# Patient Record
Sex: Female | Born: 1935
Health system: Southern US, Community
[De-identification: ages and names within clinical notes are randomized; demographics above are authoritative.]

## PROBLEM LIST (undated history)

## (undated) ENCOUNTER — Ambulatory Visit (HOSPITAL_COMMUNITY): Payer: Medicare Other | Source: Home / Self Care

## (undated) DIAGNOSIS — R011 Cardiac murmur, unspecified: Secondary | ICD-10-CM

## (undated) DIAGNOSIS — T7840XA Allergy, unspecified, initial encounter: Secondary | ICD-10-CM

## (undated) DIAGNOSIS — E559 Vitamin D deficiency, unspecified: Secondary | ICD-10-CM

## (undated) DIAGNOSIS — N189 Chronic kidney disease, unspecified: Secondary | ICD-10-CM

## (undated) DIAGNOSIS — G709 Myoneural disorder, unspecified: Secondary | ICD-10-CM

## (undated) DIAGNOSIS — M1990 Unspecified osteoarthritis, unspecified site: Secondary | ICD-10-CM

## (undated) DIAGNOSIS — I739 Peripheral vascular disease, unspecified: Secondary | ICD-10-CM

## (undated) DIAGNOSIS — K219 Gastro-esophageal reflux disease without esophagitis: Secondary | ICD-10-CM

## (undated) DIAGNOSIS — G4730 Sleep apnea, unspecified: Secondary | ICD-10-CM

## (undated) DIAGNOSIS — I10 Essential (primary) hypertension: Secondary | ICD-10-CM

## (undated) HISTORY — DX: Myoneural disorder, unspecified: G70.9

## (undated) HISTORY — PX: HEMORRHOID SURGERY: SHX153

## (undated) HISTORY — DX: Unspecified osteoarthritis, unspecified site: M19.90

## (undated) HISTORY — DX: Gastro-esophageal reflux disease without esophagitis: K21.9

## (undated) HISTORY — DX: Chronic kidney disease, unspecified: N18.9

## (undated) HISTORY — DX: Peripheral vascular disease, unspecified: I73.9

## (undated) HISTORY — PX: EYE SURGERY: SHX253

## (undated) HISTORY — DX: Vitamin D deficiency, unspecified: E55.9

## (undated) HISTORY — DX: Cardiac murmur, unspecified: R01.1

## (undated) HISTORY — DX: Sleep apnea, unspecified: G47.30

## (undated) HISTORY — DX: Allergy, unspecified, initial encounter: T78.40XA

## (undated) HISTORY — PX: ABDOMINAL HYSTERECTOMY: SHX81

---

## 1998-04-22 ENCOUNTER — Other Ambulatory Visit: Admission: RE | Admit: 1998-04-22 | Discharge: 1998-04-22 | Payer: Self-pay | Admitting: Gastroenterology

## 1998-07-09 ENCOUNTER — Ambulatory Visit (HOSPITAL_COMMUNITY): Admission: RE | Admit: 1998-07-09 | Discharge: 1998-07-09 | Payer: Self-pay | Admitting: Gastroenterology

## 2000-04-28 ENCOUNTER — Encounter: Payer: Self-pay | Admitting: *Deleted

## 2000-04-28 ENCOUNTER — Encounter: Admission: RE | Admit: 2000-04-28 | Discharge: 2000-04-28 | Payer: Self-pay | Admitting: *Deleted

## 2000-05-25 ENCOUNTER — Emergency Department (HOSPITAL_COMMUNITY): Admission: EM | Admit: 2000-05-25 | Discharge: 2000-05-25 | Payer: Self-pay | Admitting: Emergency Medicine

## 2000-10-27 ENCOUNTER — Encounter: Admission: RE | Admit: 2000-10-27 | Discharge: 2000-10-27 | Payer: Self-pay | Admitting: *Deleted

## 2000-10-27 ENCOUNTER — Encounter: Payer: Self-pay | Admitting: *Deleted

## 2001-05-30 ENCOUNTER — Encounter: Admission: RE | Admit: 2001-05-30 | Discharge: 2001-05-30 | Payer: Self-pay | Admitting: Internal Medicine

## 2001-05-30 ENCOUNTER — Encounter: Payer: Self-pay | Admitting: Internal Medicine

## 2001-11-27 ENCOUNTER — Ambulatory Visit (HOSPITAL_COMMUNITY): Admission: RE | Admit: 2001-11-27 | Discharge: 2001-11-27 | Payer: Self-pay | Admitting: Gastroenterology

## 2002-06-28 ENCOUNTER — Encounter: Payer: Self-pay | Admitting: Internal Medicine

## 2002-06-28 ENCOUNTER — Encounter: Admission: RE | Admit: 2002-06-28 | Discharge: 2002-06-28 | Payer: Self-pay | Admitting: Internal Medicine

## 2003-06-10 ENCOUNTER — Encounter: Payer: Self-pay | Admitting: Internal Medicine

## 2003-06-10 ENCOUNTER — Encounter: Admission: RE | Admit: 2003-06-10 | Discharge: 2003-06-10 | Payer: Self-pay | Admitting: Internal Medicine

## 2003-07-02 ENCOUNTER — Encounter: Admission: RE | Admit: 2003-07-02 | Discharge: 2003-07-02 | Payer: Self-pay | Admitting: Internal Medicine

## 2003-07-02 ENCOUNTER — Encounter: Payer: Self-pay | Admitting: Internal Medicine

## 2004-08-28 ENCOUNTER — Encounter: Admission: RE | Admit: 2004-08-28 | Discharge: 2004-08-28 | Payer: Self-pay | Admitting: Internal Medicine

## 2005-10-06 ENCOUNTER — Encounter: Admission: RE | Admit: 2005-10-06 | Discharge: 2005-10-06 | Payer: Self-pay | Admitting: Internal Medicine

## 2005-11-22 ENCOUNTER — Encounter: Admission: RE | Admit: 2005-11-22 | Discharge: 2005-11-22 | Payer: Self-pay | Admitting: Internal Medicine

## 2005-12-09 ENCOUNTER — Encounter: Admission: RE | Admit: 2005-12-09 | Discharge: 2005-12-09 | Payer: Self-pay | Admitting: Internal Medicine

## 2006-10-12 ENCOUNTER — Encounter: Admission: RE | Admit: 2006-10-12 | Discharge: 2006-10-12 | Payer: Self-pay | Admitting: Internal Medicine

## 2007-10-16 ENCOUNTER — Encounter: Admission: RE | Admit: 2007-10-16 | Discharge: 2007-10-16 | Payer: Self-pay | Admitting: Internal Medicine

## 2008-11-12 ENCOUNTER — Encounter: Admission: RE | Admit: 2008-11-12 | Discharge: 2008-11-12 | Payer: Self-pay | Admitting: Internal Medicine

## 2009-11-14 ENCOUNTER — Encounter: Admission: RE | Admit: 2009-11-14 | Discharge: 2009-11-14 | Payer: Self-pay | Admitting: Internal Medicine

## 2010-11-16 ENCOUNTER — Encounter: Admission: RE | Admit: 2010-11-16 | Discharge: 2010-11-16 | Payer: Self-pay | Admitting: Internal Medicine

## 2011-01-02 ENCOUNTER — Encounter: Payer: Self-pay | Admitting: Internal Medicine

## 2011-04-30 NOTE — Op Note (Signed)
[facility]  Patient:    Tricia Ferguson, Tricia Ferguson Visit Number: [id number] MRN: [id number]          Service Type: END Location: ENDO Attending Physician:  Louie Bun Dictated by:   Everardo All Madilyn Fireman, M.D. Proc. Date: 11/27/01 Admit Date:  11/27/2001   CC:         Lilly Cove, M.D.   Operative Report  PROCEDURE:  Colonoscopy.  INDICATIONS:  History of adenomatous colon polyps on initial colonoscopy three years ago.  DESCRIPTION OF PROCEDURE:  The patient was placed in the left lateral decubitus position and placed on the pulse monitor with continuous low flow oxygen delivered by nasal cannula. She was sedated with 60 mg IV Demerol and 10 mg IV Versed. Olympus videocolonoscope was inserted into the rectum and advanced to the cecum, confirmed by a transillumination at McBurneys point and visualization of the ileocecal valve and appendiceal orifice. The prep was good. The cecum, ascending, transverse, descending, and sigmoid colon all appeared normal with no masses, polyps, diverticula, or other mucosal abnormalities. The rectum likewise appeared normal. In retroflex view the anus did reveals some small internal hemorrhoids with no stigma of hemorrhage. The colonoscope was then withdrawn and the patient returned to the recovery room in stable condition. She tolerated the procedure well and there were no immediate complications.  IMPRESSION: 1. Internal hemorrhoids. 2. Otherwise normal colonoscopy.  PLAN:  Repeat colonoscopy in five years. Dictated by:   Everardo All Madilyn Fireman, M.D.  Attending Physician:  Louie Bun DD:  11/27/01 TD:  11/27/01 Job: [id number]

## 2011-10-25 ENCOUNTER — Other Ambulatory Visit: Payer: Self-pay | Admitting: Internal Medicine

## 2011-10-25 DIAGNOSIS — Z1231 Encounter for screening mammogram for malignant neoplasm of breast: Secondary | ICD-10-CM

## 2011-11-18 ENCOUNTER — Ambulatory Visit
Admission: RE | Admit: 2011-11-18 | Discharge: 2011-11-18 | Disposition: A | Discharge status: Home / Self Care | Payer: Medicare Other | Source: Ambulatory Visit | Attending: Internal Medicine | Admitting: Internal Medicine

## 2011-11-18 DIAGNOSIS — Z1231 Encounter for screening mammogram for malignant neoplasm of breast: Secondary | ICD-10-CM

## 2011-12-08 ENCOUNTER — Ambulatory Visit: Payer: Self-pay

## 2011-12-10 ENCOUNTER — Ambulatory Visit: Payer: Self-pay

## 2012-10-20 ENCOUNTER — Other Ambulatory Visit: Payer: Self-pay | Admitting: Internal Medicine

## 2012-10-20 DIAGNOSIS — Z1231 Encounter for screening mammogram for malignant neoplasm of breast: Secondary | ICD-10-CM

## 2012-11-29 ENCOUNTER — Ambulatory Visit
Admission: RE | Admit: 2012-11-29 | Discharge: 2012-11-29 | Disposition: A | Discharge status: Home / Self Care | Payer: Medicare Other | Source: Ambulatory Visit | Attending: Internal Medicine | Admitting: Internal Medicine

## 2012-11-29 DIAGNOSIS — Z1231 Encounter for screening mammogram for malignant neoplasm of breast: Secondary | ICD-10-CM

## 2013-04-06 ENCOUNTER — Ambulatory Visit
Admission: RE | Admit: 2013-04-06 | Discharge: 2013-04-06 | Disposition: A | Discharge status: Home / Self Care | Payer: Medicare Other | Source: Ambulatory Visit | Attending: Internal Medicine | Admitting: Internal Medicine

## 2013-04-06 ENCOUNTER — Other Ambulatory Visit: Payer: Self-pay | Admitting: Internal Medicine

## 2013-04-06 DIAGNOSIS — M549 Dorsalgia, unspecified: Secondary | ICD-10-CM

## 2013-04-06 DIAGNOSIS — M542 Cervicalgia: Secondary | ICD-10-CM

## 2013-04-17 ENCOUNTER — Other Ambulatory Visit: Payer: Self-pay | Admitting: Internal Medicine

## 2013-04-17 DIAGNOSIS — R3129 Other microscopic hematuria: Secondary | ICD-10-CM

## 2013-04-25 ENCOUNTER — Ambulatory Visit
Admission: RE | Admit: 2013-04-25 | Discharge: 2013-04-25 | Disposition: A | Discharge status: Home / Self Care | Payer: Medicare Other | Source: Ambulatory Visit | Attending: Internal Medicine | Admitting: Internal Medicine

## 2013-04-25 DIAGNOSIS — R3129 Other microscopic hematuria: Secondary | ICD-10-CM

## 2013-06-13 ENCOUNTER — Emergency Department (INDEPENDENT_AMBULATORY_CARE_PROVIDER_SITE_OTHER)
Admission: EM | Admit: 2013-06-13 | Discharge: 2013-06-13 | Disposition: A | Discharge status: Home / Self Care | Payer: Medicare Other | Source: Home / Self Care | Attending: Emergency Medicine | Admitting: Emergency Medicine

## 2013-06-13 ENCOUNTER — Encounter (HOSPITAL_COMMUNITY): Payer: Self-pay | Admitting: Emergency Medicine

## 2013-06-13 DIAGNOSIS — J029 Acute pharyngitis, unspecified: Secondary | ICD-10-CM

## 2013-06-13 HISTORY — DX: Essential (primary) hypertension: I10

## 2013-06-13 MED ORDER — AMOXICILLIN 500 MG PO CAPS: 500.0000 mg | ORAL_CAPSULE | Freq: Three times a day (TID) | ORAL | Status: DC

## 2013-06-13 MED ORDER — AMOXICILLIN 250 MG/5ML PO SUSR: ORAL | Status: DC

## 2013-06-13 NOTE — ED Notes (Signed)
Reports sore throat that started Sunday.  Reports minimal cough, no ear pain, feels funny below ears.  Reports a lot of phlegm in throat.

## 2013-06-13 NOTE — ED Provider Notes (Signed)
   History    CSN: [id number] Arrival date & time 06/13/13  1623  First MD Initiated Contact with Patient 06/13/13 1737     Chief Complaint  Patient presents with  . Sore Throat   (Consider location/radiation/quality/duration/timing/severity/associated sxs/prior Treatment) Patient is a 79 y.o. female presenting with pharyngitis. The history is provided by the patient. No language interpreter was used.  Sore Throat This is a new problem. The current episode started 2 days ago. The problem occurs constantly. The problem has been gradually worsening. Associated symptoms include abdominal pain. Nothing aggravates the symptoms. Nothing relieves the symptoms. She has tried nothing for the symptoms. The treatment provided no relief.   Past Medical History  Diagnosis Date  . Hypertension    Past Surgical History  Procedure Laterality Date  . Abdominal hysterectomy    . Hemorrhoid surgery     No family history on file. History  Substance Use Topics  . Smoking status: Never Smoker   . Smokeless tobacco: Not on file  . Alcohol Use: No   OB History   Grav Para Term Preterm Abortions TAB SAB Ect Mult Living                 Review of Systems  Gastrointestinal: Positive for abdominal pain.  All other systems reviewed and are negative.    Allergies  Review of patient's allergies indicates no known allergies.  Home Medications   Current Outpatient Rx  Name  Route  Sig  Dispense  Refill  . Ascorbic Acid (VITAMIN C PO)   Oral   Take by mouth.         . Cholecalciferol (VITAMIN D PO)   Oral   Take by mouth.         . fish oil-omega-3 fatty acids 1000 MG capsule   Oral   Take 2 g by mouth daily.         Marland Kitchen lisinopril (PRINIVIL,ZESTRIL) 5 MG tablet   Oral   Take 5 mg by mouth daily.         . Multiple Vitamin (MULTIVITAMIN) tablet   Oral   Take 1 tablet by mouth daily.          BP 153/86  Pulse 73  Temp(Src) 98.2 F (36.8 C) (Oral)  Resp 18  SpO2  100% Physical Exam  Nursing note and vitals reviewed. Constitutional: She appears well-developed.  HENT:  Head: Normocephalic and atraumatic.  Eyes: Pupils are equal, round, and reactive to light.  Erythema throat,  No exudate  Neck: Normal range of motion.  Cardiovascular: Normal rate and regular rhythm.   Pulmonary/Chest: Effort normal.  Abdominal: Soft.  Musculoskeletal: Normal range of motion.  Neurological: She is alert.  Skin: Skin is warm.  Psychiatric: She has a normal mood and affect.    ED Course  Procedures (including critical care time) Labs Reviewed  CULTURE, GROUP A STREP   No results found. 1. Pharyngitis     MDM  amoxicillian   Elson Areas, PA-C 06/13/13 1927

## 2013-06-13 NOTE — Discharge Instructions (Signed)
Sore Throat A sore throat is pain, burning, irritation, or scratchiness of the throat. There is often pain or tenderness when swallowing or talking. A sore throat may be accompanied by other symptoms, such as coughing, sneezing, fever, and swollen neck glands. A sore throat is often the first sign of another sickness, such as a cold, flu, strep throat, or mononucleosis (commonly known as mono). Most sore throats go away without medical treatment. CAUSES  The most common causes of a sore throat include:  A viral infection, such as a cold, flu, or mono.  A bacterial infection, such as strep throat, tonsillitis, or whooping cough.  Seasonal allergies.  Dryness in the air.  Irritants, such as smoke or pollution.  Gastroesophageal reflux disease (GERD). HOME CARE INSTRUCTIONS   Only take over-the-counter medicines as directed by your caregiver.  Drink enough fluids to keep your urine clear or pale yellow.  Rest as needed.  Try using throat sprays, lozenges, or sucking on hard candy to ease any pain (if older than 4 years or as directed).  Sip warm liquids, such as broth, herbal tea, or warm water with honey to relieve pain temporarily. You may also eat or drink cold or frozen liquids such as frozen ice pops.  Gargle with salt water (mix 1 tsp salt with 8 oz of water).  Do not smoke and avoid secondhand smoke.  Put a cool-mist humidifier in your bedroom at night to moisten the air. You can also turn on a hot shower and sit in the bathroom with the door closed for 5 10 minutes. SEEK IMMEDIATE MEDICAL CARE IF:  You have difficulty breathing.  You are unable to swallow fluids, soft foods, or your saliva.  You have increased swelling in the throat.  Your sore throat does not get better in 7 days.  You have nausea and vomiting.  You have a fever or persistent symptoms for more than 2 3 days.  You have a fever and your symptoms suddenly get worse. MAKE SURE YOU:   Understand  these instructions.  Will watch your condition.  Will get help right away if you are not doing well or get worse. Document Released: 01/06/2005 Document Revised: 11/15/2012 Document Reviewed: 08/06/2012 Exit[employer] Patient Information 2014 [employer].  

## 2013-06-13 NOTE — ED Provider Notes (Signed)
Medical screening examination/treatment/procedure(s) were performed by non-physician practitioner and as supervising physician I was immediately available for consultation/collaboration.  Leslee Home, M.D.  Reuben Likes, MD 06/13/13 [phone]

## 2013-06-14 LAB — POCT RAPID STREP A: Streptococcus, Group A Screen (Direct): NEGATIVE

## 2013-06-15 LAB — CULTURE, GROUP A STREP

## 2013-11-19 ENCOUNTER — Other Ambulatory Visit: Payer: Self-pay

## 2013-11-19 DIAGNOSIS — Z1231 Encounter for screening mammogram for malignant neoplasm of breast: Secondary | ICD-10-CM

## 2013-12-18 ENCOUNTER — Ambulatory Visit
Admission: RE | Admit: 2013-12-18 | Discharge: 2013-12-18 | Disposition: A | Discharge status: Home / Self Care | Payer: Medicare Other | Source: Ambulatory Visit

## 2013-12-18 DIAGNOSIS — Z1231 Encounter for screening mammogram for malignant neoplasm of breast: Secondary | ICD-10-CM

## 2014-06-10 ENCOUNTER — Other Ambulatory Visit: Payer: Self-pay | Admitting: Internal Medicine

## 2014-06-10 DIAGNOSIS — I739 Peripheral vascular disease, unspecified: Secondary | ICD-10-CM

## 2014-06-12 ENCOUNTER — Ambulatory Visit
Admission: RE | Admit: 2014-06-12 | Discharge: 2014-06-12 | Disposition: A | Discharge status: Home / Self Care | Payer: Medicare Other | Source: Ambulatory Visit | Attending: Internal Medicine | Admitting: Internal Medicine

## 2014-06-12 DIAGNOSIS — I739 Peripheral vascular disease, unspecified: Secondary | ICD-10-CM

## 2014-12-10 ENCOUNTER — Other Ambulatory Visit: Payer: Self-pay

## 2014-12-10 DIAGNOSIS — Z1231 Encounter for screening mammogram for malignant neoplasm of breast: Secondary | ICD-10-CM

## 2014-12-19 ENCOUNTER — Ambulatory Visit
Admission: RE | Admit: 2014-12-19 | Discharge: 2014-12-19 | Disposition: A | Discharge status: Home / Self Care | Payer: Medicare Other | Source: Ambulatory Visit

## 2014-12-19 DIAGNOSIS — Z1231 Encounter for screening mammogram for malignant neoplasm of breast: Secondary | ICD-10-CM

## 2015-11-24 ENCOUNTER — Other Ambulatory Visit: Payer: Self-pay

## 2015-11-24 DIAGNOSIS — Z1231 Encounter for screening mammogram for malignant neoplasm of breast: Secondary | ICD-10-CM

## 2015-12-23 ENCOUNTER — Ambulatory Visit: Payer: Medicare Other

## 2015-12-31 ENCOUNTER — Ambulatory Visit
Admission: RE | Admit: 2015-12-31 | Discharge: 2015-12-31 | Disposition: A | Discharge status: Home / Self Care | Payer: Medicare Other | Source: Ambulatory Visit

## 2015-12-31 DIAGNOSIS — Z1231 Encounter for screening mammogram for malignant neoplasm of breast: Secondary | ICD-10-CM

## 2016-02-04 DIAGNOSIS — M799 Soft tissue disorder, unspecified: Secondary | ICD-10-CM | POA: Diagnosis not present

## 2016-02-04 DIAGNOSIS — I8312 Varicose veins of left lower extremity with inflammation: Secondary | ICD-10-CM | POA: Diagnosis not present

## 2016-02-04 DIAGNOSIS — N182 Chronic kidney disease, stage 2 (mild): Secondary | ICD-10-CM | POA: Diagnosis not present

## 2016-02-04 DIAGNOSIS — I129 Hypertensive chronic kidney disease with stage 1 through stage 4 chronic kidney disease, or unspecified chronic kidney disease: Secondary | ICD-10-CM | POA: Diagnosis not present

## 2016-02-06 DIAGNOSIS — J018 Other acute sinusitis: Secondary | ICD-10-CM | POA: Diagnosis not present

## 2016-02-06 DIAGNOSIS — I10 Essential (primary) hypertension: Secondary | ICD-10-CM | POA: Diagnosis not present

## 2016-02-10 DIAGNOSIS — M7989 Other specified soft tissue disorders: Secondary | ICD-10-CM | POA: Diagnosis not present

## 2016-02-10 DIAGNOSIS — L03116 Cellulitis of left lower limb: Secondary | ICD-10-CM | POA: Diagnosis not present

## 2016-02-10 DIAGNOSIS — N182 Chronic kidney disease, stage 2 (mild): Secondary | ICD-10-CM | POA: Diagnosis not present

## 2016-02-10 DIAGNOSIS — I129 Hypertensive chronic kidney disease with stage 1 through stage 4 chronic kidney disease, or unspecified chronic kidney disease: Secondary | ICD-10-CM | POA: Diagnosis not present

## 2016-02-18 DIAGNOSIS — L299 Pruritus, unspecified: Secondary | ICD-10-CM | POA: Diagnosis not present

## 2016-02-18 DIAGNOSIS — L03116 Cellulitis of left lower limb: Secondary | ICD-10-CM | POA: Diagnosis not present

## 2016-02-18 DIAGNOSIS — R945 Abnormal results of liver function studies: Secondary | ICD-10-CM | POA: Diagnosis not present

## 2016-02-18 DIAGNOSIS — Z880 Allergy status to penicillin: Secondary | ICD-10-CM | POA: Diagnosis not present

## 2016-02-18 DIAGNOSIS — K5190 Ulcerative colitis, unspecified, without complications: Secondary | ICD-10-CM | POA: Diagnosis not present

## 2016-02-19 ENCOUNTER — Other Ambulatory Visit: Payer: Self-pay | Admitting: Internal Medicine

## 2016-02-19 DIAGNOSIS — R7989 Other specified abnormal findings of blood chemistry: Secondary | ICD-10-CM

## 2016-02-19 DIAGNOSIS — R945 Abnormal results of liver function studies: Secondary | ICD-10-CM | POA: Diagnosis not present

## 2016-02-20 ENCOUNTER — Encounter: Payer: Self-pay | Admitting: Vascular Surgery

## 2016-02-20 ENCOUNTER — Other Ambulatory Visit: Payer: Self-pay

## 2016-02-20 DIAGNOSIS — I83892 Varicose veins of left lower extremities with other complications: Secondary | ICD-10-CM

## 2016-02-24 ENCOUNTER — Other Ambulatory Visit: Payer: Medicare Other

## 2016-03-03 DIAGNOSIS — Z0100 Encounter for examination of eyes and vision without abnormal findings: Secondary | ICD-10-CM | POA: Diagnosis not present

## 2016-03-03 DIAGNOSIS — R7309 Other abnormal glucose: Secondary | ICD-10-CM | POA: Diagnosis not present

## 2016-03-03 DIAGNOSIS — I129 Hypertensive chronic kidney disease with stage 1 through stage 4 chronic kidney disease, or unspecified chronic kidney disease: Secondary | ICD-10-CM | POA: Diagnosis not present

## 2016-03-03 DIAGNOSIS — Z0000 Encounter for general adult medical examination without abnormal findings: Secondary | ICD-10-CM | POA: Diagnosis not present

## 2016-03-03 DIAGNOSIS — E559 Vitamin D deficiency, unspecified: Secondary | ICD-10-CM | POA: Diagnosis not present

## 2016-03-03 DIAGNOSIS — N182 Chronic kidney disease, stage 2 (mild): Secondary | ICD-10-CM | POA: Diagnosis not present

## 2016-03-03 DIAGNOSIS — Z01118 Encounter for examination of ears and hearing with other abnormal findings: Secondary | ICD-10-CM | POA: Diagnosis not present

## 2016-03-03 DIAGNOSIS — R945 Abnormal results of liver function studies: Secondary | ICD-10-CM | POA: Diagnosis not present

## 2016-03-04 ENCOUNTER — Ambulatory Visit
Admission: RE | Admit: 2016-03-04 | Discharge: 2016-03-04 | Disposition: A | Discharge status: Home / Self Care | Payer: Medicare Other | Source: Ambulatory Visit | Attending: Internal Medicine | Admitting: Internal Medicine

## 2016-03-04 DIAGNOSIS — R945 Abnormal results of liver function studies: Principal | ICD-10-CM

## 2016-03-04 DIAGNOSIS — R7989 Other specified abnormal findings of blood chemistry: Secondary | ICD-10-CM

## 2016-03-04 DIAGNOSIS — K7689 Other specified diseases of liver: Secondary | ICD-10-CM | POA: Diagnosis not present

## 2016-03-10 ENCOUNTER — Other Ambulatory Visit: Payer: Self-pay | Admitting: Internal Medicine

## 2016-03-10 DIAGNOSIS — R945 Abnormal results of liver function studies: Secondary | ICD-10-CM

## 2016-03-18 ENCOUNTER — Inpatient Hospital Stay: Admission: RE | Admit: 2016-03-18 | Payer: Medicare Other | Source: Ambulatory Visit

## 2016-03-31 ENCOUNTER — Encounter: Payer: Self-pay | Admitting: Vascular Surgery

## 2016-04-05 ENCOUNTER — Ambulatory Visit
Admission: RE | Admit: 2016-04-05 | Discharge: 2016-04-05 | Disposition: A | Discharge status: Home / Self Care | Payer: Medicare Other | Source: Ambulatory Visit | Attending: Internal Medicine | Admitting: Internal Medicine

## 2016-04-05 DIAGNOSIS — R945 Abnormal results of liver function studies: Secondary | ICD-10-CM

## 2016-04-05 DIAGNOSIS — K7689 Other specified diseases of liver: Secondary | ICD-10-CM | POA: Diagnosis not present

## 2016-04-05 MED ORDER — GADOBENATE DIMEGLUMINE 529 MG/ML IV SOLN
19.0000 mL | Freq: Once | INTRAVENOUS | Status: AC | PRN
  Administered 2016-04-05: 19 mL via INTRAVENOUS

## 2016-04-06 ENCOUNTER — Encounter: Payer: Medicare Other | Admitting: Vascular Surgery

## 2016-04-06 ENCOUNTER — Encounter (HOSPITAL_COMMUNITY): Payer: Medicare Other

## 2016-04-06 ENCOUNTER — Ambulatory Visit (HOSPITAL_COMMUNITY): Admission: RE | Admit: 2016-04-06 | Payer: Medicare Other | Source: Ambulatory Visit

## 2016-04-06 DIAGNOSIS — H353221 Exudative age-related macular degeneration, left eye, with active choroidal neovascularization: Secondary | ICD-10-CM | POA: Diagnosis not present

## 2016-04-28 DIAGNOSIS — R748 Abnormal levels of other serum enzymes: Secondary | ICD-10-CM | POA: Diagnosis not present

## 2016-08-10 DIAGNOSIS — H353222 Exudative age-related macular degeneration, left eye, with inactive choroidal neovascularization: Secondary | ICD-10-CM | POA: Diagnosis not present

## 2016-08-18 DIAGNOSIS — N182 Chronic kidney disease, stage 2 (mild): Secondary | ICD-10-CM | POA: Diagnosis not present

## 2016-08-18 DIAGNOSIS — R7309 Other abnormal glucose: Secondary | ICD-10-CM | POA: Diagnosis not present

## 2016-08-18 DIAGNOSIS — M2550 Pain in unspecified joint: Secondary | ICD-10-CM | POA: Diagnosis not present

## 2016-08-18 DIAGNOSIS — R945 Abnormal results of liver function studies: Secondary | ICD-10-CM | POA: Diagnosis not present

## 2016-08-18 DIAGNOSIS — I129 Hypertensive chronic kidney disease with stage 1 through stage 4 chronic kidney disease, or unspecified chronic kidney disease: Secondary | ICD-10-CM | POA: Diagnosis not present

## 2016-08-18 DIAGNOSIS — R413 Other amnesia: Secondary | ICD-10-CM | POA: Diagnosis not present

## 2016-09-13 DIAGNOSIS — Z23 Encounter for immunization: Secondary | ICD-10-CM | POA: Diagnosis not present

## 2016-09-27 DIAGNOSIS — H524 Presbyopia: Secondary | ICD-10-CM | POA: Diagnosis not present

## 2016-12-03 ENCOUNTER — Other Ambulatory Visit: Payer: Self-pay | Admitting: Internal Medicine

## 2016-12-03 DIAGNOSIS — Z1231 Encounter for screening mammogram for malignant neoplasm of breast: Secondary | ICD-10-CM

## 2016-12-08 DIAGNOSIS — H353222 Exudative age-related macular degeneration, left eye, with inactive choroidal neovascularization: Secondary | ICD-10-CM | POA: Diagnosis not present

## 2016-12-08 DIAGNOSIS — H353111 Nonexudative age-related macular degeneration, right eye, early dry stage: Secondary | ICD-10-CM | POA: Diagnosis not present

## 2017-01-03 ENCOUNTER — Ambulatory Visit
Admission: RE | Admit: 2017-01-03 | Discharge: 2017-01-03 | Disposition: A | Discharge status: Home / Self Care | Payer: Medicare Other | Source: Ambulatory Visit | Attending: Internal Medicine | Admitting: Internal Medicine

## 2017-01-03 DIAGNOSIS — Z1231 Encounter for screening mammogram for malignant neoplasm of breast: Secondary | ICD-10-CM | POA: Diagnosis not present

## 2017-01-05 DIAGNOSIS — N182 Chronic kidney disease, stage 2 (mild): Secondary | ICD-10-CM | POA: Diagnosis not present

## 2017-01-05 DIAGNOSIS — J309 Allergic rhinitis, unspecified: Secondary | ICD-10-CM | POA: Diagnosis not present

## 2017-01-05 DIAGNOSIS — R42 Dizziness and giddiness: Secondary | ICD-10-CM | POA: Diagnosis not present

## 2017-01-05 DIAGNOSIS — I129 Hypertensive chronic kidney disease with stage 1 through stage 4 chronic kidney disease, or unspecified chronic kidney disease: Secondary | ICD-10-CM | POA: Diagnosis not present

## 2017-01-12 ENCOUNTER — Ambulatory Visit
Admission: RE | Admit: 2017-01-12 | Discharge: 2017-01-12 | Disposition: A | Discharge status: Home / Self Care | Payer: Medicare Other | Source: Ambulatory Visit | Attending: Nurse Practitioner | Admitting: Nurse Practitioner

## 2017-01-12 ENCOUNTER — Other Ambulatory Visit: Payer: Self-pay | Admitting: Nurse Practitioner

## 2017-01-12 DIAGNOSIS — R42 Dizziness and giddiness: Secondary | ICD-10-CM

## 2017-01-12 DIAGNOSIS — R51 Headache: Secondary | ICD-10-CM | POA: Diagnosis not present

## 2017-02-14 DIAGNOSIS — I129 Hypertensive chronic kidney disease with stage 1 through stage 4 chronic kidney disease, or unspecified chronic kidney disease: Secondary | ICD-10-CM | POA: Diagnosis not present

## 2017-02-14 DIAGNOSIS — R002 Palpitations: Secondary | ICD-10-CM | POA: Diagnosis not present

## 2017-02-14 DIAGNOSIS — R42 Dizziness and giddiness: Secondary | ICD-10-CM | POA: Diagnosis not present

## 2017-02-14 DIAGNOSIS — N182 Chronic kidney disease, stage 2 (mild): Secondary | ICD-10-CM | POA: Diagnosis not present

## 2017-04-12 DIAGNOSIS — H43813 Vitreous degeneration, bilateral: Secondary | ICD-10-CM | POA: Diagnosis not present

## 2017-04-12 DIAGNOSIS — H353111 Nonexudative age-related macular degeneration, right eye, early dry stage: Secondary | ICD-10-CM | POA: Diagnosis not present

## 2017-04-12 DIAGNOSIS — H353221 Exudative age-related macular degeneration, left eye, with active choroidal neovascularization: Secondary | ICD-10-CM | POA: Diagnosis not present

## 2017-04-27 DIAGNOSIS — R7309 Other abnormal glucose: Secondary | ICD-10-CM | POA: Diagnosis not present

## 2017-04-27 DIAGNOSIS — Z0000 Encounter for general adult medical examination without abnormal findings: Secondary | ICD-10-CM | POA: Diagnosis not present

## 2017-04-27 DIAGNOSIS — I129 Hypertensive chronic kidney disease with stage 1 through stage 4 chronic kidney disease, or unspecified chronic kidney disease: Secondary | ICD-10-CM | POA: Diagnosis not present

## 2017-04-27 DIAGNOSIS — N182 Chronic kidney disease, stage 2 (mild): Secondary | ICD-10-CM | POA: Diagnosis not present

## 2017-04-27 DIAGNOSIS — Z23 Encounter for immunization: Secondary | ICD-10-CM | POA: Diagnosis not present

## 2017-04-27 DIAGNOSIS — E559 Vitamin D deficiency, unspecified: Secondary | ICD-10-CM | POA: Diagnosis not present

## 2017-05-20 DIAGNOSIS — R262 Difficulty in walking, not elsewhere classified: Secondary | ICD-10-CM | POA: Diagnosis not present

## 2017-05-20 DIAGNOSIS — M5441 Lumbago with sciatica, right side: Secondary | ICD-10-CM | POA: Diagnosis not present

## 2017-05-20 DIAGNOSIS — M6281 Muscle weakness (generalized): Secondary | ICD-10-CM | POA: Diagnosis not present

## 2017-05-27 DIAGNOSIS — M6281 Muscle weakness (generalized): Secondary | ICD-10-CM | POA: Diagnosis not present

## 2017-05-27 DIAGNOSIS — R262 Difficulty in walking, not elsewhere classified: Secondary | ICD-10-CM | POA: Diagnosis not present

## 2017-05-27 DIAGNOSIS — M5441 Lumbago with sciatica, right side: Secondary | ICD-10-CM | POA: Diagnosis not present

## 2017-05-30 DIAGNOSIS — R262 Difficulty in walking, not elsewhere classified: Secondary | ICD-10-CM | POA: Diagnosis not present

## 2017-05-30 DIAGNOSIS — M5441 Lumbago with sciatica, right side: Secondary | ICD-10-CM | POA: Diagnosis not present

## 2017-05-30 DIAGNOSIS — M6281 Muscle weakness (generalized): Secondary | ICD-10-CM | POA: Diagnosis not present

## 2017-06-01 DIAGNOSIS — M5441 Lumbago with sciatica, right side: Secondary | ICD-10-CM | POA: Diagnosis not present

## 2017-06-01 DIAGNOSIS — M6281 Muscle weakness (generalized): Secondary | ICD-10-CM | POA: Diagnosis not present

## 2017-06-01 DIAGNOSIS — R262 Difficulty in walking, not elsewhere classified: Secondary | ICD-10-CM | POA: Diagnosis not present

## 2017-06-13 DIAGNOSIS — R262 Difficulty in walking, not elsewhere classified: Secondary | ICD-10-CM | POA: Diagnosis not present

## 2017-06-13 DIAGNOSIS — M6281 Muscle weakness (generalized): Secondary | ICD-10-CM | POA: Diagnosis not present

## 2017-06-13 DIAGNOSIS — M5441 Lumbago with sciatica, right side: Secondary | ICD-10-CM | POA: Diagnosis not present

## 2017-06-20 DIAGNOSIS — M5441 Lumbago with sciatica, right side: Secondary | ICD-10-CM | POA: Diagnosis not present

## 2017-06-20 DIAGNOSIS — R262 Difficulty in walking, not elsewhere classified: Secondary | ICD-10-CM | POA: Diagnosis not present

## 2017-06-20 DIAGNOSIS — M6281 Muscle weakness (generalized): Secondary | ICD-10-CM | POA: Diagnosis not present

## 2017-06-22 DIAGNOSIS — M5441 Lumbago with sciatica, right side: Secondary | ICD-10-CM | POA: Diagnosis not present

## 2017-06-22 DIAGNOSIS — L03116 Cellulitis of left lower limb: Secondary | ICD-10-CM | POA: Diagnosis not present

## 2017-06-22 DIAGNOSIS — I872 Venous insufficiency (chronic) (peripheral): Secondary | ICD-10-CM | POA: Diagnosis not present

## 2017-06-22 DIAGNOSIS — M25572 Pain in left ankle and joints of left foot: Secondary | ICD-10-CM | POA: Diagnosis not present

## 2017-06-22 DIAGNOSIS — M6281 Muscle weakness (generalized): Secondary | ICD-10-CM | POA: Diagnosis not present

## 2017-06-22 DIAGNOSIS — M25552 Pain in left hip: Secondary | ICD-10-CM | POA: Diagnosis not present

## 2017-06-22 DIAGNOSIS — R262 Difficulty in walking, not elsewhere classified: Secondary | ICD-10-CM | POA: Diagnosis not present

## 2017-06-29 ENCOUNTER — Other Ambulatory Visit: Payer: Self-pay

## 2017-06-29 DIAGNOSIS — R262 Difficulty in walking, not elsewhere classified: Secondary | ICD-10-CM | POA: Diagnosis not present

## 2017-06-29 DIAGNOSIS — M6281 Muscle weakness (generalized): Secondary | ICD-10-CM | POA: Diagnosis not present

## 2017-06-29 DIAGNOSIS — M5441 Lumbago with sciatica, right side: Secondary | ICD-10-CM | POA: Diagnosis not present

## 2017-06-29 DIAGNOSIS — I872 Venous insufficiency (chronic) (peripheral): Secondary | ICD-10-CM

## 2017-07-06 DIAGNOSIS — R262 Difficulty in walking, not elsewhere classified: Secondary | ICD-10-CM | POA: Diagnosis not present

## 2017-07-06 DIAGNOSIS — M6281 Muscle weakness (generalized): Secondary | ICD-10-CM | POA: Diagnosis not present

## 2017-07-06 DIAGNOSIS — M5441 Lumbago with sciatica, right side: Secondary | ICD-10-CM | POA: Diagnosis not present

## 2017-07-11 DIAGNOSIS — Z6841 Body Mass Index (BMI) 40.0 and over, adult: Secondary | ICD-10-CM | POA: Diagnosis not present

## 2017-07-11 DIAGNOSIS — M25552 Pain in left hip: Secondary | ICD-10-CM | POA: Diagnosis not present

## 2017-07-12 ENCOUNTER — Other Ambulatory Visit (HOSPITAL_COMMUNITY): Payer: Self-pay | Admitting: Internal Medicine

## 2017-07-12 DIAGNOSIS — R609 Edema, unspecified: Secondary | ICD-10-CM

## 2017-07-13 ENCOUNTER — Encounter (HOSPITAL_COMMUNITY): Payer: Medicare Other

## 2017-07-13 DIAGNOSIS — M6281 Muscle weakness (generalized): Secondary | ICD-10-CM | POA: Diagnosis not present

## 2017-07-13 DIAGNOSIS — M5441 Lumbago with sciatica, right side: Secondary | ICD-10-CM | POA: Diagnosis not present

## 2017-07-13 DIAGNOSIS — R262 Difficulty in walking, not elsewhere classified: Secondary | ICD-10-CM | POA: Diagnosis not present

## 2017-07-18 ENCOUNTER — Ambulatory Visit (HOSPITAL_COMMUNITY)
Admission: RE | Admit: 2017-07-18 | Discharge: 2017-07-18 | Disposition: A | Discharge status: Home / Self Care | Payer: Medicare Other | Source: Ambulatory Visit | Attending: Cardiology | Admitting: Cardiology

## 2017-07-18 DIAGNOSIS — I872 Venous insufficiency (chronic) (peripheral): Secondary | ICD-10-CM | POA: Diagnosis present

## 2017-07-18 DIAGNOSIS — R609 Edema, unspecified: Secondary | ICD-10-CM

## 2017-07-27 DIAGNOSIS — M5441 Lumbago with sciatica, right side: Secondary | ICD-10-CM | POA: Diagnosis not present

## 2017-07-27 DIAGNOSIS — R262 Difficulty in walking, not elsewhere classified: Secondary | ICD-10-CM | POA: Diagnosis not present

## 2017-07-27 DIAGNOSIS — M6281 Muscle weakness (generalized): Secondary | ICD-10-CM | POA: Diagnosis not present

## 2017-08-01 DIAGNOSIS — M5416 Radiculopathy, lumbar region: Secondary | ICD-10-CM | POA: Diagnosis not present

## 2017-08-01 DIAGNOSIS — M48061 Spinal stenosis, lumbar region without neurogenic claudication: Secondary | ICD-10-CM | POA: Diagnosis not present

## 2017-08-03 DIAGNOSIS — R262 Difficulty in walking, not elsewhere classified: Secondary | ICD-10-CM | POA: Diagnosis not present

## 2017-08-03 DIAGNOSIS — M5441 Lumbago with sciatica, right side: Secondary | ICD-10-CM | POA: Diagnosis not present

## 2017-08-03 DIAGNOSIS — M6281 Muscle weakness (generalized): Secondary | ICD-10-CM | POA: Diagnosis not present

## 2017-08-10 ENCOUNTER — Encounter: Payer: Self-pay | Admitting: Surgery

## 2017-08-17 ENCOUNTER — Encounter: Payer: Medicare Other | Admitting: Surgery

## 2017-08-17 ENCOUNTER — Encounter (HOSPITAL_COMMUNITY): Payer: Medicare Other

## 2017-08-23 DIAGNOSIS — H43813 Vitreous degeneration, bilateral: Secondary | ICD-10-CM | POA: Diagnosis not present

## 2017-08-23 DIAGNOSIS — H353222 Exudative age-related macular degeneration, left eye, with inactive choroidal neovascularization: Secondary | ICD-10-CM | POA: Diagnosis not present

## 2017-08-23 DIAGNOSIS — H353111 Nonexudative age-related macular degeneration, right eye, early dry stage: Secondary | ICD-10-CM | POA: Diagnosis not present

## 2017-09-05 DIAGNOSIS — M48061 Spinal stenosis, lumbar region without neurogenic claudication: Secondary | ICD-10-CM | POA: Diagnosis not present

## 2017-09-05 DIAGNOSIS — M5416 Radiculopathy, lumbar region: Secondary | ICD-10-CM | POA: Diagnosis not present

## 2017-09-14 DIAGNOSIS — Z23 Encounter for immunization: Secondary | ICD-10-CM | POA: Diagnosis not present

## 2017-09-20 ENCOUNTER — Encounter (HOSPITAL_COMMUNITY): Payer: Medicare Other

## 2017-09-20 ENCOUNTER — Encounter: Payer: Medicare Other | Admitting: Vascular Surgery

## 2017-09-28 DIAGNOSIS — H52223 Regular astigmatism, bilateral: Secondary | ICD-10-CM | POA: Diagnosis not present

## 2017-09-29 ENCOUNTER — Ambulatory Visit (INDEPENDENT_AMBULATORY_CARE_PROVIDER_SITE_OTHER): Payer: Medicare Other | Admitting: Vascular Surgery

## 2017-09-29 ENCOUNTER — Ambulatory Visit (HOSPITAL_COMMUNITY)
Admission: RE | Admit: 2017-09-29 | Discharge: 2017-09-29 | Disposition: A | Discharge status: Home / Self Care | Payer: Medicare Other | Source: Ambulatory Visit | Attending: Vascular Surgery | Admitting: Vascular Surgery

## 2017-09-29 ENCOUNTER — Encounter: Payer: Self-pay | Admitting: Vascular Surgery

## 2017-09-29 VITALS — BP 144/75 | HR 75 | Temp 97.8°F | Resp 16 | Ht 61.0 in | Wt 207.0 lb

## 2017-09-29 DIAGNOSIS — I739 Peripheral vascular disease, unspecified: Secondary | ICD-10-CM | POA: Diagnosis not present

## 2017-09-29 DIAGNOSIS — M79604 Pain in right leg: Secondary | ICD-10-CM | POA: Diagnosis not present

## 2017-09-29 DIAGNOSIS — M79605 Pain in left leg: Secondary | ICD-10-CM

## 2017-09-29 DIAGNOSIS — M7989 Other specified soft tissue disorders: Secondary | ICD-10-CM | POA: Insufficient documentation

## 2017-09-29 DIAGNOSIS — I872 Venous insufficiency (chronic) (peripheral): Secondary | ICD-10-CM

## 2017-09-29 NOTE — Progress Notes (Signed)
HISTORY AND PHYSICAL     CC:  Leg pain Requesting Provider:  Dorothyann Peng, MD  HPI: This is a 83 y.o. female who is referred for leg pain.  She states that she has had pain in both hips-first the left hip and that improved and then the right hip.  She says she has pain down her right leg.  She states that she just doesn't get around like she feels she should.  She states that she has some pain and discoloration in her left ankle that she feels was from smoking in the past.  She states she does have some ankle swelling bilaterally.  She states that she can walk, but can walk about 25 ft before she has to sit down.  She states she has seen Dr. Netta Corrigan in the past and felt that her hip and leg problems were related to her back and felt that she would benefit from an injection in her back.  She was not willing to proceed.    She is on an ACEI for blood pressure management.  She has a remote tobacco hx as she quit in 2004.    Past Medical History:  Diagnosis Date  . Chronic kidney disease   . Hypertension   . Peripheral vascular disease (HCC)   . Vitamin D deficiency disease     Past Surgical History:  Procedure Laterality Date  . ABDOMINAL HYSTERECTOMY    . HEMORRHOID SURGERY      Allergies  Allergen Reactions  . Amoxicillin Itching  . Penicillins Hives  . Potassium-Containing Compounds Itching    Current Outpatient Prescriptions  Medication Sig Dispense Refill  . Ascorbic Acid (VITAMIN C PO) Take by mouth.    . calcium carbonate (TUMS - DOSED IN MG ELEMENTAL CALCIUM) 500 MG chewable tablet Chew 1 tablet by mouth daily.    . Cholecalciferol (VITAMIN D PO) Take by mouth.    . fish oil-omega-3 fatty acids 1000 MG capsule Take 2 g by mouth daily.    . Garlic 1000 MG CAPS Take by mouth.    Marland Kitchen glucosamine-chondroitin 500-400 MG tablet Take 1 tablet by mouth 3 (three) times daily.    Marland Kitchen lisinopril (PRINIVIL,ZESTRIL) 5 MG tablet Take 5 mg by mouth daily.    Marland Kitchen loratadine (CLARITIN)  10 MG tablet Take 10 mg by mouth daily.    . Multiple Vitamin (MULTIVITAMIN) tablet Take 1 tablet by mouth daily.    . Multiple Vitamins-Minerals (CENTRUM SILVER 50+WOMEN PO) Take by mouth.    . ranitidine (ZANTAC) 150 MG tablet Take 150 mg by mouth 2 (two) times daily.     No current facility-administered medications for this visit.     Family History: There is no family hx of heart disease or AAA   Social History   Social History  . Marital status: Widowed    Spouse name: N/A  . Number of children: N/A  . Years of education: N/A   Occupational History  . Not on file.   Social History Main Topics  . Smoking status: Former Smoker    Types: Cigarettes    Quit date: 2004  . Smokeless tobacco: Never Used  . Alcohol use No  . Drug use: No  . Sexual activity: Not on file   Other Topics Concern  . Not on file   Social History Narrative  . No narrative on file     REVIEW OF SYSTEMS:   [X]  denotes positive finding, [ ]  denotes negative finding Cardiac  Comments:  Chest pain or chest pressure:    Shortness of breath upon exertion:    Short of breath when lying flat:    Irregular heart rhythm:        Vascular    Pain in calf, thigh, or hip brought on by ambulation:    Pain in feet at night that wakes you up from your sleep:     Blood clot in your veins:    Leg swelling:         Pulmonary    Oxygen at home:    Productive cough:     Wheezing:         Neurologic    Sudden weakness in arms or legs:     Sudden numbness in arms or legs:     Sudden onset of difficulty speaking or slurred speech:    Temporary loss of vision in one eye:     Problems with dizziness:         Gastrointestinal    Blood in stool:     Vomited blood:         Genitourinary    Burning when urinating:     Blood in urine:        Psychiatric    Major depression:         Hematologic    Bleeding problems:    Problems with blood clotting too easily:        Skin    Rashes or ulcers:          Constitutional    Fever or chills:      PHYSICAL EXAMINATION:  Vitals:   09/29/17 1459  BP: (!) 144/75  Pulse: 75  Resp: 16  Temp: 97.8 F (36.6 C)  SpO2: 97%   Vitals:   09/29/17 1459  Weight: 207 lb (93.9 kg)  Height: 5\' 1"  (1.549 m)   Body mass index is 39.11 kg/m.  General:  WDWN in NAD; vital signs documented above Gait: Not observed HENT: WNL, normocephalic Pulmonary: normal non-labored breathing , without Rales, rhonchi,  wheezing Cardiac: regular HR, without  Murmurs without carotid bruits Abdomen: obese Skin: without rashes Vascular Exam/Pulses:  Right Left  Popliteal Unable to palpate  Unable to palpate   DP Unable to palpate  Unable to palpate   PT Unable to palpate  Unable to palpate    Extremities: hemosiderin changes bilateral ankles with the left>right; mild ankle edema left>right Musculoskeletal: no muscle wasting or atrophy  Neurologic: A&O X 3;  No focal weakness or paresthesias are detected Psychiatric:  The pt has Normal affect.   Non-Invasive Vascular Imaging:   Lower Extremity Venous Duplex 09/29/17: -there is no evidence of DVT or superficial vein thrombophlebitis   -there is evidence of deep vein reflux >541ms in the LLE -there is no evidence of GSV reflux -the SSV is competent  Pt meds includes: Statin:  No. Beta Blocker:  No. Aspirin:  No. ACEI:  Yes.   ARB:  No. CCB use:  No Other Antiplatelet/Anticoagulant:  No   ASSESSMENT/PLAN:: 83 y.o. female with leg pain bilaterally    -pt's pain in her lower legs may be multifactorial aspt's left lower extremity venous reflux study reveals venous reflux in the deep system and no reflux in the superficial system.  Pt may benefit from some compression.    -Her pedal pulses are not palpable and she may have a component of PAD-we will have her f/u in 6 months with ABI's and see NP.  She does not have any non healing wounds.  -leg pain also from back and may need injection per  pt according to Dr. Netta CorriganGioffree. -she is encouraged to walk 30 minutes daily even if she needs to sit down and rest between walks.  -she may benefit from some physical therapy as well.     Doreatha MassedSamantha Rhyne, PA-C [facility] [phone]  Clinic MD:  Pt seen and examined with Dr. Darrick PennaFields  History and exam findings as above.patient has bilateral leg weakness. This probably multifactorial related to her back some element of chronic venous insufficiency and an element of peripheral arterial disease as well. She does have mild venous reflux but not significant enough to consider laser ablation. She probably has an element of peripheral arterial disease but again in light of her age and the fact that she is not at risk of limb loss currently I would not consider any intervention for this. She also has probably some component of arthritis and chronic back issues that are contributing to her leg issues.  Plan: As above. The patient was offered a referral for physical therapy and she did not want to proceed with this.  She will have follow-up ABIs in a visit with our nurse practitioner in 6 months.  Fabienne Brunsharles Elisheva Fallas, MD [facility] [place] Office: [phone] Pager: [phone]

## 2017-10-17 NOTE — Addendum Note (Signed)
Addended by: Burton ApleyPETTY, Anetria Harwick A on: 10/17/2017 04:49 PM   Modules accepted: Orders

## 2017-12-01 ENCOUNTER — Ambulatory Visit: Payer: Medicare Other | Admitting: Podiatry

## 2017-12-01 DIAGNOSIS — M79609 Pain in unspecified limb: Secondary | ICD-10-CM

## 2017-12-01 DIAGNOSIS — B353 Tinea pedis: Secondary | ICD-10-CM | POA: Diagnosis not present

## 2017-12-01 DIAGNOSIS — B351 Tinea unguium: Secondary | ICD-10-CM

## 2017-12-01 MED ORDER — ERYTHROMYCIN 2 % EX GEL: Freq: Every day | CUTANEOUS | 0 refills | Status: DC

## 2017-12-01 NOTE — Progress Notes (Signed)
   Subjective:    Patient ID: Tricia Ferguson, female    DOB: 06/05/1934, 83 y.o.   MRN: [id number]  HPI  Chief Complaint  Patient presents with  . Tinea Pedis    bilateral  . Callouses    non diabetic/ several corns bilaterally  . Nail Problem    bilateral elongated thickened toenails   83 y.o. female presents with the above complaint.  Reports bilateral thickened toenails that she cannot care for herself.  Reports that they hurt her when she wears shoes.  States that last time she had a pedicure they cut her.  Complains of skin being raw and peeling in between her toes. Past Medical History:  Diagnosis Date  . Chronic kidney disease   . Hypertension   . Peripheral vascular disease (HCC)   . Vitamin D deficiency disease    Past Surgical History:  Procedure Laterality Date  . ABDOMINAL HYSTERECTOMY    . HEMORRHOID SURGERY      Current Outpatient Medications:  .  Ascorbic Acid (VITAMIN C PO), Take by mouth., Disp: , Rfl:  .  calcium carbonate (TUMS - DOSED IN MG ELEMENTAL CALCIUM) 500 MG chewable tablet, Chew 1 tablet by mouth daily., Disp: , Rfl:  .  Cholecalciferol (VITAMIN D PO), Take by mouth., Disp: , Rfl:  .  erythromycin with ethanol (ERYGEL) 2 % gel, Apply topically daily. In between affected toes., Disp: 30 g, Rfl: 0 .  fish oil-omega-3 fatty acids 1000 MG capsule, Take 2 g by mouth daily., Disp: , Rfl:  .  Garlic 1000 MG CAPS, Take by mouth., Disp: , Rfl:  .  glucosamine-chondroitin 500-400 MG tablet, Take 1 tablet by mouth 3 (three) times daily., Disp: , Rfl:  .  lisinopril (PRINIVIL,ZESTRIL) 5 MG tablet, Take 5 mg by mouth daily., Disp: , Rfl:  .  loratadine (CLARITIN) 10 MG tablet, Take 10 mg by mouth daily., Disp: , Rfl:  .  Multiple Vitamin (MULTIVITAMIN) tablet, Take 1 tablet by mouth daily., Disp: , Rfl:  .  Multiple Vitamins-Minerals (CENTRUM SILVER 50+WOMEN PO), Take by mouth., Disp: , Rfl:  .  ranitidine (ZANTAC) 150 MG tablet, Take 150 mg by mouth 2  (two) times daily., Disp: , Rfl:   Allergies  Allergen Reactions  . Amoxicillin Itching  . Penicillins Hives  . Potassium-Containing Compounds Itching    Review of Systems all systems reviewed and negative except as noted in HPI     Objective:   Physical Exam There were no vitals filed for this visit. General AA&O x3. Normal mood and affect.  Vascular Dorsalis pedis and posterior tibial pulses  present 2+ bilaterally  Capillary refill normal to all digits. Pedal hair growth normal.  Neurologic Epicritic sensation grossly present.  Dermatologic No open lesions. Interspaces macerated fourth fifth toes bilateral Nails x10 elongated thickened with yellow discoloration crumbly texture brittle texture.  Orthopedic: MMT 5/5 in dorsiflexion, plantarflexion, inversion, and eversion. Normal joint ROM without pain or crepitus.      Assessment & Plan:  Patient was evaluated and treated all questions answered  Onychomycosis with pain -Nails palliatively debrided  Procedure: Nail Debridement Rationale: Pain Type of Debridement: manual, sharp debridement. Instrumentation: Nail nipper, rotary burr. Number of Nails: 10  Interdigital maceration -Rx Erygel Return if symptoms worsen or fail to improve.

## 2017-12-12 ENCOUNTER — Other Ambulatory Visit: Payer: Self-pay | Admitting: Internal Medicine

## 2017-12-12 DIAGNOSIS — Z1231 Encounter for screening mammogram for malignant neoplasm of breast: Secondary | ICD-10-CM

## 2017-12-14 DIAGNOSIS — I129 Hypertensive chronic kidney disease with stage 1 through stage 4 chronic kidney disease, or unspecified chronic kidney disease: Secondary | ICD-10-CM | POA: Diagnosis not present

## 2017-12-14 DIAGNOSIS — N182 Chronic kidney disease, stage 2 (mild): Secondary | ICD-10-CM | POA: Diagnosis not present

## 2017-12-14 DIAGNOSIS — M5431 Sciatica, right side: Secondary | ICD-10-CM | POA: Diagnosis not present

## 2017-12-14 DIAGNOSIS — M1711 Unilateral primary osteoarthritis, right knee: Secondary | ICD-10-CM | POA: Diagnosis not present

## 2018-01-04 ENCOUNTER — Ambulatory Visit
Admission: RE | Admit: 2018-01-04 | Discharge: 2018-01-04 | Disposition: A | Discharge status: Home / Self Care | Payer: Medicare Other | Source: Ambulatory Visit | Attending: Internal Medicine | Admitting: Internal Medicine

## 2018-01-04 DIAGNOSIS — Z1231 Encounter for screening mammogram for malignant neoplasm of breast: Secondary | ICD-10-CM | POA: Diagnosis not present

## 2018-01-13 DIAGNOSIS — R42 Dizziness and giddiness: Secondary | ICD-10-CM | POA: Diagnosis not present

## 2018-01-13 DIAGNOSIS — M545 Low back pain: Secondary | ICD-10-CM | POA: Diagnosis not present

## 2018-01-13 DIAGNOSIS — M542 Cervicalgia: Secondary | ICD-10-CM | POA: Diagnosis not present

## 2018-01-24 DIAGNOSIS — H353222 Exudative age-related macular degeneration, left eye, with inactive choroidal neovascularization: Secondary | ICD-10-CM | POA: Diagnosis not present

## 2018-01-24 DIAGNOSIS — H353111 Nonexudative age-related macular degeneration, right eye, early dry stage: Secondary | ICD-10-CM | POA: Diagnosis not present

## 2018-01-24 DIAGNOSIS — H43813 Vitreous degeneration, bilateral: Secondary | ICD-10-CM | POA: Diagnosis not present

## 2018-04-04 ENCOUNTER — Encounter (HOSPITAL_COMMUNITY): Payer: Medicare Other

## 2018-04-04 ENCOUNTER — Ambulatory Visit: Payer: Medicare Other | Admitting: Family

## 2018-05-22 ENCOUNTER — Ambulatory Visit (INDEPENDENT_AMBULATORY_CARE_PROVIDER_SITE_OTHER)
Admission: RE | Admit: 2018-05-22 | Discharge: 2018-05-22 | Disposition: A | Discharge status: Home / Self Care | Payer: Medicare Other | Source: Ambulatory Visit | Attending: Family | Admitting: Family

## 2018-05-22 ENCOUNTER — Ambulatory Visit (INDEPENDENT_AMBULATORY_CARE_PROVIDER_SITE_OTHER): Payer: Medicare Other | Admitting: Family

## 2018-05-22 ENCOUNTER — Ambulatory Visit (HOSPITAL_COMMUNITY)
Admission: RE | Admit: 2018-05-22 | Discharge: 2018-05-22 | Disposition: A | Discharge status: Home / Self Care | Payer: Medicare Other | Source: Ambulatory Visit | Attending: Vascular Surgery | Admitting: Vascular Surgery

## 2018-05-22 ENCOUNTER — Encounter: Payer: Self-pay | Admitting: Family

## 2018-05-22 VITALS — BP 134/74 | HR 71 | Temp 96.8°F | Resp 16 | Ht 61.0 in | Wt 202.6 lb

## 2018-05-22 DIAGNOSIS — I872 Venous insufficiency (chronic) (peripheral): Secondary | ICD-10-CM

## 2018-05-22 DIAGNOSIS — M5431 Sciatica, right side: Secondary | ICD-10-CM | POA: Diagnosis not present

## 2018-05-22 DIAGNOSIS — M79661 Pain in right lower leg: Secondary | ICD-10-CM

## 2018-05-22 DIAGNOSIS — M79605 Pain in left leg: Secondary | ICD-10-CM | POA: Insufficient documentation

## 2018-05-22 DIAGNOSIS — M79604 Pain in right leg: Secondary | ICD-10-CM | POA: Diagnosis not present

## 2018-05-22 DIAGNOSIS — I739 Peripheral vascular disease, unspecified: Secondary | ICD-10-CM | POA: Diagnosis not present

## 2018-05-22 DIAGNOSIS — M7989 Other specified soft tissue disorders: Secondary | ICD-10-CM

## 2018-05-22 NOTE — Patient Instructions (Addendum)
To measure for knee high compression hose: Measure the length of calf (from the crease of the knee to the bottom of the heel), largest circumference of calf, and ankle circumference first thing in the morning before your legs have a chance to swell.  Take these 3 measurements with you to obtain 20-30 mm mercury graduated knee high compression hose.  Put the stockings on in the morning, remove at bedtime.    To decrease swelling in your feet and legs: Elevate feet above slightly bent knees, feet above heart, overnight and 3-4 times per day for 20 minutes.      Chronic Venous Insufficiency Chronic venous insufficiency, also called venous stasis, is a condition that prevents blood from being pumped effectively through the veins in your legs. Blood may no longer be pumped effectively from the legs back to the heart. This condition can range from mild to severe. With proper treatment, you should be able to continue with an active life. What are the causes? Chronic venous insufficiency occurs when the vein walls become stretched, weakened, or damaged, or when valves within the vein are damaged. Some common causes of this include:  High blood pressure inside the veins (venous hypertension).  Increased blood pressure in the leg veins from long periods of sitting or standing.  A blood clot that blocks blood flow in a vein (deep vein thrombosis, DVT).  Inflammation of a vein (phlebitis) that causes a blood clot to form.  Tumors in the pelvis that cause blood to back up.  What increases the risk? The following factors may make you more likely to develop this condition:  Having a family history of this condition.  Obesity.  Pregnancy.  Living without enough physical activity or exercise (sedentary lifestyle).  Smoking.  Having a job that requires long periods of standing or sitting in one place.  Being a certain age. Women in their 61s and 37s and men in their 1s are more likely to  develop this condition.  What are the signs or symptoms? Symptoms of this condition include:  Veins that are enlarged, bulging, or twisted (varicose veins).  Skin breakdown or ulcers.  Reddened or discolored skin on the front of the leg.  Brown, smooth, tight, and painful skin just above the ankle, usually on the inside of the leg (lipodermatosclerosis).  Swelling.  How is this diagnosed? This condition may be diagnosed based on:  Your medical history.  A physical exam.  Tests, such as: ? A procedure that creates an image of a blood vessel and nearby organs and provides information about blood flow through the blood vessel (duplex ultrasound). ? A procedure that tests blood flow (plethysmography). ? A procedure to look at the veins using X-ray and dye (venogram).  How is this treated? The goals of treatment are to help you return to an active life and to minimize pain or disability. Treatment depends on the severity of your condition, and it may include:  Wearing compression stockings. These can help relieve symptoms and help prevent your condition from getting worse. However, they do not cure the condition.  Sclerotherapy. This is a procedure involving an injection of a material that "dissolves" damaged veins.  Surgery. This may involve: ? Removing a diseased vein (vein stripping). ? Cutting off blood flow through the vein (laser ablation surgery). ? Repairing a valve.  Follow these instructions at home:  Wear compression stockings as told by your health care provider. These stockings help to prevent blood clots and reduce  swelling in your legs.  Take over-the-counter and prescription medicines only as told by your health care provider.  Stay active by exercising, walking, or doing different activities. Ask your health care provider what activities are safe for you and how much exercise you need.  Drink enough fluid to keep your urine clear or pale yellow.  Do not  use any products that contain nicotine or tobacco, such as cigarettes and e-cigarettes. If you need help quitting, ask your health care provider.  Keep all follow-up visits as told by your health care provider. This is important. Contact a health care provider if:  You have redness, swelling, or more pain in the affected area.  You see a red streak or line that extends up or down from the affected area.  You have skin breakdown or a loss of skin in the affected area, even if the breakdown is small.  You get an injury in the affected area. Get help right away if:  You get an injury and an open wound in the affected area.  You have severe pain that does not get better with medicine.  You have sudden numbness or weakness in the foot or ankle below the affected area, or you have trouble moving your foot or ankle.  You have a fever and you have worse or persistent symptoms.  You have chest pain.  You have shortness of breath. Summary  Chronic venous insufficiency, also called venous stasis, is a condition that prevents blood from being pumped effectively through the veins in your legs.  Chronic venous insufficiency occurs when the vein walls become stretched, weakened, or damaged, or when valves within the vein are damaged.  Treatment for this condition depends on how severe your condition is, and it may involve wearing compression stockings or having a procedure.  Make sure you stay active by exercising, walking, or doing different activities. Ask your health care provider what activities are safe for you and how much exercise you need. This information is not intended to replace advice given to you by your health care provider. Make sure you discuss any questions you have with your health care provider. Document Released: 04/04/2007 Document Revised: 10/18/2016 Document Reviewed: 10/18/2016 [employer]vier Interactive Patient Education  2017 [employer]

## 2018-05-22 NOTE — Progress Notes (Signed)
[facility] [place]   CC: Follow up right leg pain, known venous reflux   History of Present Illness Tricia Ferguson is a 84 y.o. female who was referred to Dr. Darrick Penna for leg pain. He saw pt at initial evaluation on 09-29-17.   She states that she has had pain in both hips-first the left hip and that improved and then the right hip.  She says she has pain down her right leg.  She states that she just doesn't get around like she feels she should.  She states that she has some pain and discoloration in her left ankle that she feels was from smoking in the past.  She states she does have some ankle swelling bilaterally.  She states that she can walk, but can walk about 25 ft before she has to sit down.  At her 09-29-17 visit with Dr. Darrick Penna patient had bilateral leg weakness. This is probably multifactorial related to her back, some element of chronic venous insufficiency, and an element of peripheral arterial disease as well. She does have mild venous reflux but not significant enough to consider laser ablation. She probably has an element of peripheral arterial disease but again in light of her age and the fact that she is not at risk of limb loss at that time, Dr. Darrick Penna would not consider any intervention for this. She also has probably some component of arthritis and chronic back issues that are contributing to her leg issues. The patient was offered a referral for physical therapy and she did not want to proceed with this. She was to have follow-up ABIs in a visit with our nurse practitioner in 6 months.  Pt states she was offered injections in her back to help alleviate her right leg sciatic type pain, but states she is hesitant to have this done since she read an article in which a man became paralyzed after this was done.  I advised pt to speak with the medical provider who offered this, and ask the percentage of people that become paralyzed after this, the percentage  that gain some pain relief, and the percentage that gain no relief.    She states she has seen Dr. Netta Corrigan in the past and felt that her hip and leg problems were related to her back and felt that she would benefit from an injection in her back.  She was not willing to proceed.      Diabetic: No Tobacco use: former smoker, quit about 2008  Pt meds include: Statin :No Betablocker: No ASA: No Other anticoagulants/antiplatelets: no  Past Medical History:  Diagnosis Date  . Chronic kidney disease   . Hypertension   . Peripheral vascular disease (HCC)   . Vitamin D deficiency disease     Social History Social History   Tobacco Use  . Smoking status: Former Smoker    Types: Cigarettes    Last attempt to quit: 2004    Years since quitting: 15.4  . Smokeless tobacco: Never Used  Substance Use Topics  . Alcohol use: No  . Drug use: No    Family History History reviewed. No pertinent family history.  Past Surgical History:  Procedure Laterality Date  . ABDOMINAL HYSTERECTOMY    . HEMORRHOID SURGERY      Allergies  Allergen Reactions  . Amoxicillin Itching  . Penicillins Hives  . Potassium-Containing Compounds Itching    Current Outpatient Medications  Medication Sig Dispense Refill  . Ascorbic Acid (VITAMIN C  PO) Take by mouth.    . calcium carbonate (TUMS - DOSED IN MG ELEMENTAL CALCIUM) 500 MG chewable tablet Chew 2 tablets by mouth daily.     . Cholecalciferol (VITAMIN D PO) Take by mouth.    . erythromycin with ethanol (ERYGEL) 2 % gel Apply topically daily. In between affected toes. 30 g 0  . fish oil-omega-3 fatty acids 1000 MG capsule Take 2 g by mouth daily.    . Garlic 1000 MG CAPS Take by mouth.    Marland Kitchen glucosamine-chondroitin 500-400 MG tablet Take 1 tablet by mouth 3 (three) times daily.    Marland Kitchen lisinopril (PRINIVIL,ZESTRIL) 5 MG tablet Take 5 mg by mouth daily.    Marland Kitchen loratadine (CLARITIN) 10 MG tablet Take 10 mg by mouth daily.    . Multiple Vitamin  (MULTIVITAMIN) tablet Take 1 tablet by mouth daily.    . Multiple Vitamins-Minerals (CENTRUM SILVER 50+WOMEN PO) Take by mouth.    . ranitidine (ZANTAC) 150 MG tablet Take 150 mg by mouth daily.      No current facility-administered medications for this visit.     ROS: See HPI for pertinent positives and negatives.   Physical Examination  Vitals:   05/22/18 1502  BP: 134/74  Pulse: 71  Resp: 16  Temp: (!) 96.8 F (36 C)  TempSrc: Oral  SpO2: 97%  Weight: 202 lb 9.6 oz (91.9 kg)  Height: 5\' 1"  (1.549 m)   Body mass index is 38.28 kg/m.  General: A&O x 3, WDWN, obese female. Gait: slow, slight limp HENT: No gross abnormalities.  Eyes: PERRLA. Pulmonary: Respirations are non labored, CTAB, good air movement Cardiac: regular rhythm, no detected murmur.         Carotid Bruits Right Left   Negative Negative   Radial pulses are 2+ palpable bilaterally   Adominal aortic pulse is not palpable                         VASCULAR EXAM: Extremities without ischemic changes, without Gangrene; without open wounds. Thickened and dark skin at left medial malleolus.  Trace bilateral ankle edema.                                                                                                           LE Pulses Right Left       POPLITEAL  not palpable   not palpable       POSTERIOR TIBIAL  not palpable   not palpable        DORSALIS PEDIS      ANTERIOR TIBIAL 2+ palpable  1+ palpable    Abdomen: soft, NT, no palpable masses. Skin: no rashes, no cellulitis, no ulcers noted.  Musculoskeletal: no muscle wasting or atrophy.  Neurologic: A&O X 3; appropriate affect, Sensation is normal; MOTOR FUNCTION:  moving all extremities equally, motor strength 5/5 throughout. Speech is fluent/normal. CN 2-12 intact. Psychiatric: Thought content is normal, mood appropriate for clinical situation.     ASSESSMENT: Tricia Ferguson is a 84  y.o. female who presents with pain and swelling in  right lower leg; swelling of both lower legs that mostly resolves by morning with elevation of her feet.   Bilateral ABI's today are normal with all triphasic waveforms.  Right leg venous duplex today demonstrates no DVT and no SVT. Left leg venous duplex on 09-29-17 demonstrated no DVT and no SVT.    Her right leg pain is likely due to sciatica, known arthritis, exacerbated by obesity; she was offered what sounds like ESI's, but has declined so far.    Bilateral chronic venous insufficiency: 1) elevation of feet above heart overnight and for 20 minutes, 3-4x/day. 2) Knee high compression hose, see Patient Instructions.   DATA  Venous Duplex of Right Leg (05-22-18): No DVT, no SVT of the right lower extremity.   Venous Duplex of the Left LE (09-29-17): No DVT, no SVT of the left lower extremity.   ABI (Date: 05/22/2018):  R:   ABI: 1.07 (no previous),   PT: tri  DP: tri  TBI:  0.67  L:   ABI: 1.17,   PT: tri  DP: tri  TBI: 0.66  Normal bilateral ABI with all triphasic waveforms.     PLAN:  Based on the patient's vascular studies and examination, pt will return to clinic in as needed.   I discussed in depth with the patient the nature of atherosclerosis, and emphasized the importance of maximal medical management including strict control of blood pressure, blood glucose, and lipid levels, obtaining regular exercise, and continued cessation of smoking.  The patient is aware that without maximal medical management the underlying atherosclerotic disease process will progress, limiting the benefit of any interventions.  The patient was given information about PAD including signs, symptoms, treatment, what symptoms should prompt the patient to seek immediate medical care, and risk reduction measures to take.  Charisse MarchSuzanne Jaiden Wahab, RN, MSN, FNP-C [facility] [place] Office Phone: [phone]  Clinic MD: Imogene BurnChen  05/22/18 3:22 PM

## 2018-06-20 DIAGNOSIS — H2513 Age-related nuclear cataract, bilateral: Secondary | ICD-10-CM | POA: Diagnosis not present

## 2018-06-20 DIAGNOSIS — H25013 Cortical age-related cataract, bilateral: Secondary | ICD-10-CM | POA: Diagnosis not present

## 2018-06-21 DIAGNOSIS — I10 Essential (primary) hypertension: Secondary | ICD-10-CM | POA: Diagnosis not present

## 2018-06-21 DIAGNOSIS — Z79899 Other long term (current) drug therapy: Secondary | ICD-10-CM | POA: Diagnosis not present

## 2018-06-21 DIAGNOSIS — E668 Other obesity: Secondary | ICD-10-CM | POA: Diagnosis not present

## 2018-07-20 DIAGNOSIS — H2511 Age-related nuclear cataract, right eye: Secondary | ICD-10-CM | POA: Diagnosis not present

## 2018-07-20 DIAGNOSIS — H25811 Combined forms of age-related cataract, right eye: Secondary | ICD-10-CM | POA: Diagnosis not present

## 2018-07-20 DIAGNOSIS — H25011 Cortical age-related cataract, right eye: Secondary | ICD-10-CM | POA: Diagnosis not present

## 2018-07-31 DIAGNOSIS — M51379 Other intervertebral disc degeneration, lumbosacral region without mention of lumbar back pain or lower extremity pain: Secondary | ICD-10-CM | POA: Insufficient documentation

## 2018-07-31 DIAGNOSIS — M5137 Other intervertebral disc degeneration, lumbosacral region: Secondary | ICD-10-CM | POA: Diagnosis not present

## 2018-08-10 DIAGNOSIS — H43813 Vitreous degeneration, bilateral: Secondary | ICD-10-CM | POA: Diagnosis not present

## 2018-08-10 DIAGNOSIS — H353221 Exudative age-related macular degeneration, left eye, with active choroidal neovascularization: Secondary | ICD-10-CM | POA: Diagnosis not present

## 2018-08-10 DIAGNOSIS — H353111 Nonexudative age-related macular degeneration, right eye, early dry stage: Secondary | ICD-10-CM | POA: Diagnosis not present

## 2018-08-24 DIAGNOSIS — H25012 Cortical age-related cataract, left eye: Secondary | ICD-10-CM | POA: Diagnosis not present

## 2018-08-24 DIAGNOSIS — H25812 Combined forms of age-related cataract, left eye: Secondary | ICD-10-CM | POA: Diagnosis not present

## 2018-08-24 DIAGNOSIS — H2512 Age-related nuclear cataract, left eye: Secondary | ICD-10-CM | POA: Diagnosis not present

## 2018-09-01 DIAGNOSIS — Z23 Encounter for immunization: Secondary | ICD-10-CM | POA: Diagnosis not present

## 2018-09-20 DIAGNOSIS — H353221 Exudative age-related macular degeneration, left eye, with active choroidal neovascularization: Secondary | ICD-10-CM | POA: Diagnosis not present

## 2018-10-06 ENCOUNTER — Encounter: Payer: Self-pay | Admitting: Nurse Practitioner

## 2018-10-06 ENCOUNTER — Ambulatory Visit: Payer: Medicare Other | Admitting: Nurse Practitioner

## 2018-10-06 VITALS — BP 120/70 | HR 75 | Temp 98.2°F | Ht 59.0 in | Wt 207.0 lb

## 2018-10-06 DIAGNOSIS — J0100 Acute maxillary sinusitis, unspecified: Secondary | ICD-10-CM | POA: Diagnosis not present

## 2018-10-06 MED ORDER — DOXYCYCLINE HYCLATE 100 MG PO TABS: 100.0000 mg | ORAL_TABLET | Freq: Two times a day (BID) | ORAL | 0 refills | Status: DC

## 2018-10-06 NOTE — Progress Notes (Signed)
Subjective:     Patient ID: Tricia Ferguson , female    DOB: [birth year] , 84 y.o.   MRN: [id number]   Chief Complaint  Patient presents with  . Eye Pain    right side of her face is bothering her.     HPI  Eye Pain   The right eye is affected. This is a new (Began 2 weeks ago) problem. The current episode started 1 to 4 weeks ago. The problem has been gradually worsening. There was no injury mechanism. There is no known exposure to pink eye. She does not wear contacts. Pertinent negatives include no eye redness or photophobia. Associated symptoms comments: Rhinorhea, painful to look out of right eye.  . Treatments tried: She was taking Tylenol and had improved.   The treatment provided mild relief.     Past Medical History:  Diagnosis Date  . Chronic kidney disease   . Hypertension   . Peripheral vascular disease (HCC)   . Vitamin D deficiency disease       Current Outpatient Medications:  .  Ascorbic Acid (VITAMIN C PO), Take by mouth., Disp: , Rfl:  .  calcium carbonate (TUMS - DOSED IN MG ELEMENTAL CALCIUM) 500 MG chewable tablet, Chew 2 tablets by mouth daily. , Disp: , Rfl:  .  Cholecalciferol (VITAMIN D PO), Take by mouth., Disp: , Rfl:  .  fish oil-omega-3 fatty acids 1000 MG capsule, Take 2 g by mouth daily., Disp: , Rfl:  .  Garlic 1000 MG CAPS, Take by mouth., Disp: , Rfl:  .  glucosamine-chondroitin 500-400 MG tablet, Take 1 tablet by mouth 3 (three) times daily., Disp: , Rfl:  .  lisinopril (PRINIVIL,ZESTRIL) 5 MG tablet, Take 5 mg by mouth daily., Disp: , Rfl:  .  loratadine (CLARITIN) 10 MG tablet, Take 10 mg by mouth daily., Disp: , Rfl:  .  Multiple Vitamin (MULTIVITAMIN) tablet, Take 1 tablet by mouth daily., Disp: , Rfl:  .  ranitidine (ZANTAC) 150 MG tablet, Take 150 mg by mouth daily. , Disp: , Rfl:  .  erythromycin with ethanol (ERYGEL) 2 % gel, Apply topically daily. In between affected toes. (Patient not taking: Reported on 10/06/2018), Disp: 30 g, Rfl: 0    Allergies  Allergen Reactions  . Amoxicillin Itching  . Penicillins Hives  . Potassium-Containing Compounds Itching     Review of Systems  HENT: Positive for rhinorrhea, sinus pressure and sinus pain. Negative for congestion and sore throat.   Eyes: Positive for pain. Negative for photophobia, redness and visual disturbance.  Respiratory: Negative.   Cardiovascular: Negative.   Neurological: Negative.      Today's Vitals   10/06/18 1416  BP: 120/70  Pulse: 75  Temp: 98.2 F (36.8 C)  TempSrc: Oral  SpO2: 95%  Weight: 207 lb (93.9 kg)  Height: 4\' 11"  (1.499 m)  PainSc: 10-Worst pain ever  PainLoc: Eye   Body mass index is 41.81 kg/m.   Objective:  Physical Exam  Constitutional: She appears well-developed and well-nourished.  Eyes: Pupils are equal, round, and reactive to light. EOM and lids are normal.    Cardiovascular: Normal rate, regular rhythm and normal heart sounds.  Pulmonary/Chest: Effort normal and breath sounds normal.        Assessment And Plan:     1. Acute non-recurrent maxillary sinusitis  Eye pain related to increased fluid/pressure behind frontal sinuses - doxycycline (VIBRA-TABS) 100 MG tablet; Take 1 tablet (100 mg total) by mouth 2 (two)  times daily.  Dispense: 20 tablet; Refill: 0    Arnette Felts, FNP

## 2018-10-26 DIAGNOSIS — H353221 Exudative age-related macular degeneration, left eye, with active choroidal neovascularization: Secondary | ICD-10-CM | POA: Diagnosis not present

## 2018-11-04 ENCOUNTER — Other Ambulatory Visit: Payer: Self-pay | Admitting: Internal Medicine

## 2018-11-23 DIAGNOSIS — H353221 Exudative age-related macular degeneration, left eye, with active choroidal neovascularization: Secondary | ICD-10-CM | POA: Diagnosis not present

## 2018-12-12 ENCOUNTER — Other Ambulatory Visit: Payer: Self-pay | Admitting: Internal Medicine

## 2018-12-12 DIAGNOSIS — Z1231 Encounter for screening mammogram for malignant neoplasm of breast: Secondary | ICD-10-CM

## 2019-01-04 ENCOUNTER — Ambulatory Visit: Payer: Medicare Other | Admitting: Internal Medicine

## 2019-01-04 ENCOUNTER — Encounter: Payer: Self-pay | Admitting: Internal Medicine

## 2019-01-04 ENCOUNTER — Encounter: Payer: Medicare Other | Admitting: Internal Medicine

## 2019-01-04 VITALS — BP 118/66 | HR 67 | Temp 97.2°F | Ht 63.5 in | Wt 202.4 lb

## 2019-01-04 DIAGNOSIS — M25561 Pain in right knee: Secondary | ICD-10-CM

## 2019-01-04 DIAGNOSIS — E66812 Obesity, class 2: Secondary | ICD-10-CM | POA: Insufficient documentation

## 2019-01-04 DIAGNOSIS — R2689 Other abnormalities of gait and mobility: Secondary | ICD-10-CM | POA: Diagnosis not present

## 2019-01-04 DIAGNOSIS — Z6835 Body mass index (BMI) 35.0-35.9, adult: Secondary | ICD-10-CM

## 2019-01-04 DIAGNOSIS — G250 Essential tremor: Secondary | ICD-10-CM

## 2019-01-04 DIAGNOSIS — E2839 Other primary ovarian failure: Secondary | ICD-10-CM

## 2019-01-04 DIAGNOSIS — W108XXA Fall (on) (from) other stairs and steps, initial encounter: Secondary | ICD-10-CM

## 2019-01-04 DIAGNOSIS — I10 Essential (primary) hypertension: Secondary | ICD-10-CM

## 2019-01-04 DIAGNOSIS — Z1211 Encounter for screening for malignant neoplasm of colon: Secondary | ICD-10-CM

## 2019-01-04 DIAGNOSIS — Z86010 Personal history of colonic polyps: Secondary | ICD-10-CM

## 2019-01-04 NOTE — Patient Instructions (Signed)
Fall Prevention in the Home, Adult  Falls can cause injuries. They can happen to people of all ages. There are many things you can do to make your home safe and to help prevent falls. Ask for help when making these changes, if needed.  What actions can I take to prevent falls?  General Instructions  · Use good lighting in all rooms. Replace any light bulbs that burn out.  · Turn on the lights when you go into a dark area. Use night-lights.  · Keep items that you use often in easy-to-reach places. Lower the shelves around your home if necessary.  · Set up your furniture so you have a clear path. Avoid moving your furniture around.  · Do not have throw rugs and other things on the floor that can make you trip.  · Avoid walking on wet floors.  · If any of your floors are uneven, fix them.  · Add color or contrast paint or tape to clearly mark and help you see:  ? Any grab bars or handrails.  ? First and last steps of stairways.  ? Where the edge of each step is.  · If you use a stepladder:  ? Make sure that it is fully opened. Do not climb a closed stepladder.  ? Make sure that both sides of the stepladder are locked into place.  ? Ask someone to hold the stepladder for you while you use it.  · If there are any pets around you, be aware of where they are.  What can I do in the bathroom?         · Keep the floor dry. Clean up any water that spills onto the floor as soon as it happens.  · Remove soap buildup in the tub or shower regularly.  · Use non-skid mats or decals on the floor of the tub or shower.  · Attach bath mats securely with double-sided, non-slip rug tape.  · If you need to sit down in the shower, use a plastic, non-slip stool.  · Install grab bars by the toilet and in the tub and shower. Do not use towel bars as grab bars.  What can I do in the bedroom?  · Make sure that you have a light by your bed that is easy to reach.  · Do not use any sheets or blankets that are too big for your bed. They should  not hang down onto the floor.  · Have a firm chair that has side arms. You can use this for support while you get dressed.  What can I do in the kitchen?  · Clean up any spills right away.  · If you need to reach something above you, use a strong step stool that has a grab bar.  · Keep electrical cords out of the way.  · Do not use floor polish or wax that makes floors slippery. If you must use wax, use non-skid floor wax.  What can I do with my stairs?  · Do not leave any items on the stairs.  · Make sure that you have a light switch at the top of the stairs and the bottom of the stairs. If you do not have them, ask someone to add them for you.  · Make sure that there are handrails on both sides of the stairs, and use them. Fix handrails that are broken or loose. Make sure that handrails are as long as the stairways.  ·   Install non-slip stair treads on all stairs in your home.  · Avoid having throw rugs at the top or bottom of the stairs. If you do have throw rugs, attach them to the floor with carpet tape.  · Choose a carpet that does not hide the edge of the steps on the stairway.  · Check any carpeting to make sure that it is firmly attached to the stairs. Fix any carpet that is loose or worn.  What can I do on the outside of my home?  · Use bright outdoor lighting.  · Regularly fix the edges of walkways and driveways and fix any cracks.  · Remove anything that might make you trip as you walk through a door, such as a raised step or threshold.  · Trim any bushes or trees on the path to your home.  · Regularly check to see if handrails are loose or broken. Make sure that both sides of any steps have handrails.  · Install guardrails along the edges of any raised decks and porches.  · Clear walking paths of anything that might make someone trip, such as tools or rocks.  · Have any leaves, snow, or ice cleared regularly.  · Use sand or salt on walking paths during winter.  · Clean up any spills in your garage right  away. This includes grease or oil spills.  What other actions can I take?  · Wear shoes that:  ? Have a low heel. Do not wear high heels.  ? Have rubber bottoms.  ? Are comfortable and fit you well.  ? Are closed at the toe. Do not wear open-toe sandals.  · Use tools that help you move around (mobility aids) if they are needed. These include:  ? Canes.  ? Walkers.  ? Scooters.  ? Crutches.  · Review your medicines with your doctor. Some medicines can make you feel dizzy. This can increase your chance of falling.  Ask your doctor what other things you can do to help prevent falls.  Where to find more information  · Centers for Disease Control and Prevention, STEADI: https://cdc.gov  · National Institute on Aging: https://go4life.nia.nih.gov  Contact a doctor if:  · You are afraid of falling at home.  · You feel weak, drowsy, or dizzy at home.  · You fall at home.  Summary  · There are many simple things that you can do to make your home safe and to help prevent falls.  · Ways to make your home safe include removing tripping hazards and installing grab bars in the bathroom.  · Ask for help when making these changes in your home.  This information is not intended to replace advice given to you by your health care provider. Make sure you discuss any questions you have with your health care provider.  Document Released: 09/25/2009 Document Revised: 07/14/2017 Document Reviewed: 07/14/2017  Elsevier Interactive Patient Education © 2019 Elsevier Inc.

## 2019-01-05 LAB — CMP14+EGFR
ALT: 19 IU/L (ref 0–32)
AST: 24 IU/L (ref 0–40)
Albumin/Globulin Ratio: 1.3 (ref 1.2–2.2)
Albumin: 3.8 g/dL (ref 3.6–4.6)
Alkaline Phosphatase: 78 IU/L (ref 39–117)
BUN/Creatinine Ratio: 29 — ABNORMAL HIGH (ref 12–28)
BUN: 24 mg/dL (ref 8–27)
Bilirubin Total: 0.4 mg/dL (ref 0.0–1.2)
CO2: 22 mmol/L (ref 20–29)
Calcium: 9.3 mg/dL (ref 8.7–10.3)
Chloride: 104 mmol/L (ref 96–106)
Creatinine, Ser: 0.84 mg/dL (ref 0.57–1.00)
GFR calc Af Amer: 75 mL/min/{1.73_m2} (ref >59)
GFR calc non Af Amer: 65 mL/min/{1.73_m2} (ref >59)
Globulin, Total: 3 g/dL (ref 1.5–4.5)
Glucose: 80 mg/dL (ref 65–99)
Potassium: 4.2 mmol/L (ref 3.5–5.2)
Sodium: 141 mmol/L (ref 134–144)
Total Protein: 6.8 g/dL (ref 6.0–8.5)

## 2019-01-05 LAB — TSH: TSH: 1.43 u[IU]/mL (ref 0.450–4.500)

## 2019-01-06 ENCOUNTER — Encounter: Payer: Self-pay | Admitting: Internal Medicine

## 2019-01-06 NOTE — Progress Notes (Signed)
Subjective:     Patient ID: Tricia Ferguson , female    DOB: 05-08-1934 , 85 y.o.   MRN: [id number]   Chief Complaint  Patient presents with  . Hypertension  . Shaking    HPI  Hypertension  This is a chronic problem. The current episode started more than 1 year ago. The problem has been gradually improving since onset. The problem is controlled. Pertinent negatives include no blurred vision, chest pain, palpitations or shortness of breath. Risk factors for coronary artery disease include obesity, post-menopausal state and sedentary lifestyle.    Shaking  She c/o her head shaking uncontrollably. She first noticed this several months ago. She is not sure what may have triggered her symptoms. She denies having tremors in her hands. She does not have difficulty writing or holding on to objects. She denies visual changes. She has not noticed a change in her gait. However, she does admit to having a fall not too long ago.   Past Medical History:  Diagnosis Date  . Chronic kidney disease   . Hypertension   . Peripheral vascular disease ([place])   . Vitamin D deficiency disease      Family History  Problem Relation Age of Onset  . Hypertension Mother   . Throat cancer Mother   . Prostate cancer Father   . Tremor Father      Current Outpatient Medications:  .  Ascorbic Acid (VITAMIN C PO), Take by mouth., Disp: , Rfl:  .  calcium carbonate (TUMS - DOSED IN MG ELEMENTAL CALCIUM) 500 MG chewable tablet, Chew 2 tablets by mouth daily. , Disp: , Rfl:  .  Cholecalciferol (VITAMIN D3 PO), Take by mouth., Disp: , Rfl:  .  fish oil-omega-3 fatty acids 1000 MG capsule, Take 2 g by mouth daily., Disp: , Rfl:  .  Garlic 7616 MG CAPS, Take by mouth., Disp: , Rfl:  .  glucosamine-chondroitin 500-400 MG tablet, Take 1 tablet by mouth 3 (three) times daily., Disp: , Rfl:  .  lisinopril (PRINIVIL,ZESTRIL) 5 MG tablet, TAKE 1 TABLET BY MOUTH DAILY, Disp: 90 tablet, Rfl: 0 .  loratadine (CLARITIN)  10 MG tablet, Take 10 mg by mouth daily., Disp: , Rfl:  .  Multiple Vitamin (MULTIVITAMIN) tablet, Take 1 tablet by mouth daily., Disp: , Rfl:  .  ranitidine (ZANTAC) 150 MG tablet, Take 150 mg by mouth daily. , Disp: , Rfl:    Allergies  Allergen Reactions  . Amoxicillin Itching  . Penicillins Hives  . Potassium-Containing Compounds Itching     Review of Systems  Constitutional: Negative.   Eyes: Negative for blurred vision.  Respiratory: Negative.  Negative for shortness of breath.   Cardiovascular: Negative.  Negative for chest pain and palpitations.  Gastrointestinal: Negative.   Musculoskeletal: Positive for arthralgias (she c/o r knee pain. there is some pain w/ ambulation. has been seen by Dr. Gladstone Lighter in the past. She would like to see him again. She wants to have injection in her knee. ).  Neurological: Positive for tremors.       She reports having a fall. She states she fell on a Sat 2 months ago, she was coming up the steps and walking into side Ferguson of den while carrying a basket of clothes.  She reports she tripped on the step and fell forward. States she had to call her Granddaughter to help her up. She was happy to have her cell phone on her. She states her knee hurt afterwards.  She did not seek medical attention.   Psychiatric/Behavioral: Negative.      Today's Vitals   01/04/19 1007  BP: 118/66  Pulse: 67  Temp: (!) 97.2 F (36.2 C)  TempSrc: Oral  Weight: 202 lb 6.4 oz (91.8 kg)  Height: 5' 3.5" (1.613 m)  PainSc: 0-No pain   Body mass index is 35.29 kg/m.   Objective:  Physical Exam Vitals signs and nursing note reviewed.  Constitutional:      Appearance: Normal appearance. She is obese.  HENT:     Head: Normocephalic and atraumatic.  Cardiovascular:     Rate and Rhythm: Normal rate and regular rhythm.     Heart sounds: Normal heart sounds.  Pulmonary:     Effort: Pulmonary effort is normal.     Breath sounds: Normal breath sounds.   Musculoskeletal:        General: Swelling present.     Comments: r knee swelling  Neurological:     Mental Status: She is alert.     Comments: Pos head tremor  Psychiatric:        Mood and Affect: Mood normal.         Assessment And Plan:     1. Essential hypertension, benign  Well controlled. She will continue with current meds. She is encouraged to avoid adding salt to her foods. She will RTO in six months for Medicare visit.   - TSH - CMP14+EGFR  2. Benign head tremor  I will refer her to Neuro for further evaluation. Pt advised there are many causes for this like Parkinson's, but it could also be a benign condition. She agrees to Neuro referral.   - Ambulatory referral to Neurology  3. Imbalance  I will refer her to PT for evaluation of gait.   - Ambulatory referral to Physical Therapy  4. Recurrent pain of right knee  Her sx are likely due to osteoarthritis. She does not wish to pursue surgery if needed.  - Ambulatory referral to Orthopedic Surgery - Ambulatory referral to Physical Therapy  5. Estrogen deficiency  I will refer her for bone density. She is encouraged to comply with calcium and vitamin D supplementation.   - DG Bone Density; Future  6. Class 2 severe obesity due to excess calories with serious comorbidity and body mass index (BMI) of 35.0 to 35.9 in adult [facility])  She is encouraged to strive for BMI less than 30 to decrease cardiac risk. She is advised to avoid sugary beverages, procesesd foods and to increase her intake of fresh vegetables.   7. Screen for colon cancer  Her last colonoscopy was in 2014, she has h/o polyps. She was advised to have a repeat study in 2019.   - Ambulatory referral to Gastroenterology  8. Personal history of colonic polyps  - Ambulatory referral to Gastroenterology        [name], MD

## 2019-01-11 ENCOUNTER — Telehealth: Payer: Self-pay

## 2019-01-11 NOTE — Telephone Encounter (Signed)
Left the pt a message to call the office back.  I was calling the pt to see if she was notified that her  Ranitidine was recalled by the manufacture.

## 2019-01-12 ENCOUNTER — Ambulatory Visit: Payer: Medicare Other

## 2019-01-12 ENCOUNTER — Ambulatory Visit
Admission: RE | Admit: 2019-01-12 | Discharge: 2019-01-12 | Disposition: A | Discharge status: Home / Self Care | Payer: Medicare Other | Source: Ambulatory Visit | Attending: Internal Medicine | Admitting: Internal Medicine

## 2019-01-12 ENCOUNTER — Telehealth: Payer: Self-pay

## 2019-01-12 DIAGNOSIS — Z1231 Encounter for screening mammogram for malignant neoplasm of breast: Secondary | ICD-10-CM | POA: Diagnosis not present

## 2019-01-12 NOTE — Telephone Encounter (Signed)
The pt said that she the Ranitidine that she is currently using that was filled at [facility] isn't on the recall list because [facility] changed manufactures.

## 2019-01-15 ENCOUNTER — Other Ambulatory Visit: Payer: Self-pay | Admitting: Internal Medicine

## 2019-01-15 DIAGNOSIS — R928 Other abnormal and inconclusive findings on diagnostic imaging of breast: Secondary | ICD-10-CM

## 2019-01-18 ENCOUNTER — Other Ambulatory Visit: Payer: Medicare Other

## 2019-01-19 ENCOUNTER — Other Ambulatory Visit: Payer: Self-pay | Admitting: Internal Medicine

## 2019-01-19 ENCOUNTER — Ambulatory Visit
Admission: RE | Admit: 2019-01-19 | Discharge: 2019-01-19 | Disposition: A | Discharge status: Home / Self Care | Payer: Medicare Other | Source: Ambulatory Visit | Attending: Internal Medicine | Admitting: Internal Medicine

## 2019-01-19 DIAGNOSIS — N6321 Unspecified lump in the left breast, upper outer quadrant: Secondary | ICD-10-CM | POA: Diagnosis not present

## 2019-01-19 DIAGNOSIS — N6320 Unspecified lump in the left breast, unspecified quadrant: Secondary | ICD-10-CM

## 2019-01-19 DIAGNOSIS — R928 Other abnormal and inconclusive findings on diagnostic imaging of breast: Secondary | ICD-10-CM

## 2019-01-26 ENCOUNTER — Encounter: Payer: Self-pay | Admitting: Internal Medicine

## 2019-01-30 DIAGNOSIS — H35363 Drusen (degenerative) of macula, bilateral: Secondary | ICD-10-CM | POA: Diagnosis not present

## 2019-01-30 DIAGNOSIS — H53132 Sudden visual loss, left eye: Secondary | ICD-10-CM | POA: Diagnosis not present

## 2019-01-30 DIAGNOSIS — H353111 Nonexudative age-related macular degeneration, right eye, early dry stage: Secondary | ICD-10-CM | POA: Diagnosis not present

## 2019-01-30 DIAGNOSIS — H353221 Exudative age-related macular degeneration, left eye, with active choroidal neovascularization: Secondary | ICD-10-CM | POA: Diagnosis not present

## 2019-02-03 ENCOUNTER — Other Ambulatory Visit: Payer: Self-pay | Admitting: Internal Medicine

## 2019-02-22 DIAGNOSIS — M1711 Unilateral primary osteoarthritis, right knee: Secondary | ICD-10-CM | POA: Diagnosis not present

## 2019-02-23 ENCOUNTER — Other Ambulatory Visit: Payer: Self-pay | Admitting: Internal Medicine

## 2019-02-26 ENCOUNTER — Telehealth: Payer: Self-pay

## 2019-02-26 NOTE — Telephone Encounter (Signed)
I called pt. I discussed with pt that Dr. Frances Furbish recommends that pt consider postponing her appt tomorrow to prevent possible exposure to the virus. Pt reports that she does not want to push her appt out and wants to be seen tomorrow. She understands the risks and denies any fever or respiratory symptoms in herself. Pt will keep her appt tomorrow.

## 2019-02-26 NOTE — Telephone Encounter (Signed)
Patient was returning your call. She wanted to speak directly with you.

## 2019-02-26 NOTE — Telephone Encounter (Signed)
I called pt to discuss her appt tomorrow with Dr. Frances Furbish. No answer, left a message asking her to call me back.

## 2019-02-27 ENCOUNTER — Other Ambulatory Visit: Payer: Self-pay

## 2019-02-27 ENCOUNTER — Encounter: Payer: Self-pay | Admitting: Neurology

## 2019-02-27 ENCOUNTER — Ambulatory Visit: Payer: Medicare Other | Admitting: Neurology

## 2019-02-27 VITALS — BP 151/78 | HR 71 | Temp 98.9°F | Ht 60.0 in | Wt 202.0 lb

## 2019-02-27 DIAGNOSIS — R498 Other voice and resonance disorders: Secondary | ICD-10-CM

## 2019-02-27 DIAGNOSIS — G479 Sleep disorder, unspecified: Secondary | ICD-10-CM | POA: Diagnosis not present

## 2019-02-27 DIAGNOSIS — G250 Essential tremor: Secondary | ICD-10-CM

## 2019-02-27 DIAGNOSIS — R351 Nocturia: Secondary | ICD-10-CM

## 2019-02-27 MED ORDER — RANITIDINE HCL 150 MG PO TABS: 150.0000 mg | ORAL_TABLET | Freq: Every day | ORAL | 1 refills | Status: DC

## 2019-02-27 NOTE — Progress Notes (Addendum)
Subjective:    Patient ID: Tricia Ferguson is a 85 y.o. female.  HPI     Tricia Foley, MD, PhD [facility] [address], [address] [address] [place], [place] [address]  Dear Dr. Allyne Gee,  I saw your patient, Tricia Ferguson, upon your kind request in my neurologic clinic today for initial consultation of her head tremor. The patient is unaccompanied today. As you know, Tricia Ferguson is an 85 year old right-handed woman with an underlying medical history of vitamin D deficiency, peripheral vascular disease, hypertension, chronic kidney disease, arthritis and obesity, who reports a several month history of head tremor. I reviewed your office note from 01/04/2019.symptoms have been progressive, she endorses a family history of tremor in her father who had a head tremor, and youngest sister has a head tremor. Patient is the second oldest of a total of 7 children, she had 3 brothers, 2 passed away and has 3 sisters alive. 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 Other than that she is not particularly bothered by the tremor O notices it the tremor. She had successful cataract surgeries recently, right side was done in August 2019, left side was done in September 2019. She has not required any prescription eyeglasses, likes to use eyeglasses nevertheless with no strength, stating that she is so used to using eyeglasses. She also has readers if needed. She has been referred to physical therapy and they reached out to her but she has not called him back for scheduling an appointment for her balance issue. She had a recent fall. She has right knee problems and recently received an injection into the right knee less than a week ago and is status post right knee arthroscopic surgery in 2005. She has a cane but does not typically use it. She has not had much in the way of hand tremor, with the exception of a  very slight intermittent hand tremor which is not very bothersome to her. She is widowed and lives alone, she has 3 children. She quit smoking in 2005, does not utilize alcohol and drinks caffeine occasionally, not daily.  She reduced her caffeine intake years ago because of a remote history of palpitations. She has noted a voice tremor. Of note, she does not sleep very well. It is difficult for her to go to sleep and stay asleep. She has significant nocturia up to 5 times per average night, does not know if she snores however. She has been reluctant to try melatonin which was recommended by you, per pt.  She had recent blood work through your office which I reviewed including TSH.  Her Past Medical History Is Significant For: Past Medical History:  Diagnosis Date  . Chronic kidney disease   . Hypertension   . Peripheral vascular disease (HCC)   . Vitamin D deficiency disease     Her Past Surgical History Is Significant For: Past Surgical History:  Procedure Laterality Date  . ABDOMINAL HYSTERECTOMY    . HEMORRHOID SURGERY      Her Family History Is Significant For: Family History  Problem Relation Age of Onset  . Hypertension Mother   . Throat cancer Mother   . Prostate cancer Father   . Tremor Father     Her Social History Is Significant For: Social History   Socioeconomic History  . Marital status: Widowed    Spouse name: Not on file  . Number  of children: Not on file  . Years of education: Not on file  . Highest education level: Not on file  Occupational History  . Not on file  Social Needs  . Financial resource strain: Not on file  . Food insecurity:    Worry: Not on file    Inability: Not on file  . Transportation needs:    Medical: Not on file    Non-medical: Not on file  Tobacco Use  . Smoking status: Former Smoker    Packs/day: 20.00    Types: Cigarettes    Last attempt to quit: 2004    Years since quitting: 16.2  . Smokeless tobacco: Never Used   Substance and Sexual Activity  . Alcohol use: No  . Drug use: No  . Sexual activity: Not on file  Lifestyle  . Physical activity:    Days per week: Not on file    Minutes per session: Not on file  . Stress: Not on file  Relationships  . Social connections:    Talks on phone: Not on file    Gets together: Not on file    Attends religious service: Not on file    Active member of club or organization: Not on file    Attends meetings of clubs or organizations: Not on file    Relationship status: Not on file  Other Topics Concern  . Not on file  Social History Narrative  . Not on file    Her Allergies Are:  Allergies  Allergen Reactions  . Amoxicillin Itching  . Penicillins Hives  . Potassium-Containing Compounds Itching  :   Her Current Medications Are:  Outpatient Encounter Medications as of 02/27/2019  Medication Sig  . Ascorbic Acid (VITAMIN C PO) Take by mouth.  . calcium carbonate (TUMS - DOSED IN MG ELEMENTAL CALCIUM) 500 MG chewable tablet Chew 2 tablets by mouth daily.   . Cholecalciferol (VITAMIN D3 PO) Take by mouth.  . fish oil-omega-3 fatty acids 1000 MG capsule Take 2 g by mouth daily.  . Garlic 1000 MG CAPS Take by mouth.  Marland Kitchen. glucosamine-chondroitin 500-400 MG tablet Take 1 tablet by mouth 3 (three) times daily.  Marland Kitchen. lisinopril (PRINIVIL,ZESTRIL) 5 MG tablet TAKE 1 TABLET BY MOUTH DAILY  . loratadine (CLARITIN) 10 MG tablet Take 10 mg by mouth daily.  . Multiple Vitamin (MULTIVITAMIN) tablet Take 1 tablet by mouth daily.  . ranitidine (ZANTAC) 150 MG tablet Take 1 tablet (150 mg total) by mouth daily.   No facility-administered encounter medications on file as of 02/27/2019.   :  Review of Systems:  Out of a complete 14 point review of systems, all are reviewed and negative with the exception of these symptoms as listed below: She also reports restless leg symptoms and palpitations as well as joint pain and allergies on her review of systems sheet. Review of  Systems  Neurological:       Pt presents today to discuss her head tremor. She has noticed it for a few months but it seems to be getting worse. She denies any tremors in her hands.    Objective:  Neurological Exam  Physical Exam Physical Examination:   Vitals:   02/27/19 1432  BP: (!) 151/78  Pulse: 71  Temp: 98.9 F (37.2 C)    General Examination: The patient is a very pleasant 85 y.o. female in no acute distress. She appears well-developed and well-nourished and well groomed.   HEENT: Normocephalic, atraumatic, pupils are equal, round and  reactive to light and accommodation. Extraocular tracking is good without limitation to gaze excursion or nystagmus noted. Normal smooth pursuit is noted. Hearing is grossly intact. Face is symmetric with normal facial animation and normal facial sensation. Speech is clear with no dysarthria noted. There is no hypophonia. There is a mild side-to-side head tremor and a mild to moderate voice tremor. Neck is supple, no nuchal rigidity noted. There are no carotid bruits on auscultation. Oropharynx exam reveals: mild mouth dryness, adequate dental hygiene and moderate airway crowding, due to redundant soft palate, thicker soft palate and tonsils in place. Tongue protrudes centrally and palate elevates symmetrically.   Chest: Clear to auscultation without wheezing, rhonchi or crackles noted.  Heart: S1+S2+0, regular and normal without murmurs, rubs or gallops noted.   Abdomen: Soft, non-tender and non-distended with normal bowel sounds appreciated on auscultation.  Extremities: There is trace pitting edema in the distal lower extremities bilaterally. Pedal pulses are intact.  Skin: Warm and dry without trophic changes noted.  Musculoskeletal: exam reveals no obvious joint deformities, tenderness or joint swelling or erythema.   Neurologically:  Mental status: The patient is awake, alert and oriented in all 4 spheres. Her immediate and remote  memory, attention, language skills and fund of knowledge are appropriate. There is no evidence of aphasia, agnosia, apraxia or anomia. Speech is clear with normal prosody and enunciation. Thought process is linear. Mood is normal and affect is normal.  Cranial nerves II - XII are as described above under HEENT exam. In addition: shoulder shrug is normal with equal shoulder height noted. Motor exam: Normal bulk, strength and tone is noted. There is no drift, resting tremor or rebound.  On 02/27/2019: on Archimedes spiral drawing she has slight insecurity with her left, nondominant hand, right hand is fairly good with tracing the spiral. Handwriting is legible, not micrographic, not particularly tremulous. She has no significant postural or action tremor in the hands. She has no resting tremor in the lower extremities or upper extremities.  Romberg is negative. Reflexes are 1+ in the upper extremities and decreased in the lower extremities, fine motor skills and coordination: intact with normal finger taps, normal hand movements, normal rapid alternating patting, normal foot taps and normal foot agility.  Cerebellar testing: No dysmetria or intention tremor on finger to nose testing. Heel to shin is difficult bilaterally, with limited ability to raise her heel high enough. No truncal or gait ataxia.  Sensory exam: intact to light touch in the upper and lower extremities.  Gait, station and balance: She stands without significant difficulty, posture is stooped as insignificant forward tilt in the lumbar spine with loss of lumbar lordosis noted. She walks perhaps with a slight limp on the right, no shuffling, preserved arm swing noted, no walking aids here today. She turns well. Tandem walk is not tested secondary to safety concern.  Assessment and plan:   In summary, Tricia Ferguson is a very pleasant 85 y.o.-year old female with an underlying medical history of vitamin D deficiency, peripheral vascular  disease, hypertension, chronic kidney disease, arthritis and obesity, who presents for evaluation of her head tremor. On examination, she has a head tremor as well as a voice tremor, history and physical examination supporting the diagnosis of essential tremor. Her overall findings are on the mild side, she has no significant hand tremor. She is reassured that she has no signs of parkinsonism. I would recommend that she go through with the physical therapy evaluation and treatment as  suggested by you. We talked about gait safety, fall risk, symptomatically treatments for tremors. I would currently not favor embarking on any symptomatic treatment for tremors for several reasons: Tremors that are midline, particularly voice and had tremors are more difficult to treat successfully and often require higher doses of the medications we try. I did talk to her about potentially utilizing medication down the road if needed. We talked about beta blocker be the first-line treatment versus Mysoline/primidone. Given her age, she is at high risk for potential side effects with medications including balance problems and sedation, the latter is a potential side effect of both a beta blocker and primidone and balance problems can be seen with primidone. She is agreeable to monitoring her symptoms, I suggested a reevaluation in about 6 months, sooner if needed. We talked about potential tremor triggers including sleep deprivation, dehydration, caffeine, certain medications, anxiety, stress. Of note, she reports that she does not sleep very well. She has been advised to try melatonin by you as I understand. She has been somewhat afraid to take it daily. She is advised that melatonin has not been found to be addictive or habit forming. She is encouraged to talk to her family about potential sleep disorder such as sleep disordered breathing and snoring. She does have a crowded appearing airway and is obese. In addition, she also  reports significant nocturia and all of these could be correlated with underlying obstructive sleep apnea. She is encouraged to think about sleep study testing. She is willing to talk to her family about snoring and monitor his symptoms in that regard, she is willing to think about sleep study testing for sleep apnea. We mutually agreed to reconvene in about 6 months, in the interim if she would like to proceed with a sleep study and would be happy to order one. I answered all her questions today and she was in agreement.  Thank you very much for allowing me to participate in the care of this nice patient. If I can be of any further assistance to you please do not hesitate to call me at [phone].  Sincerely,   Tricia Foley, MD, PhD

## 2019-02-27 NOTE — Patient Instructions (Addendum)
You have a mild head tremor, also some in your voice, no significant tremor of the hands.   I do not see any signs or symptoms of parkinson's like disease or what we call parkinsonism.   For your tremor, I would not recommend any new medication for fear of side effects (especially sleepiness) or medication interactions, especially in light of balance problems reported. Please follow through with physical therapy.   Please remember, that any kind of tremor may be exacerbated by anxiety, anger, nervousness, excitement, dehydration, sleep deprivation, by caffeine, and low blood sugar values or blood sugar fluctuations. Some medications can exacerbate tremors, this includes certain antidepressant medications.   You can try Melatonin at night for sleep: take 1 mg to 3 mg, one to 2 hours before your bedtime. You can go up to 5 mg if needed. It is over the counter and comes in pill form, chewable form and spray, if you prefer.    Please ask family and friends if you snore and if so, how loud it is, and if you have breathing related issues in your sleep, such as: snorting sounds, choking sounds, pauses in your breathing or shallow breathing events. These may be symptoms of obstructive sleep apnea (OSA). I would be happy to do a sleep study if needed.

## 2019-02-28 ENCOUNTER — Ambulatory Visit: Payer: Medicare Other

## 2019-02-28 ENCOUNTER — Other Ambulatory Visit: Payer: Medicare Other

## 2019-03-09 ENCOUNTER — Other Ambulatory Visit: Payer: Medicare Other

## 2019-03-19 DIAGNOSIS — H353221 Exudative age-related macular degeneration, left eye, with active choroidal neovascularization: Secondary | ICD-10-CM | POA: Diagnosis not present

## 2019-04-20 DIAGNOSIS — H353221 Exudative age-related macular degeneration, left eye, with active choroidal neovascularization: Secondary | ICD-10-CM | POA: Diagnosis not present

## 2019-04-27 ENCOUNTER — Telehealth: Payer: Self-pay

## 2019-04-27 NOTE — Telephone Encounter (Signed)
Patient notified

## 2019-04-27 NOTE — Telephone Encounter (Signed)
-----   Message from Dorothyann Peng, MD sent at 04/27/2019 10:23 AM EDT ----- She can try pepcid over the counter.  ----- Message ----- From: Mariam Dollar, CMA Sent: 04/27/2019  10:02 AM EDT To: Dorothyann Peng, MD  The pt called and wants to know if there is something else she can take to replace the ranitidine because she got a letter that it can cause cancer.

## 2019-05-01 ENCOUNTER — Other Ambulatory Visit: Payer: Self-pay | Admitting: Internal Medicine

## 2019-05-25 DIAGNOSIS — H353221 Exudative age-related macular degeneration, left eye, with active choroidal neovascularization: Secondary | ICD-10-CM | POA: Diagnosis not present

## 2019-06-08 ENCOUNTER — Ambulatory Visit (INDEPENDENT_AMBULATORY_CARE_PROVIDER_SITE_OTHER): Payer: Medicare Other

## 2019-06-08 ENCOUNTER — Ambulatory Visit (HOSPITAL_COMMUNITY)
Admission: EM | Admit: 2019-06-08 | Discharge: 2019-06-08 | Disposition: A | Discharge status: Home / Self Care | Payer: Medicare Other | Attending: Internal Medicine | Admitting: Internal Medicine

## 2019-06-08 ENCOUNTER — Other Ambulatory Visit: Payer: Self-pay

## 2019-06-08 ENCOUNTER — Encounter (HOSPITAL_COMMUNITY): Payer: Self-pay

## 2019-06-08 ENCOUNTER — Telehealth: Payer: Self-pay

## 2019-06-08 DIAGNOSIS — M25551 Pain in right hip: Secondary | ICD-10-CM

## 2019-06-08 MED ORDER — ACETAMINOPHEN 500 MG PO TABS: 500.0000 mg | ORAL_TABLET | Freq: Four times a day (QID) | ORAL | 0 refills | Status: DC | PRN

## 2019-06-08 MED ORDER — NAPROXEN 375 MG PO TABS: 375.0000 mg | ORAL_TABLET | Freq: Two times a day (BID) | ORAL | 0 refills | Status: DC

## 2019-06-08 NOTE — Telephone Encounter (Signed)
Pt called to let us know she is having a lot of side pain. Pt was advised to go to urgent care due to use not being open on fridays

## 2019-06-08 NOTE — ED Triage Notes (Signed)
Pt presents with hip & leg pain on her right side from unknown source.  Pt states she feels as though an abscess may be forming along her sacral line.

## 2019-06-08 NOTE — ED Provider Notes (Signed)
[facility]    CSN: [id number] Arrival date & time: 06/08/19  1450      History   Chief Complaint Chief Complaint  Patient presents with  . Hip/Leg Pain    HPI Tricia Ferguson is a 85 y.o. female to the urgent care with complaints of right hip pain which started a few days ago.  Pain is severe, aggravated by walking and bearing weight on it.  No known relieving factors.  Pain radiates into the gluteal area.  No numbness or tingling in the lower extremities.  No weakness in the right leg.  Patient denies any fall or trauma to the right hip.  No weight loss.   HPI  Past Medical History:  Diagnosis Date  . Chronic kidney disease   . Hypertension   . Peripheral vascular disease (HCC)   . Vitamin D deficiency disease     Patient Active Problem List   Diagnosis Date Noted  . Essential hypertension, benign 01/04/2019  . Estrogen deficiency 01/04/2019  . Class 2 severe obesity due to excess calories with serious comorbidity and body mass index (BMI) of 35.0 to 35.9 in adult [facility](HCC) 01/04/2019    Past Surgical History:  Procedure Laterality Date  . ABDOMINAL HYSTERECTOMY    . HEMORRHOID SURGERY      OB History   No obstetric history on file.      Home Medications    Prior to Admission medications   Medication Sig Start Date End Date Taking? Authorizing Provider  Ascorbic Acid (VITAMIN C PO) Take by mouth.    [provider]  calcium carbonate (TUMS - DOSED IN MG ELEMENTAL CALCIUM) 500 MG chewable tablet Chew 2 tablets by mouth daily.     [provider]  Cholecalciferol (VITAMIN D3 PO) Take by mouth.    [provider]  fish oil-omega-3 fatty acids 1000 MG capsule Take 2 g by mouth daily.    [provider]  Garlic 1000 MG CAPS Take by mouth.    [provider]  glucosamine-chondroitin 500-400 MG tablet Take 1 tablet by mouth 3 (three) times daily.    [provider]  lisinopril (ZESTRIL) 5 MG tablet  TAKE 1 TABLET BY MOUTH DAILY 05/01/19   [name], [name], MD  loratadine (CLARITIN) 10 MG tablet Take 10 mg by mouth daily.    [provider]  Multiple Vitamin (MULTIVITAMIN) tablet Take 1 tablet by mouth daily.    [provider]  ranitidine (ZANTAC) 150 MG tablet TAKE 1 TABLET BY MOUTH EVERY DAY 02/28/19   [name], [name], MD  ranitidine (ZANTAC) 150 MG tablet Take 1 tablet (150 mg total) by mouth daily. 02/27/19   [name], [name], MD    Family History Family History  Problem Relation Age of Onset  . Hypertension Mother   . Throat cancer Mother   . Prostate cancer Father   . Tremor Father     Social History Social History   Tobacco Use  . Smoking status: Former Smoker    Packs/day: 20.00    Types: Cigarettes    Quit date: 2004    Years since quitting: 16.4  . Smokeless tobacco: Never Used  Substance Use Topics  . Alcohol use: No  . Drug use: No     Allergies   Amoxicillin, Penicillins, and Potassium-containing compounds   Review of Systems Review of Systems  Constitutional: Negative for activity change, appetite change, diaphoresis and fatigue.  HENT: Negative.   Respiratory: Negative.   Cardiovascular:  Negative for chest pain and palpitations.  Gastrointestinal: Negative for abdominal distention, abdominal pain, constipation, diarrhea, nausea and vomiting.  Musculoskeletal: Positive for arthralgias, gait problem and myalgias. Negative for back pain, neck pain and neck stiffness.  Neurological: Negative for dizziness, weakness, numbness and headaches.     Physical Exam Triage Vital Signs ED Triage Vitals  Enc Vitals Group     BP 06/08/19 1528 (!) 157/64     Pulse Rate 06/08/19 1528 72     Resp 06/08/19 1528 16     Temp 06/08/19 1528 98.8 F (37.1 C)     Temp Source 06/08/19 1528 Oral     SpO2 06/08/19 1528 100 %     Weight --      Height --      Head Circumference --      Peak Flow --      Pain Score 06/08/19 1529 8     Pain Loc --       Pain Edu? --      Excl. in GC? --    No data found.  Updated Vital Signs BP (!) 157/64 (BP Location: Left Arm)   Pulse 72   Temp 98.8 F (37.1 C) (Oral)   Resp 16   SpO2 100%   Visual Acuity Right Eye Distance:   Left Eye Distance:   Bilateral Distance:    Right Eye Near:   Left Eye Near:    Bilateral Near:     Physical Exam Constitutional:      General: She is in acute distress.     Appearance: She is not ill-appearing.  Cardiovascular:     Pulses: Normal pulses.     Heart sounds: Normal heart sounds.  Pulmonary:     Effort: Pulmonary effort is normal. No respiratory distress.     Breath sounds: Normal breath sounds. No wheezing or rales.  Abdominal:     General: Bowel sounds are normal.     Palpations: Abdomen is soft.  Musculoskeletal: Normal range of motion.        General: No swelling, tenderness, deformity or signs of injury.  Skin:    General: Skin is warm.     Capillary Refill: Capillary refill takes less than 2 seconds.     Coloration: Skin is not jaundiced.     Findings: No bruising or lesion.  Neurological:     General: No focal deficit present.     Mental Status: She is alert.      UC Treatments / Results  Labs (all labs ordered are listed, but only abnormal results are displayed) Labs Reviewed - No data to display  EKG None  Radiology No results found.  Procedures Procedures (including critical care time)  Medications Ordered in UC Medications - No data to display  Initial Impression / Assessment and Plan / UC Course  I have reviewed the triage vital signs and the nursing notes.  Pertinent labs & imaging results that were available during my care of the patient were reviewed by me and considered in my medical decision making (see chart for details).     1.  Right hip pain: X-ray of the right hip and pelvis is negative for acute fracture or arthritis of the joint space.  Patient complained about abscess in the natal cleft.   Examination in the gluteal area was negative for an abscess.  No swelling was noted.  Patient has a history of pilonidal cyst.  Patient was prescribed naproxen to use as needed.  Return to urgent care if pain is persistent or gets worse. Final Clinical Impressions(s) / UC Diagnoses   Final diagnoses:  None   Discharge Instructions   None    ED Prescriptions    None     Controlled Substance Prescriptions [place] Controlled Substance Registry consulted? No   Chase Picket, MD 06/08/19 1721

## 2019-06-27 ENCOUNTER — Other Ambulatory Visit: Payer: Self-pay

## 2019-06-27 ENCOUNTER — Encounter: Payer: Medicare Other | Admitting: Internal Medicine

## 2019-06-27 ENCOUNTER — Ambulatory Visit (INDEPENDENT_AMBULATORY_CARE_PROVIDER_SITE_OTHER): Payer: Medicare Other

## 2019-06-27 VITALS — BP 128/64 | HR 77 | Temp 97.5°F | Ht 60.4 in | Wt 204.4 lb

## 2019-06-27 DIAGNOSIS — Z0000 Encounter for general adult medical examination without abnormal findings: Secondary | ICD-10-CM

## 2019-06-27 DIAGNOSIS — Z23 Encounter for immunization: Secondary | ICD-10-CM | POA: Diagnosis not present

## 2019-06-27 MED ORDER — BOOSTRIX 5-2.5-18.5 LF-MCG/0.5 IM SUSP: 0.5000 mL | Freq: Once | INTRAMUSCULAR | 0 refills | Status: AC

## 2019-06-27 NOTE — Progress Notes (Signed)
Subjective:   Tricia Ferguson is a 85 y.o.Tricia Ferguson female who presents for Medicare Annual (Subsequent) preventive examination.  Review of Systems:  n/a Cardiac Risk Factors include: advanced age (>5155men, 68>65 women);hypertension;obesity (BMI >30kg/m2)     Objective:     Vitals: BP 128/64 (BP Location: Left Arm, Patient Position: Sitting, Cuff Size: Normal)   Pulse 77   Temp (!) 97.5 F (36.4 C) (Oral)   Ht 5' 0.4" (1.534 m)   Wt 204 lb 6.4 oz (92.7 kg)   SpO2 97%   BMI 39.39 kg/m   Body mass index is 39.39 kg/m.  Advanced Directives 06/27/2019 09/29/2017  Does Patient Have a Medical Advance Directive? No No  Would patient like information on creating a medical advance directive? Yes (MAU/Ambulatory/Procedural Areas - Information given) -    Tobacco Social History   Tobacco Use  Smoking Status Former Smoker  . Packs/day: 20.00  . Types: Cigarettes  . Quit date: 2004  . Years since quitting: 16.5  Smokeless Tobacco Never Used     Counseling given: Not Answered   Clinical Intake:  Pre-visit preparation completed: Yes  Pain : 0-10 Pain Score: 5  Pain Type: Chronic pain Pain Location: Leg Pain Orientation: Right Pain Radiating Towards: from thigh to ankle Pain Descriptors / Indicators: Aching Pain Onset: More than a month ago Pain Frequency: Constant Pain Relieving Factors: rest helps  Pain Relieving Factors: rest helps  Nutritional Status: BMI > 30  Obese Nutritional Risks: None Diabetes: No  How often do you need to have someone help you when you read instructions, pamphlets, or other written materials from your doctor or pharmacy?: 1 - Never What is the last grade level you completed in school?: 12th grade  Interpreter Needed?: No  Information entered by :: NAllen LPN  Past Medical History:  Diagnosis Date  . Chronic kidney disease   . Hypertension   . Peripheral vascular disease (HCC)   . Vitamin D deficiency disease    Past Surgical History:   Procedure Laterality Date  . ABDOMINAL HYSTERECTOMY    . HEMORRHOID SURGERY     Family History  Problem Relation Age of Onset  . Hypertension Mother   . Throat cancer Mother   . Prostate cancer Father   . Tremor Father    Social History   Socioeconomic History  . Marital status: Widowed    Spouse name: Not on file  . Number of children: Not on file  . Years of education: Not on file  . Highest education level: Not on file  Occupational History  . Occupation: retired  Engineer, productionocial Needs  . Financial resource strain: Not hard at all  . Food insecurity    Worry: Never true    Inability: Never true  . Transportation needs    Medical: No    Non-medical: No  Tobacco Use  . Smoking status: Former Smoker    Packs/day: 20.00    Types: Cigarettes    Quit date: 2004    Years since quitting: 16.5  . Smokeless tobacco: Never Used  Substance and Sexual Activity  . Alcohol use: No  . Drug use: No  . Sexual activity: Not Currently  Lifestyle  . Physical activity    Days per week: 7 days    Minutes per session: 30 min  . Stress: Not at all  Relationships  . Social Musicianconnections    Talks on phone: Not on file    Gets together: Not on file  Attends religious service: Not on file    Active member of club or organization: Not on file    Attends meetings of clubs or organizations: Not on file    Relationship status: Not on file  Other Topics Concern  . Not on file  Social History Narrative  . Not on file    Outpatient Encounter Medications as of 06/27/2019  Medication Sig  . Ascorbic Acid (VITAMIN C PO) Take by mouth.  . calcium carbonate (TUMS - DOSED IN MG ELEMENTAL CALCIUM) 500 MG chewable tablet Chew 2 tablets by mouth daily.   . Cholecalciferol (VITAMIN D3 PO) Take by mouth.  . Famotidine-Ca Carb-Mag Hydrox (PEPCID COMPLETE PO) Take 1 tablet by mouth.  . fish oil-omega-3 fatty acids 1000 MG capsule Take 2 g by mouth daily.  . Garlic 0865 MG CAPS Take by mouth.  Marland Kitchen  glucosamine-chondroitin 500-400 MG tablet Take 1 tablet by mouth 3 (three) times daily.  Marland Kitchen lisinopril (ZESTRIL) 5 MG tablet TAKE 1 TABLET BY MOUTH DAILY  . loratadine (CLARITIN) 10 MG tablet Take 10 mg by mouth daily.  . Multiple Vitamin (MULTIVITAMIN) tablet Take 1 tablet by mouth daily.  . naproxen (NAPROSYN) 375 MG tablet Take 1 tablet (375 mg total) by mouth 2 (two) times daily. (Patient not taking: Reported on 06/27/2019)  . ranitidine (ZANTAC) 150 MG tablet TAKE 1 TABLET BY MOUTH EVERY DAY (Patient not taking: Reported on 06/27/2019)  . ranitidine (ZANTAC) 150 MG tablet Take 1 tablet (150 mg total) by mouth daily. (Patient not taking: Reported on 06/27/2019)  . Tdap (BOOSTRIX) 5-2.5-18.5 LF-MCG/0.5 injection Inject 0.5 mLs into the muscle once for 1 dose.   No facility-administered encounter medications on file as of 06/27/2019.     Activities of Daily Living In your present state of health, do you have any difficulty performing the following activities: 06/27/2019  Hearing? N  Vision? N  Difficulty concentrating or making decisions? N  Walking or climbing stairs? Y  Comment due to knee pain  Dressing or bathing? N  Doing errands, shopping? N  Preparing Food and eating ? N  Using the Toilet? N  In the past six months, have you accidently leaked urine? N  Do you have problems with loss of bowel control? N  Managing your Medications? N  Managing your Finances? N  Housekeeping or managing your Housekeeping? N  Some recent data might be hidden    Patient Care Team: Glendale Chard, MD as PCP - General (Internal Medicine) Marygrace Drought, MD as Consulting Physician (Ophthalmology)    Assessment:   This is a routine wellness examination for Tricia Ferguson.  Exercise Activities and Dietary recommendations Current Exercise Habits: Home exercise routine, Type of exercise: stretching;Other - see comments(own self routine), Time (Minutes): 30, Frequency (Times/Week): 7, Weekly Exercise  (Minutes/Week): 210, Intensity: Intense  Goals    . Patient Stated     Stay healthy       Fall Risk Fall Risk  06/27/2019 01/04/2019 10/06/2018  Falls in the past year? 1 1 No  Number falls in past yr: 0 0 -  Comment tripped - -  Injury with Fall? 0 0 -  Risk for fall due to : History of fall(s);Medication side effect - -  Follow up Falls evaluation completed;Education provided;Falls prevention discussed - -   Is the patient's home free of loose throw rugs in walkways, pet beds, electrical cords, etc?   yes      Grab bars in the bathroom? no  Handrails on the stairs?   yes      Adequate lighting?   yes  Timed Get Up and Go performed: n/a  Depression Screen PHQ 2/9 Scores 06/27/2019 01/04/2019 10/06/2018  PHQ - 2 Score 0 0 0  PHQ- 9 Score 1 - -     Cognitive Function     6CIT Screen 06/27/2019  What Year? 0 points  What month? 0 points  What time? 0 points  Count back from 20 0 points  Months in reverse 0 points  Repeat phrase 0 points  Total Score 0    Immunization History  Administered Date(s) Administered  . Influenza-Unspecified 08/28/2018    Qualifies for Shingles Vaccine? yes  Screening Tests Health Maintenance  Topic Date Due  . TETANUS/TDAP  01/16/1953  . INFLUENZA VACCINE  07/14/2019  . DEXA SCAN  Completed  . PNA vac Low Risk Adult  Completed    Cancer Screenings: Lung: Low Dose CT Chest recommended if Age 20-80 years, 30 pack-year currently smoking OR have quit w/in 15years. Patient does not qualify. Breast:  Up to date on Mammogram? Yes   Up to date of Bone Density/Dexa? Yes Colorectal: up to date  Additional Screenings: : Hepatitis C Screening: n/a     Plan:    6 CIT was 0. Wants to stay healthy. States had a colonoscopy scheduled, but has been postponed due to coronavirus. Tetanus vaccine sent to pharmacy.   I have personally reviewed and noted the following in the patient's chart:   . Medical and social history . Use of alcohol,  tobacco or illicit drugs  . Current medications and supplements . Functional ability and status . Nutritional status . Physical activity . Advanced directives . List of other physicians . Hospitalizations, surgeries, and ER visits in previous 12 months . Vitals . Screenings to include cognitive, depression, and falls . Referrals and appointments  In addition, I have reviewed and discussed with patient certain preventive protocols, quality metrics, and best practice recommendations. A written personalized care plan for preventive services as well as general preventive health recommendations were provided to patient.     Barb Merinoickeah E Aayliah Rotenberry, LPN  [id number]/15/2020

## 2019-06-27 NOTE — Patient Instructions (Signed)
[name] , Thank you for taking time to come for your Medicare Wellness Visit. I appreciate your ongoing commitment to your health goals. Please review the following plan we discussed and let me know if I can assist you in the future.   Screening recommendations/referrals: Colonoscopy: 03/2013 Mammogram: 01/2019 Bone Density: scheduled Recommended yearly ophthalmology/optometry visit for glaucoma screening and checkup Recommended yearly dental visit for hygiene and checkup  Vaccinations: Influenza vaccine: 08/2018 Pneumococcal vaccine: 04/2017 Tdap vaccine: sent to pharmacy Shingles vaccine: discussed    Advanced directives: Advance directive discussed with you today. I have provided a copy for you to complete at home and have notarized. Once this is complete please bring a copy in to our office so we can scan it into your chart.   Conditions/risks identified: obesity  Next appointment: 07/01/2020 at 8:45   Preventive Care 48 Years and Older, Female Preventive care refers to lifestyle choices and visits with your health care provider that can promote health and wellness. What does preventive care include?  A yearly physical exam. This is also called an annual well check.  Dental exams once or twice a year.  Routine eye exams. Ask your health care provider how often you should have your eyes checked.  Personal lifestyle choices, including:  Daily care of your teeth and gums.  Regular physical activity.  Eating a healthy diet.  Avoiding tobacco and drug use.  Limiting alcohol use.  Practicing safe sex.  Taking low-dose aspirin every day.  Taking vitamin and mineral supplements as recommended by your health care provider. What happens during an annual well check? The services and screenings done by your health care provider during your annual well check will depend on your age, overall health, lifestyle risk factors, and family history of disease. Counseling  Your  health care provider may ask you questions about your:  Alcohol use.  Tobacco use.  Drug use.  Emotional well-being.  Home and relationship well-being.  Sexual activity.  Eating habits.  History of falls.  Memory and ability to understand (cognition).  Work and work Statistician.  Reproductive health. Screening  You may have the following tests or measurements:  Height, weight, and BMI.  Blood pressure.  Lipid and cholesterol levels. These may be checked every 5 years, or more frequently if you are over 52 years old.  Skin check.  Lung cancer screening. You may have this screening every year starting at age 54 if you have a 30-pack-year history of smoking and currently smoke or have quit within the past 15 years.  Fecal occult blood test (FOBT) of the stool. You may have this test every year starting at age 58.  Flexible sigmoidoscopy or colonoscopy. You may have a sigmoidoscopy every 5 years or a colonoscopy every 10 years starting at age 21.  Hepatitis C blood test.  Hepatitis B blood test.  Sexually transmitted disease (STD) testing.  Diabetes screening. This is done by checking your blood sugar (glucose) after you have not eaten for a while (fasting). You may have this done every 1-3 years.  Bone density scan. This is done to screen for osteoporosis. You may have this done starting at age 49.  Mammogram. This may be done every 1-2 years. Talk to your health care provider about how often you should have regular mammograms. Talk with your health care provider about your test results, treatment options, and if necessary, the need for more tests. Vaccines  Your health care provider may recommend certain vaccines, such  as:  Influenza vaccine. This is recommended every year.  Tetanus, diphtheria, and acellular pertussis (Tdap, Td) vaccine. You may need a Td booster every 10 years.  Zoster vaccine. You may need this after age 27.  Pneumococcal 13-valent  conjugate (PCV13) vaccine. One dose is recommended after age 101.  Pneumococcal polysaccharide (PPSV23) vaccine. One dose is recommended after age 1. Talk to your health care provider about which screenings and vaccines you need and how often you need them. This information is not intended to replace advice given to you by your health care provider. Make sure you discuss any questions you have with your health care provider. Document Released: 12/26/2015 Document Revised: 08/18/2016 Document Reviewed: 09/30/2015 Elsevier Interactive Patient Education  2017 [place] Prevention in the Home Falls can cause injuries. They can happen to people of all ages. There are many things you can do to make your home safe and to help prevent falls. What can I do on the outside of my home?  Regularly fix the edges of walkways and driveways and fix any cracks.  Remove anything that might make you trip as you walk through a door, such as a raised step or threshold.  Trim any bushes or trees on the path to your home.  Use bright outdoor lighting.  Clear any walking paths of anything that might make someone trip, such as rocks or tools.  Regularly check to see if handrails are loose or broken. Make sure that both sides of any steps have handrails.  Any raised decks and porches should have guardrails on the edges.  Have any leaves, snow, or ice cleared regularly.  Use sand or salt on walking paths during winter.  Clean up any spills in your garage right away. This includes oil or grease spills. What can I do in the bathroom?  Use night lights.  Install grab bars by the toilet and in the tub and shower. Do not use towel bars as grab bars.  Use non-skid mats or decals in the tub or shower.  If you need to sit down in the shower, use a plastic, non-slip stool.  Keep the floor dry. Clean up any water that spills on the floor as soon as it happens.  Remove soap buildup in the tub or  shower regularly.  Attach bath mats securely with double-sided non-slip rug tape.  Do not have throw rugs and other things on the floor that can make you trip. What can I do in the bedroom?  Use night lights.  Make sure that you have a light by your bed that is easy to reach.  Do not use any sheets or blankets that are too big for your bed. They should not hang down onto the floor.  Have a firm chair that has side arms. You can use this for support while you get dressed.  Do not have throw rugs and other things on the floor that can make you trip. What can I do in the kitchen?  Clean up any spills right away.  Avoid walking on wet floors.  Keep items that you use a lot in easy-to-reach places.  If you need to reach something above you, use a strong step stool that has a grab bar.  Keep electrical cords out of the way.  Do not use floor polish or wax that makes floors slippery. If you must use wax, use non-skid floor wax.  Do not have throw rugs and other things on the  floor that can make you trip. What can I do with my stairs?  Do not leave any items on the stairs.  Make sure that there are handrails on both sides of the stairs and use them. Fix handrails that are broken or loose. Make sure that handrails are as long as the stairways.  Check any carpeting to make sure that it is firmly attached to the stairs. Fix any carpet that is loose or worn.  Avoid having throw rugs at the top or bottom of the stairs. If you do have throw rugs, attach them to the floor with carpet tape.  Make sure that you have a light switch at the top of the stairs and the bottom of the stairs. If you do not have them, ask someone to add them for you. What else can I do to help prevent falls?  Wear shoes that:  Do not have high heels.  Have rubber bottoms.  Are comfortable and fit you well.  Are closed at the toe. Do not wear sandals.  If you use a stepladder:  Make sure that it is fully  opened. Do not climb a closed stepladder.  Make sure that both sides of the stepladder are locked into place.  Ask someone to hold it for you, if possible.  Clearly mark and make sure that you can see:  Any grab bars or handrails.  First and last steps.  Where the edge of each step is.  Use tools that help you move around (mobility aids) if they are needed. These include:  Canes.  Walkers.  Scooters.  Crutches.  Turn on the lights when you go into a dark area. Replace any light bulbs as soon as they burn out.  Set up your furniture so you have a clear path. Avoid moving your furniture around.  If any of your floors are uneven, fix them.  If there are any pets around you, be aware of where they are.  Review your medicines with your doctor. Some medicines can make you feel dizzy. This can increase your chance of falling. Ask your doctor what other things that you can do to help prevent falls. This information is not intended to replace advice given to you by your health care provider. Make sure you discuss any questions you have with your health care provider. Document Released: 09/25/2009 Document Revised: 05/06/2016 Document Reviewed: 01/03/2015 Elsevier Interactive Patient Education  2017 [employer].

## 2019-06-28 DIAGNOSIS — H353221 Exudative age-related macular degeneration, left eye, with active choroidal neovascularization: Secondary | ICD-10-CM | POA: Diagnosis not present

## 2019-07-06 ENCOUNTER — Other Ambulatory Visit: Payer: Self-pay

## 2019-07-06 ENCOUNTER — Ambulatory Visit (INDEPENDENT_AMBULATORY_CARE_PROVIDER_SITE_OTHER): Payer: Medicare Other | Admitting: Internal Medicine

## 2019-07-06 ENCOUNTER — Encounter: Payer: Self-pay | Admitting: Internal Medicine

## 2019-07-06 VITALS — BP 116/72 | HR 74 | Temp 98.6°F | Ht 60.4 in | Wt 204.4 lb

## 2019-07-06 DIAGNOSIS — Z0000 Encounter for general adult medical examination without abnormal findings: Secondary | ICD-10-CM | POA: Diagnosis not present

## 2019-07-06 DIAGNOSIS — Z6839 Body mass index (BMI) 39.0-39.9, adult: Secondary | ICD-10-CM

## 2019-07-06 DIAGNOSIS — R0683 Snoring: Secondary | ICD-10-CM

## 2019-07-06 DIAGNOSIS — R002 Palpitations: Secondary | ICD-10-CM | POA: Diagnosis not present

## 2019-07-06 DIAGNOSIS — I10 Essential (primary) hypertension: Secondary | ICD-10-CM | POA: Diagnosis not present

## 2019-07-06 LAB — POCT URINALYSIS DIPSTICK
Bilirubin, UA: NEGATIVE
Blood, UA: NEGATIVE
Glucose, UA: NEGATIVE
Ketones, UA: NEGATIVE
Leukocytes, UA: NEGATIVE
Nitrite, UA: NEGATIVE
Protein, UA: NEGATIVE
Spec Grav, UA: 1.02 (ref 1.010–1.025)
Urobilinogen, UA: 0.2 E.U./dL
pH, UA: 5.5 (ref 5.0–8.0)

## 2019-07-06 LAB — POCT UA - MICROALBUMIN
Albumin/Creatinine Ratio, Urine, POC: <30
Creatinine, POC: 200 mg/dL
Microalbumin Ur, POC: 10 mg/L

## 2019-07-06 NOTE — Patient Instructions (Signed)
Health Maintenance, Female Adopting a healthy lifestyle and getting preventive care are important in promoting health and wellness. Ask your health care provider about:  The right schedule for you to have regular tests and exams.  Things you can do on your own to prevent diseases and keep yourself healthy. What should I know about diet, weight, and exercise? Eat a healthy diet   Eat a diet that includes plenty of vegetables, fruits, low-fat dairy products, and lean protein.  Do not eat a lot of foods that are high in solid fats, added sugars, or sodium. Maintain a healthy weight Body mass index (BMI) is used to identify weight problems. It estimates body fat based on height and weight. Your health care provider can help determine your BMI and help you achieve or maintain a healthy weight. Get regular exercise Get regular exercise. This is one of the most important things you can do for your health. Most adults should:  Exercise for at least 150 minutes each week. The exercise should increase your heart rate and make you sweat (moderate-intensity exercise).  Do strengthening exercises at least twice a week. This is in addition to the moderate-intensity exercise.  Spend less time sitting. Even light physical activity can be beneficial. Watch cholesterol and blood lipids Have your blood tested for lipids and cholesterol at 83 years of age, then have this test every 5 years. Have your cholesterol levels checked more often if:  Your lipid or cholesterol levels are high.  You are older than 83 years of age.  You are at high risk for heart disease. What should I know about cancer screening? Depending on your health history and family history, you may need to have cancer screening at various ages. This may include screening for:  Breast cancer.  Cervical cancer.  Colorectal cancer.  Skin cancer.  Lung cancer. What should I know about heart disease, diabetes, and high blood  pressure? Blood pressure and heart disease  High blood pressure causes heart disease and increases the risk of stroke. This is more likely to develop in people who have high blood pressure readings, are of African descent, or are overweight.  Have your blood pressure checked: ? Every 3-5 years if you are 18-39 years of age. ? Every year if you are 40 years old or older. Diabetes Have regular diabetes screenings. This checks your fasting blood sugar level. Have the screening done:  Once every three years after age 40 if you are at a normal weight and have a low risk for diabetes.  More often and at a younger age if you are overweight or have a high risk for diabetes. What should I know about preventing infection? Hepatitis B If you have a higher risk for hepatitis B, you should be screened for this virus. Talk with your health care provider to find out if you are at risk for hepatitis B infection. Hepatitis C Testing is recommended for:  Everyone born from 1945 through 1965.  Anyone with known risk factors for hepatitis C. Sexually transmitted infections (STIs)  Get screened for STIs, including gonorrhea and chlamydia, if: ? You are sexually active and are younger than 83 years of age. ? You are older than 83 years of age and your health care provider tells you that you are at risk for this type of infection. ? Your sexual activity has changed since you were last screened, and you are at increased risk for chlamydia or gonorrhea. Ask your health care provider if   you are at risk.  Ask your health care provider about whether you are at high risk for HIV. Your health care provider may recommend a prescription medicine to help prevent HIV infection. If you choose to take medicine to prevent HIV, you should first get tested for HIV. You should then be tested every 3 months for as long as you are taking the medicine. Pregnancy  If you are about to stop having your period (premenopausal) and  you may become pregnant, seek counseling before you get pregnant.  Take 400 to 800 micrograms (mcg) of folic acid every day if you become pregnant.  Ask for birth control (contraception) if you want to prevent pregnancy. Osteoporosis and menopause Osteoporosis is a disease in which the bones lose minerals and strength with aging. This can result in bone fractures. If you are 65 years old or older, or if you are at risk for osteoporosis and fractures, ask your health care provider if you should:  Be screened for bone loss.  Take a calcium or vitamin D supplement to lower your risk of fractures.  Be given hormone replacement therapy (HRT) to treat symptoms of menopause. Follow these instructions at home: Lifestyle  Do not use any products that contain nicotine or tobacco, such as cigarettes, e-cigarettes, and chewing tobacco. If you need help quitting, ask your health care provider.  Do not use street drugs.  Do not share needles.  Ask your health care provider for help if you need support or information about quitting drugs. Alcohol use  Do not drink alcohol if: ? Your health care provider tells you not to drink. ? You are pregnant, may be pregnant, or are planning to become pregnant.  If you drink alcohol: ? Limit how much you use to 0-1 drink a day. ? Limit intake if you are breastfeeding.  Be aware of how much alcohol is in your drink. In the [place] one drink equals one 12 oz bottle of beer (355 mL), one 5 oz glass of wine (148 mL), or one 1 oz glass of hard liquor (44 mL). General instructions  Schedule regular health, dental, and eye exams.  Stay current with your vaccines.  Tell your health care provider if: ? You often feel depressed. ? You have ever been abused or do not feel safe at home. Summary  Adopting a healthy lifestyle and getting preventive care are important in promoting health and wellness.  Follow your health care provider's instructions about healthy  diet, exercising, and getting tested or screened for diseases.  Follow your health care provider's instructions on monitoring your cholesterol and blood pressure. This information is not intended to replace advice given to you by your health care provider. Make sure you discuss any questions you have with your health care provider. Document Released: 06/14/2011 Document Revised: 11/22/2018 Document Reviewed: 11/22/2018 Elsevier Patient Education  2020 [employer]  

## 2019-07-06 NOTE — Progress Notes (Signed)
Subjective:     Patient ID: Tricia Ferguson , female    DOB: [birth year] , 85 y.o.   MRN: [id number]   Chief Complaint  Patient presents with  . Annual Exam  . Hypertension    HPI  She is here today for a full physical examination. She is no longer seen by GYN.   Hypertension This is a chronic problem. The current episode started more than 1 year ago. The problem has been gradually improving since onset. The problem is controlled. Associated symptoms include palpitations. Pertinent negatives include no blurred vision, chest pain or shortness of breath. Risk factors for coronary artery disease include obesity, post-menopausal state and sedentary lifestyle. Past treatments include ACE inhibitors. The current treatment provides moderate improvement. Compliance problems include exercise.      Past Medical History:  Diagnosis Date  . Chronic kidney disease   . Hypertension   . Peripheral vascular disease ([place])   . Vitamin D deficiency disease      Family History  Problem Relation Age of Onset  . Hypertension Mother   . Throat cancer Mother   . Prostate cancer Father   . Tremor Father      Current Outpatient Medications:  .  Ascorbic Acid (VITAMIN C PO), Take by mouth., Disp: , Rfl:  .  calcium carbonate (TUMS - DOSED IN MG ELEMENTAL CALCIUM) 500 MG chewable tablet, Chew 2 tablets by mouth daily. , Disp: , Rfl:  .  Cholecalciferol (VITAMIN D3 PO), Take by mouth., Disp: , Rfl:  .  Famotidine-Ca Carb-Mag Hydrox (PEPCID COMPLETE PO), Take 1 tablet by mouth., Disp: , Rfl:  .  fish oil-omega-3 fatty acids 1000 MG capsule, Take 2 g by mouth daily., Disp: , Rfl:  .  Garlic 9509 MG CAPS, Take by mouth., Disp: , Rfl:  .  glucosamine-chondroitin 500-400 MG tablet, Take 1 tablet by mouth 3 (three) times daily., Disp: , Rfl:  .  lisinopril (ZESTRIL) 5 MG tablet, TAKE 1 TABLET BY MOUTH DAILY, Disp: 90 tablet, Rfl: 0 .  loratadine (CLARITIN) 10 MG tablet, Take 10 mg by mouth daily., Disp: ,  Rfl:  .  Multiple Vitamin (MULTIVITAMIN) tablet, Take 1 tablet by mouth daily., Disp: , Rfl:  .  Multiple Vitamins-Minerals (PRESERVISION AREDS PO), Take by mouth., Disp: , Rfl:  .  naproxen (NAPROSYN) 375 MG tablet, Take 1 tablet (375 mg total) by mouth 2 (two) times daily. (Patient not taking: Reported on 06/27/2019), Disp: 20 tablet, Rfl: 0   Allergies  Allergen Reactions  . Amoxicillin Itching  . Penicillins Hives  . Potassium-Containing Compounds Itching     Review of Systems  Constitutional: Negative.   HENT: Negative.   Eyes: Negative.  Negative for blurred vision.  Respiratory: Negative.  Negative for shortness of breath.   Cardiovascular: Positive for palpitations. Negative for chest pain.       She c/o heart beating in her throat. There is no associated sob/cp.  She is unable to state what triggers her symptoms.    Endocrine: Negative.   Genitourinary: Negative.   Musculoskeletal: Negative.   Skin: Negative.   Allergic/Immunologic: Negative.   Neurological: Negative.   Hematological: Negative.   Psychiatric/Behavioral: Negative.      Today's Vitals   07/06/19 1200  BP: 116/72  Pulse: 74  Temp: 98.6 F (37 C)  TempSrc: Oral  Weight: 204 lb 6.4 oz (92.7 kg)  Height: 5' 0.4" (1.534 m)   Body mass index is 39.39 kg/m.   Objective:  Physical Exam Vitals signs and nursing note reviewed.  Constitutional:      Appearance: Normal appearance. She is obese.  HENT:     Head: Normocephalic and atraumatic.     Right Ear: Tympanic membrane, ear canal and external ear normal.     Left Ear: Tympanic membrane, ear canal and external ear normal.     Nose: Nose normal.     Mouth/Throat:     Mouth: Mucous membranes are moist.     Pharynx: Oropharynx is clear.  Eyes:     Extraocular Movements: Extraocular movements intact.     Conjunctiva/sclera: Conjunctivae normal.     Pupils: Pupils are equal, round, and reactive to light.  Neck:     Musculoskeletal: Normal range of  motion and neck supple.  Cardiovascular:     Rate and Rhythm: Normal rate and regular rhythm.     Pulses: Normal pulses.     Heart sounds: Normal heart sounds.  Pulmonary:     Effort: Pulmonary effort is normal.     Breath sounds: Normal breath sounds.  Chest:     Breasts: Tanner Score is 5.        Right: Normal. No swelling, bleeding, inverted nipple, mass, nipple discharge or skin change.        Left: Normal. No swelling, bleeding, inverted nipple, mass, nipple discharge or skin change.  Abdominal:     General: Bowel sounds are normal.     Palpations: Abdomen is soft.     Comments: 0bese  Genitourinary:    Comments: deferred Musculoskeletal: Normal range of motion.     Comments: Some pain with flexion of b/l knees  Skin:    General: Skin is warm and dry.  Neurological:     General: No focal deficit present.     Mental Status: She is alert and oriented to person, place, and time.  Psychiatric:        Mood and Affect: Mood normal.        Behavior: Behavior normal.         Assessment And Plan:     1. Routine general medical examination at health care facility  A full exam was performed. Importance of monthly self breast exams was discussed with the patient. PATIENT HAS BEEN ADVISED TO GET 30-45 MINUTES REGULAR EXERCISE NO LESS THAN FOUR TO FIVE DAYS PER WEEK - BOTH WEIGHTBEARING EXERCISES AND AEROBIC ARE RECOMMENDED.  SHE IS ADVISED TO FOLLOW A HEALTHY DIET WITH AT LEAST SIX FRUITS/VEGGIES PER DAY, DECREASE INTAKE OF RED MEAT, AND TO INCREASE FISH INTAKE TO TWO DAYS PER WEEK.  MEATS/FISH SHOULD NOT BE FRIED, BAKED OR BROILED IS PREFERABLE.  I SUGGEST WEARING SPF 50 SUNSCREEN ON EXPOSED PARTS AND ESPECIALLY WHEN IN THE DIRECT SUNLIGHT FOR AN EXTENDED PERIOD OF TIME.  PLEASE AVOID FAST FOOD RESTAURANTS AND INCREASE YOUR WATER INTAKE.   2. Essential hypertension, benign  Well controlled. She will continue with current meds. She is encouraged to avoid adding salt to her foods. EKG  performed, no new changes noted. She will rto in six months for re-evaluation.  - EKG 12-Lead - CMP14+EGFR - CBC - Lipid panel  3. Palpitations  Recurrent.  I will check electrolytes. If normal, she agrees to Cardiology evaluation.   - CMP14+EGFR - Magnesium  4. Snoring  She agrees to Neurology for further evaluation/sleep study evaluation.   - Ambulatory referral to Neurology  5. Class 2 severe obesity due to excess calories with serious comorbidity and body mass  index (BMI) of 39.0 to 39.9 in adult [facility])  Importance of achieving optimal weight to decrease risk of cardiovascular disease and cancers was discussed with the patient in full detail. She is encouraged to start slowly - start with 10 minutes twice daily at least three to four days per week and to gradually build to 30 minutes five days weekly. She was given tips to incorporate more activity into her daily routine - take stairs when possible, park farther away from grocery stores, etc.    [name], MD    THE PATIENT IS ENCOURAGED TO PRACTICE SOCIAL DISTANCING DUE TO THE COVID-19 PANDEMIC.

## 2019-07-07 LAB — CMP14+EGFR
ALT: 18 IU/L (ref 0–32)
AST: 20 IU/L (ref 0–40)
Albumin/Globulin Ratio: 1.7 (ref 1.2–2.2)
Albumin: 4.3 g/dL (ref 3.6–4.6)
Alkaline Phosphatase: 82 IU/L (ref 39–117)
BUN/Creatinine Ratio: 26 (ref 12–28)
BUN: 26 mg/dL (ref 8–27)
Bilirubin Total: 0.3 mg/dL (ref 0.0–1.2)
CO2: 26 mmol/L (ref 20–29)
Calcium: 9.9 mg/dL (ref 8.7–10.3)
Chloride: 101 mmol/L (ref 96–106)
Creatinine, Ser: 1 mg/dL (ref 0.57–1.00)
GFR calc Af Amer: 60 mL/min/{1.73_m2} (ref >59)
GFR calc non Af Amer: 52 mL/min/{1.73_m2} — ABNORMAL LOW (ref >59)
Globulin, Total: 2.6 g/dL (ref 1.5–4.5)
Glucose: 67 mg/dL (ref 65–99)
Potassium: 4.6 mmol/L (ref 3.5–5.2)
Sodium: 139 mmol/L (ref 134–144)
Total Protein: 6.9 g/dL (ref 6.0–8.5)

## 2019-07-07 LAB — LIPID PANEL
Chol/HDL Ratio: 2.2 ratio (ref 0.0–4.4)
Cholesterol, Total: 147 mg/dL (ref 100–199)
HDL: 66 mg/dL (ref >39)
LDL Calculated: 64 mg/dL (ref 0–99)
Triglycerides: 85 mg/dL (ref 0–149)
VLDL Cholesterol Cal: 17 mg/dL (ref 5–40)

## 2019-07-07 LAB — CBC
Hematocrit: 35.9 % (ref 34.0–46.6)
Hemoglobin: 11.5 g/dL (ref 11.1–15.9)
MCH: 30.7 pg (ref 26.6–33.0)
MCHC: 32 g/dL (ref 31.5–35.7)
MCV: 96 fL (ref 79–97)
Platelets: 224 10*3/uL (ref 150–450)
RBC: 3.75 x10E6/uL — ABNORMAL LOW (ref 3.77–5.28)
RDW: 12 % (ref 11.7–15.4)
WBC: 9.3 10*3/uL (ref 3.4–10.8)

## 2019-07-07 LAB — MAGNESIUM: Magnesium: 2.1 mg/dL (ref 1.6–2.3)

## 2019-07-11 ENCOUNTER — Encounter: Payer: Medicare Other | Admitting: Internal Medicine

## 2019-07-11 ENCOUNTER — Ambulatory Visit: Payer: Medicare Other | Admitting: Internal Medicine

## 2019-07-11 ENCOUNTER — Ambulatory Visit: Payer: Medicare Other

## 2019-07-11 ENCOUNTER — Other Ambulatory Visit: Payer: Self-pay

## 2019-07-11 DIAGNOSIS — R002 Palpitations: Secondary | ICD-10-CM

## 2019-07-13 ENCOUNTER — Encounter: Payer: Medicare Other | Admitting: Internal Medicine

## 2019-07-13 ENCOUNTER — Other Ambulatory Visit: Payer: Medicare Other

## 2019-07-14 ENCOUNTER — Other Ambulatory Visit: Payer: Self-pay

## 2019-07-14 DIAGNOSIS — Z20822 Contact with and (suspected) exposure to covid-19: Secondary | ICD-10-CM

## 2019-07-15 LAB — NOVEL CORONAVIRUS, NAA: SARS-CoV-2, NAA: NOT DETECTED

## 2019-07-17 ENCOUNTER — Encounter: Payer: Self-pay | Admitting: Neurology

## 2019-07-17 ENCOUNTER — Ambulatory Visit (INDEPENDENT_AMBULATORY_CARE_PROVIDER_SITE_OTHER): Payer: Medicare Other | Admitting: Neurology

## 2019-07-17 ENCOUNTER — Other Ambulatory Visit: Payer: Self-pay

## 2019-07-17 VITALS — BP 198/91 | HR 74 | Ht 60.0 in | Wt 207.0 lb

## 2019-07-17 DIAGNOSIS — G479 Sleep disorder, unspecified: Secondary | ICD-10-CM

## 2019-07-17 DIAGNOSIS — G4719 Other hypersomnia: Secondary | ICD-10-CM

## 2019-07-17 DIAGNOSIS — R0683 Snoring: Secondary | ICD-10-CM

## 2019-07-17 DIAGNOSIS — R351 Nocturia: Secondary | ICD-10-CM | POA: Diagnosis not present

## 2019-07-17 DIAGNOSIS — G250 Essential tremor: Secondary | ICD-10-CM

## 2019-07-17 DIAGNOSIS — R498 Other voice and resonance disorders: Secondary | ICD-10-CM

## 2019-07-17 NOTE — Patient Instructions (Signed)
Thank you for choosing [facility] for your sleep related care! It was nice to meet you today! I appreciate that you entrust me with your sleep related healthcare concerns. I hope, I was able to address at least some of your concerns today, and that I can help you feel reassured and also get better.    Here is what we discussed today and what we came up with as our plan for you:    Based on your symptoms and your exam I believe you are at risk for obstructive sleep apnea (aka OSA), and I think we should proceed with a sleep study to determine whether you do or do not have OSA and how severe it is. Even, if you have mild OSA, I may want you to consider treatment with CPAP, as treatment of even borderline or mild sleep apnea can result and improvement of symptoms such as sleep disruption, daytime sleepiness, nighttime bathroom breaks, restless leg symptoms, improvement of headache syndromes, even improved mood disorder.   I understand that he would like to think about it and talk to your daughter first. You can call us back anytime if you are ready to proceed with sleep study testing.  I would be happy to order the sleep study for you.  Please remember, the long-term risks and ramifications of untreated moderate to severe obstructive sleep apnea are: increased Cardiovascular disease, including congestive heart failure, stroke, difficult to control hypertension, treatment resistant obesity, arrhythmias, especially irregular heartbeat commonly known as A. Fib. (atrial fibrillation); even type 2 diabetes has been linked to untreated OSA.   Sleep apnea can cause disruption of sleep and sleep deprivation in most cases, which, in turn, can cause recurrent headaches, problems with memory, mood, concentration, focus, and vigilance. Most people with untreated sleep apnea report excessive daytime sleepiness, which can affect their ability to drive. Please do not drive if you feel sleepy. Patients  with sleep apnea developed difficulty initiating and maintaining sleep (aka insomnia).   Having sleep apnea may increase your risk for other sleep disorders, including involuntary behaviors sleep such as sleep terrors, sleep talking, sleepwalking.    Having sleep apnea can also increase your risk for restless leg syndrome and leg movements at night.   Please note that untreated obstructive sleep apnea may carry additional perioperative morbidity. Patients with significant obstructive sleep apnea (typically, in the moderate to severe degree) should receive, if possible, perioperative PAP (positive airway pressure) therapy and the surgeons and particularly the anesthesiologists should be informed of the diagnosis and the severity of the sleep disordered breathing.   I will likely see you back after your sleep study to go over the test results and where to go from there. We will call you after your sleep study to advise about the results (most likely, you will hear from Paac Ciinak, my nurse) and to set up an appointment at the time, as necessary.    Our sleep lab administrative assistant will call you to schedule your sleep study and give you further instructions, regarding the check in process for the sleep study, arrival time, what to bring, when you can expect to leave after the study, etc., and to answer any other logistical questions you may have. If you don't hear back from her by about 2 weeks from now, please feel free to call her direct line at [phone] or you can call our general clinic number, or email [place] through My Chart.

## 2019-07-17 NOTE — Progress Notes (Signed)
Subjective:    Patient ID: EMMERIE BATTAGLIA is a 85 y.o. female.  HPI     Star Age, MD, PhD [facility] [address], [address] [address] [place], [place] [address]  Dear Dr. Baird Cancer,   I saw your patient, Aritha Huckeba, upon your kind request in my sleep clinic today for initial consultation of her sleep disorder, in particular, concern for underlying obstructive sleep apnea.  The patient is unaccompanied today.  As you know, Ms. Tuite is an 85 year old right-handed woman with an underlying medical history of peripheral vascular disease, hypertension, chronic kidney disease, arthritis, obesity, vitamin D deficiency and essential tremor, particularly head tremor with a family history of tremor, who reports snoring and excessive daytime somnolence.  I first met her on 02/27/2019 for evaluation of her head tremor.  She had a rather mild appearing tremor but history and examination in keeping with essential tremor.  We mutually agreed to continue to monitor her symptoms and examination.  She did report not sleeping very well at the time.  Her husband used to tell her that she snores.  She lives alone.  She has 3 grown daughters.  She reports that her tremor has been fairly stable.  Her Epworth sleepiness score is 11 out of 24, fatigue severity score is 32 out of 63. She goes to bed between 930 and 10, some nights she sleeps well and some nights she has a hard time going to sleep.  Melatonin has helped a little bit, and sometimes she takes 2 Tylenol at bedtime which also helps a little bit.  She has a rise time of around 4 but sometimes he may wake up around 1 AM and would not go back to sleep.  She has significant nocturia, is typically up 4 or 5 times to go to the bathroom.  She has had rare morning headaches.  She would like to think about sleep study testing.   Previously:   02/27/2019: (She) reports a several month history of head tremor. I reviewed your office  note from 01/04/2019.symptoms have been progressive, she endorses a family history of tremor in her father who had a head tremor, and youngest sister has a head tremor. Patient is the second oldest of a total of 7 children, she had 3 brothers, 2 passed away and has 3 sisters alive. 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 Other than that she is not particularly bothered by the tremor O notices it the tremor. She had successful cataract surgeries recently, right side was done in August 2019, left side was done in September 2019. She has not required any prescription eyeglasses, likes to use eyeglasses nevertheless with no strength, stating that she is so used to using eyeglasses. She also has readers if needed. She has been referred to physical therapy and they reached out to her but she has not called him back for scheduling an appointment for her balance issue. She had a recent fall. She has right knee problems and recently received an injection into the right knee less than a week ago and is status post right knee arthroscopic surgery in 2005. She has a cane but does not typically use it. She has not had much in the way of hand tremor, with the exception of a very slight intermittent hand tremor which is not very bothersome to her. She is widowed and lives alone, she has 3  children. She quit smoking in 2005, does not utilize alcohol and drinks caffeine occasionally, not daily.  She reduced her caffeine intake years ago because of a remote history of palpitations. She has noted a voice tremor. Of note, she does not sleep very well. It is difficult for her to go to sleep and stay asleep. She has significant nocturia up to 5 times per average night, does not know if she snores however. She has been reluctant to try melatonin which was recommended by you, per pt.  She had recent blood work through your office which I reviewed including  TSH.  Her Past Medical History Is Significant For: Past Medical History:  Diagnosis Date  . Chronic kidney disease   . Hypertension   . Peripheral vascular disease ([place])   . Vitamin D deficiency disease     Her Past Surgical History Is Significant For: Past Surgical History:  Procedure Laterality Date  . ABDOMINAL HYSTERECTOMY    . HEMORRHOID SURGERY      Her Family History Is Significant For: Family History  Problem Relation Age of Onset  . Hypertension Mother   . Throat cancer Mother   . Prostate cancer Father   . Tremor Father     Her Social History Is Significant For: Social History   Socioeconomic History  . Marital status: Widowed    Spouse name: Not on file  . Number of children: Not on file  . Years of education: Not on file  . Highest education level: Not on file  Occupational History  . Occupation: retired  Scientific laboratory technician  . Financial resource strain: Not hard at all  . Food insecurity    Worry: Never true    Inability: Never true  . Transportation needs    Medical: No    Non-medical: No  Tobacco Use  . Smoking status: Former Smoker    Packs/day: 1.00    Years: 20.00    Pack years: 20.00    Types: Cigarettes    Quit date: 2004    Years since quitting: 16.6  . Smokeless tobacco: Never Used  Substance and Sexual Activity  . Alcohol use: No  . Drug use: No  . Sexual activity: Not Currently  Lifestyle  . Physical activity    Days per week: 7 days    Minutes per session: 30 min  . Stress: Not at all  Relationships  . Social Herbalist on phone: Not on file    Gets together: Not on file    Attends religious service: Not on file    Active member of club or organization: Not on file    Attends meetings of clubs or organizations: Not on file    Relationship status: Not on file  Other Topics Concern  . Not on file  Social History Narrative  . Not on file    Her Allergies Are:  Allergies  Allergen Reactions  . Amoxicillin Itching   . Penicillins Hives  . Potassium-Containing Compounds Itching  :   Her Current Medications Are:  Outpatient Encounter Medications as of 07/17/2019  Medication Sig  . Ascorbic Acid (VITAMIN C PO) Take by mouth.  . calcium carbonate (TUMS - DOSED IN MG ELEMENTAL CALCIUM) 500 MG chewable tablet Chew 2 tablets by mouth daily.   . Cholecalciferol (VITAMIN D3 PO) Take by mouth.  . Famotidine-Ca Carb-Mag Hydrox (PEPCID COMPLETE PO) Take 1 tablet by mouth.  . fish oil-omega-3 fatty acids 1000 MG capsule Take 2 g  by mouth daily.  . Garlic 0076 MG CAPS Take by mouth.  Marland Kitchen glucosamine-chondroitin 500-400 MG tablet Take 1 tablet by mouth 3 (three) times daily.  Marland Kitchen lisinopril (ZESTRIL) 5 MG tablet TAKE 1 TABLET BY MOUTH DAILY  . loratadine (CLARITIN) 10 MG tablet Take 10 mg by mouth daily.  . Multiple Vitamin (MULTIVITAMIN) tablet Take 1 tablet by mouth daily.  . Multiple Vitamins-Minerals (PRESERVISION AREDS PO) Take by mouth.  . naproxen (NAPROSYN) 375 MG tablet Take 1 tablet (375 mg total) by mouth 2 (two) times daily.   No facility-administered encounter medications on file as of 07/17/2019.   :  Review of Systems:  Out of a complete 14 point review of systems, all are reviewed and negative with the exception of these symptoms as listed below: Review of Systems  Neurological:       Pt presents today to discuss her sleep. Pt has never had a sleep study and is unsure if she snores.  Epworth Sleepiness Scale 0= would never doze 1= slight chance of dozing 2= moderate chance of dozing 3= high chance of dozing  Sitting and reading: 2 Watching TV: 2 Sitting inactive in a public place (ex. Theater or meeting): 1 As a passenger in a car for an hour without a break: 1 Lying down to rest in the afternoon: 3 Sitting and talking to someone: 0 Sitting quietly after lunch (no alcohol): 2 In a car, while stopped in traffic: 0 Total: 11     Objective:  Neurological Exam  Physical Exam Physical  Examination:   Vitals:   07/17/19 1553  BP: (!) 198/91  Pulse: 74    General Examination: The patient is a very pleasant 85 y.o. female in no acute distress. She appears well-developed and well-nourished and well groomed.   HEENT: Normocephalic, atraumatic, pupils are equal, round and reactive to light and accommodation. Extraocular tracking is good without limitation to gaze excursion or nystagmus noted. Normal smooth pursuit is noted. Hearing is grossly intact. Face is symmetric with normal facial animation and normal facial sensation. Speech is clear with no dysarthria noted. There is no hypophonia. There is a mild side-to-side head tremor and a mild to moderate voice tremor. Neck is supple, no nuchal rigidity noted. There are no carotid bruits on auscultation. Oropharynx exam reveals: mild mouth dryness, adequate dental hygiene and moderate airway crowding, due to redundant soft palate, thicker soft palate and ? Residual tonsilar tissue on the L, She reports having had a tonsillectomy as an adult.  Tongue protrudes centrally in palate elevates symmetrically.  Neck circumference is 15-5/8 inches.   Chest: Clear to auscultation without wheezing, rhonchi or crackles noted.  Heart: S1+S2+0, regular and normal without murmurs, rubs or gallops noted.   Abdomen: Soft, non-tender and non-distended with normal bowel sounds appreciated on auscultation.  Extremities: There is 1+ pitting edema in the distal lower extremities bilaterally.  Skin: Warm and dry without trophic changes noted.  Musculoskeletal: exam reveals no obvious joint deformities, tenderness or joint swelling or erythema.   Neurologically:  Mental status: The patient is awake, alert and oriented in all 4 spheres. Her immediate and remote memory, attention, language skills and fund of knowledge are appropriate. There is no evidence of aphasia, agnosia, apraxia or anomia. Speech is clear with normal prosody and enunciation.  Thought process is linear. Mood is normal and affect is normal.  Cranial nerves II - XII are as described above under HEENT exam. In addition: shoulder shrug is normal with  equal shoulder height noted. Motor exam: Normal bulk, strength and tone is noted. There is no drift, resting tremor or rebound.  She has no significant postural or action tremor in the hands. She has no resting tremor in the lower extremities or upper extremities.  Reflexes are 1+ in the upper extremities and decreased in the lower extremities, fine motor skills and coordination: intact with normal finger taps, normal hand movements, normal rapid alternating patting, normal foot taps and normal foot agility.  Cerebellar testing: No dysmetria or intention tremor. No truncal or gait ataxia.  Sensory exam: intact to light touch in the upper and lower extremities.  Gait, station and balance: She stands without significant difficulty, posture is stooped as insignificant forward tilt in the lumbar spine with loss of lumbar lordosis noted. She walks perhaps with a slight limp on the right, no shuffling, preserved arm swing noted, no walking aid.   Assessment and plan:   In summary, WREATHA STURGEON is a very pleasant 85 year old female with an underlying medical history of vitamin D deficiency, peripheral vascular disease, hypertension, chronic kidney disease, arthritis, obesity, And essential tremor, who presents for evaluation of her sleep disturbance.  Her history and examination are concerning for underlying obstructive sleep apnea.  We talked about the sleep study testing and the diagnosis of obstructive sleep apnea and treatment options quite a bit today.  She would like to think about coming in for sleep study.  She would like to discuss it with her daughter.  We talked about the importance of sleep for overall wellbeing.  She is encouraged to call us back if she decides to proceed with a sleep study. I suggested follow up as  needed. I answered all her questions today and she was in agreement. I spent 25 minutes in total face-to-face time with the patient, more than 50% of which was spent in counseling and coordination of care, reviewing test results, reviewing medication and discussing or reviewing the diagnosis of OSA, its prognosis and treatment options. Pertinent laboratory and imaging test results that were available during this visit with the patient were reviewed by me and considered in my medical decision making (see chart for details).

## 2019-07-23 ENCOUNTER — Other Ambulatory Visit: Payer: Medicare Other

## 2019-07-24 ENCOUNTER — Ambulatory Visit
Admission: RE | Admit: 2019-07-24 | Discharge: 2019-07-24 | Disposition: A | Discharge status: Home / Self Care | Payer: Medicare Other | Source: Ambulatory Visit | Attending: Internal Medicine | Admitting: Internal Medicine

## 2019-07-24 ENCOUNTER — Other Ambulatory Visit: Payer: Self-pay

## 2019-07-24 DIAGNOSIS — N6320 Unspecified lump in the left breast, unspecified quadrant: Secondary | ICD-10-CM

## 2019-07-24 DIAGNOSIS — Z780 Asymptomatic menopausal state: Secondary | ICD-10-CM | POA: Diagnosis not present

## 2019-07-24 DIAGNOSIS — N6321 Unspecified lump in the left breast, upper outer quadrant: Secondary | ICD-10-CM | POA: Diagnosis not present

## 2019-07-24 DIAGNOSIS — E2839 Other primary ovarian failure: Secondary | ICD-10-CM

## 2019-07-24 DIAGNOSIS — R922 Inconclusive mammogram: Secondary | ICD-10-CM | POA: Diagnosis not present

## 2019-07-24 DIAGNOSIS — Z13820 Encounter for screening for osteoporosis: Secondary | ICD-10-CM | POA: Diagnosis not present

## 2019-08-01 ENCOUNTER — Other Ambulatory Visit: Payer: Medicare Other

## 2019-08-01 ENCOUNTER — Ambulatory Visit: Payer: Self-pay | Admitting: Cardiology

## 2019-08-01 ENCOUNTER — Other Ambulatory Visit: Payer: Self-pay

## 2019-08-01 MED ORDER — LISINOPRIL 5 MG PO TABS: 5.0000 mg | ORAL_TABLET | Freq: Every day | ORAL | 1 refills | Status: DC

## 2019-08-06 DIAGNOSIS — H353221 Exudative age-related macular degeneration, left eye, with active choroidal neovascularization: Secondary | ICD-10-CM | POA: Diagnosis not present

## 2019-08-24 ENCOUNTER — Other Ambulatory Visit: Payer: Self-pay

## 2019-08-24 ENCOUNTER — Ambulatory Visit: Payer: Medicare Other | Admitting: Cardiology

## 2019-08-24 ENCOUNTER — Encounter: Payer: Self-pay | Admitting: Cardiology

## 2019-08-24 ENCOUNTER — Ambulatory Visit: Payer: Medicare Other

## 2019-08-24 VITALS — BP 158/72 | HR 80 | Temp 96.6°F | Ht 60.0 in | Wt 204.0 lb

## 2019-08-24 DIAGNOSIS — I10 Essential (primary) hypertension: Secondary | ICD-10-CM | POA: Diagnosis not present

## 2019-08-24 DIAGNOSIS — R002 Palpitations: Secondary | ICD-10-CM | POA: Diagnosis not present

## 2019-08-24 DIAGNOSIS — K219 Gastro-esophageal reflux disease without esophagitis: Secondary | ICD-10-CM | POA: Diagnosis not present

## 2019-08-24 MED ORDER — PANTOPRAZOLE SODIUM 40 MG PO TBEC: 40.0000 mg | DELAYED_RELEASE_TABLET | Freq: Every day | ORAL | 11 refills | Status: DC

## 2019-08-24 NOTE — Progress Notes (Signed)
Primary Physician:  Glendale Chard, MD   Patient ID: Tricia Ferguson, female    DOB: 10-26-1934, 85 y.o.   MRN: [id number]  Subjective:    Chief Complaint  Patient presents with  . Palpitations  . Hypertension  . New Patient (Initial Visit)    HPI: Tricia Ferguson  is a 85 y.o. female  with hypertension, GERD, referred to [place] by Dr. Baird Cancer for evaluation of palpitations.  She reports long history of palpitations, but states that these have worsened over the last few months. She does mention that her palpitations worsened with changes to acid reflux medications. Coffee and certain foods seem to exacerbate them. She notes them before or after meal. Palpitations can last for a few hours intermittently. Describes as flip-flopping sensation in her throat. No associated dizziness, heart racing, shortness of breath, or chest pain.   She reports that she is fairly active; however, was previously more active before COVID. She tolerates this well. She denies any exertional chest pain. She has noticed some shortness of breath lately with walking to the end of her driveway, but attributes this to recent weight gain. States that she has gained 5-6 lbs over the last few months.   She denies any history of diabetes or hyperlipidemia. States that she previously smoked, but quit 10-15 years ago. She has previously been evaluated by Vascular surgery for PAD and not felt to have any significant PAD by ABI.   Past Medical History:  Diagnosis Date  . Chronic kidney disease   . Hypertension   . Peripheral vascular disease ([place])   . Vitamin D deficiency disease     Past Surgical History:  Procedure Laterality Date  . ABDOMINAL HYSTERECTOMY    . HEMORRHOID SURGERY      Social History   Socioeconomic History  . Marital status: Widowed    Spouse name: Not on file  . Number of children: 3  . Years of education: Not on file  . Highest education level: Not on file  Occupational  History  . Occupation: retired  Scientific laboratory technician  . Financial resource strain: Not hard at all  . Food insecurity    Worry: Never true    Inability: Never true  . Transportation needs    Medical: No    Non-medical: No  Tobacco Use  . Smoking status: Former Smoker    Packs/day: 1.00    Years: 20.00    Pack years: 20.00    Types: Cigarettes    Quit date: 2004    Years since quitting: 16.7  . Smokeless tobacco: Never Used  Substance and Sexual Activity  . Alcohol use: No  . Drug use: No  . Sexual activity: Not Currently  Lifestyle  . Physical activity    Days per week: 7 days    Minutes per session: 30 min  . Stress: Not at all  Relationships  . Social Herbalist on phone: Not on file    Gets together: Not on file    Attends religious service: Not on file    Active member of club or organization: Not on file    Attends meetings of clubs or organizations: Not on file    Relationship status: Not on file  . Intimate partner violence    Fear of current or ex partner: No    Emotionally abused: No    Physically abused: No    Forced sexual activity: No  Other Topics Concern  . Not  on file  Social History Narrative  . Not on file    Review of Systems  Constitution: Negative for decreased appetite, malaise/fatigue, weight gain and weight loss.  Eyes: Negative for visual disturbance.  Cardiovascular: Negative for chest pain, claudication, dyspnea on exertion, leg swelling, orthopnea, palpitations and syncope.  Respiratory: Negative for hemoptysis and wheezing.   Endocrine: Negative for cold intolerance and heat intolerance.  Hematologic/Lymphatic: Does not bruise/bleed easily.  Skin: Negative for nail changes.  Musculoskeletal: Negative for muscle weakness and myalgias.  Gastrointestinal: Negative for abdominal pain, change in bowel habit, nausea and vomiting.  Neurological: Negative for difficulty with concentration, dizziness, focal weakness and headaches.   Psychiatric/Behavioral: Negative for altered mental status and suicidal ideas.  All other systems reviewed and are negative.     Objective:  Blood pressure (!) 158/72, pulse 80, temperature (!) 96.6 F (35.9 C), height 5' (1.524 m), weight 204 lb (92.5 kg), SpO2 94 %. Body mass index is 39.84 kg/m.    Physical Exam  Constitutional: She is oriented to person, place, and time. Vital signs are normal. She appears well-developed and well-nourished.  Morbidly obese  HENT:  Head: Normocephalic and atraumatic.  Neck: Normal range of motion.  Cardiovascular: Normal rate, regular rhythm, normal heart sounds and intact distal pulses.  Pulmonary/Chest: Effort normal and breath sounds normal. No accessory muscle usage. No respiratory distress.  Abdominal: Soft. Bowel sounds are normal.  Musculoskeletal: Normal range of motion.  Neurological: She is alert and oriented to person, place, and time.  Skin: Skin is warm and dry.  Vitals reviewed.  Radiology: No results found.  Laboratory examination:   CMP Latest Ref Rng & Units 07/06/2019 01/04/2019  Glucose 65 - 99 mg/dL 67 80  BUN 8 - 27 mg/dL 26 24  Creatinine 5.400.57 - 1.00 mg/dL 9.811.00 1.910.84  Sodium 478134 - 144 mmol/L 139 141  Potassium 3.5 - 5.2 mmol/L 4.6 4.2  Chloride 96 - 106 mmol/L 101 104  CO2 20 - 29 mmol/L 26 22  Calcium 8.7 - 10.3 mg/dL 9.9 9.3  Total Protein 6.0 - 8.5 g/dL 6.9 6.8  Total Bilirubin 0.0 - 1.2 mg/dL 0.3 0.4  Alkaline Phos 39 - 117 IU/L 82 78  AST 0 - 40 IU/L 20 24  ALT 0 - 32 IU/L 18 19   CBC Latest Ref Rng & Units 07/06/2019  WBC 3.4 - 10.8 x10E3/uL 9.3  Hemoglobin 11.1 - 15.9 g/dL 29.511.5  Hematocrit 62.134.0 - 46.6 % 35.9  Platelets 150 - 450 x10E3/uL 224   Lipid Panel     Component Value Date/Time   CHOL 147 07/06/2019 1252   TRIG 85 07/06/2019 1252   HDL 66 07/06/2019 1252   CHOLHDL 2.2 07/06/2019 1252   LDLCALC 64 07/06/2019 1252   HEMOGLOBIN A1C No results found for: HGBA1C, MPG TSH Recent Labs     01/04/19 1051  TSH 1.430    PRN Meds:. There are no discontinued medications. Current Meds  Medication Sig  . Ascorbic Acid (VITAMIN C PO) Take by mouth.  . calcium carbonate (TUMS - DOSED IN MG ELEMENTAL CALCIUM) 500 MG chewable tablet Chew 2 tablets by mouth daily.   . Cholecalciferol (VITAMIN D3 PO) Take by mouth.  . Famotidine-Ca Carb-Mag Hydrox (PEPCID COMPLETE PO) Take 1 tablet by mouth.  . fish oil-omega-3 fatty acids 1000 MG capsule Take 2 g by mouth daily.  . Garlic 1000 MG CAPS Take by mouth.  Marland Kitchen. glucosamine-chondroitin 500-400 MG tablet Take 1 tablet by mouth  3 (three) times daily.  Marland Kitchen lisinopril (ZESTRIL) 5 MG tablet Take 1 tablet (5 mg total) by mouth daily.  Marland Kitchen loratadine (CLARITIN) 10 MG tablet Take 10 mg by mouth daily.  . Multiple Vitamin (MULTIVITAMIN) tablet Take 1 tablet by mouth daily.  . Multiple Vitamins-Minerals (PRESERVISION AREDS PO) Take by mouth.    Cardiac Studies:     Assessment:   Palpitations - Plan: EKG 12-Lead  Essential hypertension  GERD without esophagitis  Morbid obesity (HCC)  EKG 08/24/2019: Normal sinus rhythm at 78 bpm, left axis deviation, left anterior fasicular block, LVH. No evidence of ischemia.   Recommendations:   Patient with hypertension, referred to [place] for evaluation of palpitations.  Patient has had a long history of palpitations, that have recently worsened.  Her symptoms of palpitations are suggestive of PACs/PVCs.  Etiology of this was explained.  I suspect patient has underlying acid reflux that is contributing to her symptoms as there appears to be a correlation between changes to her acid reflux medication and also timing of her symptoms around meals and indigestion.  We will place her on 2-week event monitor for further evaluation.  Given her hypertension, palpitations, and LVH on EKG, will obtain echocardiogram to exclude any structural abnormalities.  She is without any symptoms of angina, do not feel she needs stress  testing at this time.  I will start pantoprazole for management of acid reflux to see if she has any improvement in symptoms with this.  She may benefit from GI referral given her significant GERD. Blood pressure is generally well controlled, will reevaluate at her next office visit.   I am overall impressed with her that she appears much younger than her stated age.  Encouraged her to continue with primary prevention measures particularly with weight loss.  Discussed diet modifications and regular exercise to help with approximate 10 to 15 pound weight loss which I have recommended.  I will see her back after the test for further recommendations and reevaluation.   *I have discussed this case with Dr. Jacinto Halim and he personally examined the patient and participated in formulating the plan.*   Toniann Fail, MSN, APRN, FNP-C [facility] Office: [phone] Fax: [phone]

## 2019-08-30 ENCOUNTER — Ambulatory Visit: Payer: Medicare Other | Admitting: Neurology

## 2019-09-05 DIAGNOSIS — Z23 Encounter for immunization: Secondary | ICD-10-CM | POA: Diagnosis not present

## 2019-09-06 ENCOUNTER — Other Ambulatory Visit: Payer: Self-pay

## 2019-09-06 ENCOUNTER — Ambulatory Visit (INDEPENDENT_AMBULATORY_CARE_PROVIDER_SITE_OTHER): Payer: Medicare Other

## 2019-09-06 ENCOUNTER — Ambulatory Visit (INDEPENDENT_AMBULATORY_CARE_PROVIDER_SITE_OTHER): Payer: Medicare Other | Admitting: Internal Medicine

## 2019-09-06 ENCOUNTER — Encounter: Payer: Self-pay | Admitting: Internal Medicine

## 2019-09-06 VITALS — BP 122/68 | HR 75 | Temp 98.3°F | Ht 60.6 in | Wt 204.0 lb

## 2019-09-06 DIAGNOSIS — I10 Essential (primary) hypertension: Secondary | ICD-10-CM | POA: Diagnosis not present

## 2019-09-06 DIAGNOSIS — R002 Palpitations: Secondary | ICD-10-CM

## 2019-09-06 DIAGNOSIS — Z6839 Body mass index (BMI) 39.0-39.9, adult: Secondary | ICD-10-CM | POA: Diagnosis not present

## 2019-09-06 DIAGNOSIS — R350 Frequency of micturition: Secondary | ICD-10-CM | POA: Diagnosis not present

## 2019-09-06 DIAGNOSIS — I739 Peripheral vascular disease, unspecified: Secondary | ICD-10-CM | POA: Insufficient documentation

## 2019-09-06 LAB — POCT URINALYSIS DIPSTICK
Bilirubin, UA: NEGATIVE
Blood, UA: NEGATIVE
Glucose, UA: NEGATIVE
Ketones, UA: NEGATIVE
Nitrite, UA: NEGATIVE
Protein, UA: NEGATIVE
Spec Grav, UA: 1.01 (ref 1.010–1.025)
Urobilinogen, UA: 0.2 E.U./dL
pH, UA: 5.5 (ref 5.0–8.0)

## 2019-09-06 MED ORDER — PHENAZOPYRIDINE HCL 100 MG PO TABS: 100.0000 mg | ORAL_TABLET | Freq: Three times a day (TID) | ORAL | 0 refills | Status: DC | PRN

## 2019-09-06 NOTE — Progress Notes (Signed)
Subjective:     Patient ID: Tricia Ferguson , female    DOB: [birth year] , 85 y.o.   MRN: [id number]   Chief Complaint  Patient presents with  . Abdominal Pain    HPI  She is here today for evaluation of lower abdominal pain.  There is associated urinary frequency. It started about 2 weeks ago. Improve with lying down.   Abdominal Pain This is a new problem. The current episode started 1 to 4 weeks ago. The onset quality is gradual. The problem occurs 2 to 4 times per day. The pain is located in the suprapubic region. The pain is at a severity of 5/10. The pain is moderate. The quality of the pain is aching. Associated symptoms include frequency. Pertinent negatives include no constipation, diarrhea, nausea or vomiting. The pain is aggravated by movement. The pain is relieved by recumbency. There is no history of abdominal surgery or colon cancer.     Past Medical History:  Diagnosis Date  . Chronic kidney disease   . Hypertension   . Peripheral vascular disease (HCC)   . Vitamin D deficiency disease      Family History  Problem Relation Age of Onset  . Hypertension Mother   . Throat cancer Mother   . Prostate cancer Father   . Tremor Father   . Hypertension Sister   . Diabetes Sister   . Hypertension Brother   . Hypertension Sister   . Diabetes Sister   . Hypertension Sister      Current Outpatient Medications:  .  Ascorbic Acid (VITAMIN C PO), Take by mouth., Disp: , Rfl:  .  calcium carbonate (TUMS - DOSED IN MG ELEMENTAL CALCIUM) 500 MG chewable tablet, Chew 2 tablets by mouth daily. , Disp: , Rfl:  .  Cholecalciferol (VITAMIN D3 PO), Take by mouth., Disp: , Rfl:  .  fish oil-omega-3 fatty acids 1000 MG capsule, Take 2 g by mouth daily., Disp: , Rfl:  .  Garlic 1000 MG CAPS, Take by mouth., Disp: , Rfl:  .  glucosamine-chondroitin 500-400 MG tablet, Take 1 tablet by mouth 3 (three) times daily., Disp: , Rfl:  .  lisinopril (ZESTRIL) 5 MG tablet, Take 1  tablet (5 mg total) by mouth daily., Disp: 90 tablet, Rfl: 1 .  loratadine (CLARITIN) 10 MG tablet, Take 10 mg by mouth daily., Disp: , Rfl:  .  Multiple Vitamins-Minerals (PRESERVISION AREDS PO), Take by mouth., Disp: , Rfl:  .  naproxen (NAPROSYN) 375 MG tablet, Take 1 tablet (375 mg total) by mouth 2 (two) times daily., Disp: 20 tablet, Rfl: 0 .  pantoprazole (PROTONIX) 40 MG tablet, Take 1 tablet (40 mg total) by mouth daily. 30 minutes before breakfast., Disp: 30 tablet, Rfl: 11 .  Multiple Vitamin (MULTIVITAMIN) tablet, Take 1 tablet by mouth daily., Disp: , Rfl:    Allergies  Allergen Reactions  . Amoxicillin Itching  . Penicillins Hives  . Potassium-Containing Compounds Itching     Review of Systems  Constitutional: Negative.   Respiratory: Negative.   Cardiovascular: Negative.   Gastrointestinal: Positive for abdominal pain. Negative for constipation, diarrhea, nausea and vomiting.  Genitourinary: Positive for frequency.  Neurological: Negative.   Psychiatric/Behavioral: Negative.      Today's Vitals   09/06/19 0914  BP: 122/68  Pulse: 75  Temp: 98.3 F (36.8 C)  TempSrc: Oral  SpO2: 97%  Weight: 204 lb (92.5 kg)  Height: 5' 0.6" (1.539 m)   Body mass index is 39.06  kg/m.   Objective:  Physical Exam Vitals signs and nursing note reviewed.  Constitutional:      Appearance: Normal appearance.  HENT:     Head: Normocephalic and atraumatic.  Cardiovascular:     Rate and Rhythm: Normal rate and regular rhythm.     Heart sounds: Normal heart sounds.  Pulmonary:     Effort: Pulmonary effort is normal.     Breath sounds: Normal breath sounds.  Abdominal:     General: Bowel sounds are normal.     Palpations: Abdomen is soft.     Comments: Obese, difficult to assess organomegaly. There is no suprapubic tenderness.   Skin:    General: Skin is warm.  Neurological:     General: No focal deficit present.     Mental Status: She is alert.  Psychiatric:         Mood and Affect: Mood normal.        Behavior: Behavior normal.         Assessment And Plan:     1. Urinary frequency  I will check u/a along with urine culture. She was given rx pyridium 100mg  tid prn. I will treat with abx if indicated by urine culture.   - POCT Urinalysis Dipstick (81002) - Culture, Urine; Future - Culture, Urine  2. PAD (peripheral artery disease) (HCC)  Chronic. Importance of daily activity was discussed with the patient.  She does not wish to take rx chol meds.     3. Class 2 severe obesity due to excess calories with serious comorbidity and body mass index (BMI) of 39.0 to 39.9 in adult [facility])  She is encouraged to strive for BMI less than 32 to decrease cardiac risk. She is encouraged to increase daily activity, she is reminded that chair exercises count towards her exercise goal. She is also encouraged to limit her intake of refined carbs and processed foods.    [name], MD    THE PATIENT IS ENCOURAGED TO PRACTICE SOCIAL DISTANCING DUE TO THE COVID-19 PANDEMIC.

## 2019-09-07 LAB — URINE CULTURE

## 2019-09-10 ENCOUNTER — Encounter: Payer: Self-pay | Admitting: Internal Medicine

## 2019-09-17 DIAGNOSIS — H353221 Exudative age-related macular degeneration, left eye, with active choroidal neovascularization: Secondary | ICD-10-CM | POA: Diagnosis not present

## 2019-09-17 DIAGNOSIS — H35363 Drusen (degenerative) of macula, bilateral: Secondary | ICD-10-CM | POA: Diagnosis not present

## 2019-09-17 DIAGNOSIS — H353111 Nonexudative age-related macular degeneration, right eye, early dry stage: Secondary | ICD-10-CM | POA: Diagnosis not present

## 2019-09-17 NOTE — Progress Notes (Signed)
Normal heart function with normal LVEF at 74%. I will discuss findings at her upcoming appt. No changes compared to previous echo in 2014.

## 2019-09-17 NOTE — Progress Notes (Signed)
Called pt no answer left a vm to call back

## 2019-09-24 DIAGNOSIS — H353221 Exudative age-related macular degeneration, left eye, with active choroidal neovascularization: Secondary | ICD-10-CM | POA: Diagnosis not present

## 2019-09-25 NOTE — Progress Notes (Signed)
Called pt to inform her about her echo result, pt understood

## 2019-09-26 ENCOUNTER — Encounter: Payer: Self-pay | Admitting: Cardiology

## 2019-09-26 ENCOUNTER — Other Ambulatory Visit: Payer: Self-pay

## 2019-09-26 ENCOUNTER — Ambulatory Visit: Payer: Medicare Other | Admitting: Cardiology

## 2019-09-26 VITALS — BP 140/71 | HR 72 | Temp 97.7°F | Ht 60.0 in | Wt 204.8 lb

## 2019-09-26 DIAGNOSIS — R002 Palpitations: Secondary | ICD-10-CM | POA: Diagnosis not present

## 2019-09-26 DIAGNOSIS — K219 Gastro-esophageal reflux disease without esophagitis: Secondary | ICD-10-CM

## 2019-09-26 NOTE — Progress Notes (Signed)
Primary Physician:  [name], [name], MD   Patient ID: Tricia Ferguson More, female    DOB: 05/24/1934, 85 y.o.   MRN: [id number]  Subjective:    Chief Complaint  Patient presents with  . Palpitations  . Follow-up    Palps, Echo and monitor results    HPI: Tricia Ferguson  is a 85 y.o. female  with hypertension, GERD, recently evaluated by us for palpitations.   Patient has history of chronic palpitations that had recently worsened, but seemed to be exacerbated by certain foods or caffeine. Palpitations felt to be PVC's from GERD. She was started on Pantoprazole and underwent echocardiogram and event monitor. Now presents for follow up.   Symptoms have significantly improved since being on Pantoprazole. Palpitations are now rarely occurring. No new complaints.   She denies any history of diabetes or hyperlipidemia. States that she previously smoked, but quit 10-15 years ago. She has previously been evaluated by Vascular surgery for PAD and not felt to have any significant PAD by ABI.   Past Medical History:  Diagnosis Date  . Chronic kidney disease   . Hypertension   . Peripheral vascular disease (HCC)   . Vitamin D deficiency disease     Past Surgical History:  Procedure Laterality Date  . ABDOMINAL HYSTERECTOMY    . HEMORRHOID SURGERY      Social History   Socioeconomic History  . Marital status: Widowed    Spouse name: Not on file  . Number of children: 3  . Years of education: Not on file  . Highest education level: Not on file  Occupational History  . Occupation: retired  Engineer, productionocial Needs  . Financial resource strain: Not hard at all  . Food insecurity    Worry: Never true    Inability: Never true  . Transportation needs    Medical: No    Non-medical: No  Tobacco Use  . Smoking status: Former Smoker    Packs/day: 1.00    Years: 20.00    Pack years: 20.00    Types: Cigarettes    Quit date: 2004    Years since quitting: 16.7  . Smokeless  tobacco: Never Used  Substance and Sexual Activity  . Alcohol use: No  . Drug use: No  . Sexual activity: Not Currently  Lifestyle  . Physical activity    Days per week: 7 days    Minutes per session: 30 min  . Stress: Not at all  Relationships  . Social Musicianconnections    Talks on phone: Not on file    Gets together: Not on file    Attends religious service: Not on file    Active member of club or organization: Not on file    Attends meetings of clubs or organizations: Not on file    Relationship status: Not on file  . Intimate partner violence    Fear of current or ex partner: No    Emotionally abused: No    Physically abused: No    Forced sexual activity: No  Other Topics Concern  . Not on file  Social History Narrative  . Not on file    Review of Systems  Constitution: Negative for decreased appetite, malaise/fatigue, weight gain and weight loss.  Eyes: Negative for visual disturbance.  Cardiovascular: Negative for chest pain, claudication, dyspnea on exertion, leg swelling, orthopnea, palpitations and syncope.  Respiratory: Negative for hemoptysis and wheezing.   Endocrine: Negative for cold intolerance and heat intolerance.  Hematologic/Lymphatic: Does not  bruise/bleed easily.  Skin: Negative for nail changes.  Musculoskeletal: Negative for muscle weakness and myalgias.  Gastrointestinal: Negative for abdominal pain, change in bowel habit, nausea and vomiting.  Neurological: Negative for difficulty with concentration, dizziness, focal weakness and headaches.  Psychiatric/Behavioral: Negative for altered mental status and suicidal ideas.  All other systems reviewed and are negative.     Objective:  Blood pressure 140/71, pulse 72, temperature 97.7 F (36.5 C), height 5' (1.524 m), weight 204 lb 12.8 oz (92.9 kg), SpO2 99 %. Body mass index is 40 kg/m.    Physical Exam  Constitutional: She is oriented to person, place, and time. Vital signs are normal. She appears  well-developed and well-nourished.  Morbidly obese  HENT:  Head: Normocephalic and atraumatic.  Neck: Normal range of motion.  Cardiovascular: Normal rate, regular rhythm, normal heart sounds and intact distal pulses.  Pulmonary/Chest: Effort normal and breath sounds normal. No accessory muscle usage. No respiratory distress.  Abdominal: Soft. Bowel sounds are normal.  Musculoskeletal: Normal range of motion.  Neurological: She is alert and oriented to person, place, and time.  Skin: Skin is warm and dry.  Vitals reviewed.  Radiology: No results found.  Laboratory examination:   CMP Latest Ref Rng & Units 07/06/2019 01/04/2019  Glucose 65 - 99 mg/dL 67 80  BUN 8 - 27 mg/dL 26 24  Creatinine 0.57 - 1.00 mg/dL 1.00 0.84  Sodium 134 - 144 mmol/L 139 141  Potassium 3.5 - 5.2 mmol/L 4.6 4.2  Chloride 96 - 106 mmol/L 101 104  CO2 20 - 29 mmol/L 26 22  Calcium 8.7 - 10.3 mg/dL 9.9 9.3  Total Protein 6.0 - 8.5 g/dL 6.9 6.8  Total Bilirubin 0.0 - 1.2 mg/dL 0.3 0.4  Alkaline Phos 39 - 117 IU/L 82 78  AST 0 - 40 IU/L 20 24  ALT 0 - 32 IU/L 18 19   CBC Latest Ref Rng & Units 07/06/2019  WBC 3.4 - 10.8 x10E3/uL 9.3  Hemoglobin 11.1 - 15.9 g/dL 11.5  Hematocrit 34.0 - 46.6 % 35.9  Platelets 150 - 450 x10E3/uL 224   Lipid Panel     Component Value Date/Time   CHOL 147 07/06/2019 1252   TRIG 85 07/06/2019 1252   HDL 66 07/06/2019 1252   CHOLHDL 2.2 07/06/2019 1252   LDLCALC 64 07/06/2019 1252   HEMOGLOBIN A1C No results found for: HGBA1C, MPG TSH Recent Labs    01/04/19 1051  TSH 1.430    PRN Meds:. There are no discontinued medications. Current Meds  Medication Sig  . Ascorbic Acid (VITAMIN C PO) Take by mouth.  . calcium carbonate (TUMS - DOSED IN MG ELEMENTAL CALCIUM) 500 MG chewable tablet Chew 2 tablets by mouth daily.   . Cholecalciferol (VITAMIN D3 PO) Take by mouth.  . fish oil-omega-3 fatty acids 1000 MG capsule Take 2 g by mouth daily.  . Garlic 4496 MG CAPS  Take by mouth.  Marland Kitchen glucosamine-chondroitin 500-400 MG tablet Take 1 tablet by mouth 3 (three) times daily.  Marland Kitchen lisinopril (ZESTRIL) 5 MG tablet Take 1 tablet (5 mg total) by mouth daily.  Marland Kitchen loratadine (CLARITIN) 10 MG tablet Take 10 mg by mouth daily.  . Multiple Vitamin (MULTIVITAMIN) tablet Take 1 tablet by mouth daily.  . Multiple Vitamins-Minerals (PRESERVISION AREDS PO) Take by mouth.  . naproxen (NAPROSYN) 375 MG tablet Take 1 tablet (375 mg total) by mouth 2 (two) times daily.  . pantoprazole (PROTONIX) 40 MG tablet Take 1 tablet (40  mg total) by mouth daily. 30 minutes before breakfast.  . phenazopyridine (PYRIDIUM) 100 MG tablet Take 1 tablet (100 mg total) by mouth 3 (three) times daily as needed for pain (bladder pain).    Cardiac Studies:   2 week event monitor 09/11-09/24/20: Dominant rhythm showed normal sinus rhythm. Symptoms of flutter skipped beats correlated with NSR with occasional PVC's and occasional episodes of PVC's in trigeminal and quadgeminal pattern. No A fib, SVT, or high degree AV block.  Echocardiogram 09/06/2019:  Normal LV systolic function with EF 73%. Left ventricle cavity is normal in size. Mild concentric hypertrophy of the left ventricle. Normal global wall motion. Doppler evidence of grade I (impaired) diastolic dysfunction, normal LAP. Calculated EF 73%. Left atrial cavity is mildly dilated by volume. Trileaflet aortic valve. Trace aortic regurgitation. No significant change since 12/28/2012.  Assessment:   Palpitations  GERD without esophagitis  Morbid obesity (HCC)  EKG 08/24/2019: Normal sinus rhythm at 78 bpm, left axis deviation, left anterior fasicular block, LVH. No evidence of ischemia.   Recommendations:   I have discussed recently obtained 14 day event monitor that showed occasional PVC's. No A fib or SVT. Echocardiogram was also explained to the patient, no significant changes from 2014. Has mild LVH and grade 1 diastolic dysfunction.   I suspect that her palpitation worsening in frequency was related to GERD.  Since being on pantoprazole, her symptoms have significantly improved.  As her symptoms have improved, do not feel that she needs further evaluation.  Despite her advanced age, she is done remarkably well and is very active.  I have recommended that she work towards 10 to 15 pound weight loss to further decrease her risk factors.  As her symptoms have improved, I will see her back on a as needed basis.  I will ask Dr. Allyne Gee to take over prescribing her pantoprazole in the future.  She has pending appointment with her in January.  Encouraged her to contact me for any worsening symptoms, I will be glad to see her back at any time.   Toniann Fail, MSN, APRN, FNP-C [facility] [place] Office: [phone] Fax: [phone]

## 2019-10-29 DIAGNOSIS — H353221 Exudative age-related macular degeneration, left eye, with active choroidal neovascularization: Secondary | ICD-10-CM | POA: Diagnosis not present

## 2019-12-03 DIAGNOSIS — H353221 Exudative age-related macular degeneration, left eye, with active choroidal neovascularization: Secondary | ICD-10-CM | POA: Diagnosis not present

## 2020-01-07 ENCOUNTER — Other Ambulatory Visit: Payer: Self-pay

## 2020-01-07 ENCOUNTER — Encounter: Payer: Self-pay | Admitting: Internal Medicine

## 2020-01-07 ENCOUNTER — Ambulatory Visit (INDEPENDENT_AMBULATORY_CARE_PROVIDER_SITE_OTHER): Payer: Medicare Other | Admitting: Internal Medicine

## 2020-01-07 VITALS — BP 124/80 | HR 77 | Temp 98.8°F | Ht 61.0 in | Wt 205.6 lb

## 2020-01-07 DIAGNOSIS — L989 Disorder of the skin and subcutaneous tissue, unspecified: Secondary | ICD-10-CM

## 2020-01-07 DIAGNOSIS — R7309 Other abnormal glucose: Secondary | ICD-10-CM | POA: Diagnosis not present

## 2020-01-07 DIAGNOSIS — I739 Peripheral vascular disease, unspecified: Secondary | ICD-10-CM

## 2020-01-07 DIAGNOSIS — Z6838 Body mass index (BMI) 38.0-38.9, adult: Secondary | ICD-10-CM

## 2020-01-07 DIAGNOSIS — I10 Essential (primary) hypertension: Secondary | ICD-10-CM

## 2020-01-07 MED ORDER — LISINOPRIL 5 MG PO TABS: 5.0000 mg | ORAL_TABLET | Freq: Every day | ORAL | 1 refills | Status: DC

## 2020-01-07 NOTE — Progress Notes (Signed)
This visit occurred during the SARS-CoV-2 public health emergency.  Safety protocols were in place, including screening questions prior to the visit, additional usage of staff PPE, and extensive cleaning of exam room while observing appropriate contact time as indicated for disinfecting solutions.  Subjective:     Patient ID: Tricia Ferguson , female    DOB: [birth year] , 86 y.o.   MRN: [id number]   Chief Complaint  Patient presents with  . Hypertension    HPI  She is here today for BP check. She reports compliance with her meds.   Hypertension This is a chronic problem. The current episode started more than 1 year ago. The problem has been gradually improving since onset. The problem is controlled. Pertinent negatives include no blurred vision, chest pain, palpitations or shortness of breath. Risk factors for coronary artery disease include obesity and post-menopausal state. The current treatment provides moderate improvement.     Past Medical History:  Diagnosis Date  . Chronic kidney disease   . Hypertension   . Peripheral vascular disease ([place])   . Vitamin D deficiency disease      Family History  Problem Relation Age of Onset  . Hypertension Mother   . Throat cancer Mother   . Prostate cancer Father   . Tremor Father   . Hypertension Sister   . Diabetes Sister   . Hypertension Brother   . Hypertension Sister   . Diabetes Sister   . Hypertension Sister      Current Outpatient Medications:  .  Ascorbic Acid (VITAMIN C PO), Take by mouth., Disp: , Rfl:  .  calcium carbonate (TUMS - DOSED IN MG ELEMENTAL CALCIUM) 500 MG chewable tablet, Chew 2 tablets by mouth daily. , Disp: , Rfl:  .  Cholecalciferol (VITAMIN D3 PO), Take by mouth., Disp: , Rfl:  .  fish oil-omega-3 fatty acids 1000 MG capsule, Take 2 g by mouth daily., Disp: , Rfl:  .  Garlic 7858 MG CAPS, Take by mouth., Disp: , Rfl:  .  glucosamine-chondroitin 500-400 MG tablet, Take 1 tablet by mouth 3  (three) times daily., Disp: , Rfl:  .  lisinopril (ZESTRIL) 5 MG tablet, Take 1 tablet (5 mg total) by mouth daily., Disp: 90 tablet, Rfl: 1 .  loratadine (CLARITIN) 10 MG tablet, Take 10 mg by mouth daily., Disp: , Rfl:  .  Multiple Vitamin (MULTIVITAMIN) tablet, Take 1 tablet by mouth daily., Disp: , Rfl:  .  Multiple Vitamins-Minerals (PRESERVISION AREDS PO), Take by mouth., Disp: , Rfl:  .  pantoprazole (PROTONIX) 40 MG tablet, Take 1 tablet (40 mg total) by mouth daily. 30 minutes before breakfast., Disp: 30 tablet, Rfl: 11 .  naproxen (NAPROSYN) 375 MG tablet, Take 1 tablet (375 mg total) by mouth 2 (two) times daily. (Patient not taking: Reported on 01/07/2020), Disp: 20 tablet, Rfl: 0 .  phenazopyridine (PYRIDIUM) 100 MG tablet, Take 1 tablet (100 mg total) by mouth 3 (three) times daily as needed for pain (bladder pain). (Patient not taking: Reported on 01/07/2020), Disp: 6 tablet, Rfl: 0   Allergies  Allergen Reactions  . Amoxicillin Itching  . Penicillins Hives  . Potassium-Containing Compounds Itching     Review of Systems  Constitutional: Negative.   Eyes: Negative for blurred vision.  Respiratory: Negative.  Negative for shortness of breath.   Cardiovascular: Negative.  Negative for chest pain and palpitations.  Gastrointestinal: Negative.   Skin: Positive for rash.       She has two  skin lesions on her back. She wants me to check them.   Neurological: Negative.   Psychiatric/Behavioral: Negative.      Today's Vitals   01/07/20 1007  BP: 124/80  Pulse: 77  Temp: 98.8 F (37.1 C)  TempSrc: Oral  Weight: 205 lb 9.6 oz (93.3 kg)  Height: '5\' 1"'  (1.549 m)  PainSc: 7   PainLoc: Knee   Body mass index is 38.85 kg/m.   Objective:  Physical Exam Vitals and nursing note reviewed.  Constitutional:      Appearance: Normal appearance. She is obese.  HENT:     Head: Normocephalic and atraumatic.  Cardiovascular:     Rate and Rhythm: Normal rate and regular rhythm.      Heart sounds: Normal heart sounds.  Pulmonary:     Effort: Pulmonary effort is normal.     Breath sounds: Normal breath sounds.  Skin:    General: Skin is warm.     Comments: Round, hyperpigmented lesion on left upper back, scaly. No surrounding erythema or vesicles noted.   Neurological:     General: No focal deficit present.     Mental Status: She is alert.  Psychiatric:        Mood and Affect: Mood normal.        Behavior: Behavior normal.         Assessment And Plan:     1. Essential hypertension, benign  Chronic, well controlled. She will continue with current meds. She is encouraged to avoid adding salt to her foods. I will check renal function today. She will RTO in six months for her next physical examination.   - CMP14+EGFR  2. Skin lesion of back  This appears to be benign. She is advised to apply OTC hydrocortisone cream to affected area twice daily as needed.    3. Other abnormal glucose  HER A1C HAS BEEN ELEVATED IN THE PAST. I WILL CHECK AN A1C, BMET TODAY. SHE WAS ENCOURAGED TO AVOID SUGARY BEVERAGES AND PROCESSED FOODS INCLUDNG BREADS, RICE AND PASTA.  - Hemoglobin A1c  4. PAD (peripheral artery disease) (HCC)  Chronic. I will check lipid panel today. She is encouraged to engage in at least 150 minutes of exercise per wee.  5. Class 2 severe obesity due to excess calories with serious comorbidity and body mass index (BMI) of 38.0 to 38.9 in adult [facility])  She plans on starting Weight Watchers. If she is not successful, she agrees to referral to [facility] clinic. She is encouraged to strive for BMI less than 32 to decrease cardiac risk. She is encouraged to try to lose 10-20 pounds prior to her next visit in August 2021.       [name], MD    THE PATIENT IS ENCOURAGED TO PRACTICE SOCIAL DISTANCING DUE TO THE COVID-19 PANDEMIC.

## 2020-01-07 NOTE — Patient Instructions (Signed)
Exercises To Do While Sitting  Exercises that you do while sitting (chair exercises) can give you many of the same benefits as full exercise. Benefits include strengthening your heart, burning calories, and keeping muscles and joints healthy. Exercise can also improve your mood and help with depression and anxiety. You may benefit from chair exercises if you are unable to do standing exercises because of:  Diabetic foot pain.  Obesity.  Illness.  Arthritis.  Recovery from surgery or injury.  Breathing problems.  Balance problems.  Another type of disability. Before starting chair exercises, check with your health care provider or a physical therapist to find out how much exercise you can tolerate and which exercises are safe for you. If your health care provider approves:  Start out slowly and build up over time. Aim to work up to about 10-20 minutes for each exercise session.  Make exercise part of your daily routine.  Drink water when you exercise. Do not wait until you are thirsty. Drink every 10-15 minutes.  Stop exercising right away if you have pain, nausea, shortness of breath, or dizziness.  If you are exercising in a wheelchair, make sure to lock the wheels.  Ask your health care provider whether you can do tai chi or yoga. Many positions in these mind-body exercises can be modified to do while seated. Warm-up Before starting other exercises: 1. Sit up as straight as you can. Have your knees bent at 90 degrees, which is the shape of the capital letter "L." Keep your feet flat on the floor. 2. Sit at the front edge of your chair, if you can. 3. Pull in (tighten) the muscles in your abdomen and stretch your spine and neck as straight as you can. Hold this position for a few minutes. 4. Breathe in and out evenly. Try to concentrate on your breathing, and relax your mind. Stretching Exercise A: Arm stretch 1. Hold your arms out straight in front of your body. 2. Bend  your hands at the wrist with your fingers pointing up, as if signaling someone to stop. Notice the slight tension in your forearms as you hold the position. 3. Keeping your arms out and your hands bent, rotate your hands outward as far as you can and hold this stretch. Aim to have your thumbs pointing up and your pinkie fingers pointing down. Slowly repeat arm stretches for one minute as tolerated. Exercise B: Leg stretch 1. If you can move your legs, try to "draw" letters on the floor with the toes of your foot. Write your name with one foot. 2. Write your name with the toes of your other foot. Slowly repeat the movements for one minute as tolerated. Exercise C: Reach for the sky 1. Reach your hands as far over your head as you can to stretch your spine. 2. Move your hands and arms as if you are climbing a rope. Slowly repeat the movements for one minute as tolerated. Range of motion exercises Exercise A: Shoulder roll 1. Let your arms hang loosely at your sides. 2. Lift just your shoulders up toward your ears, then let them relax back down. 3. When your shoulders feel loose, rotate your shoulders in backward and forward circles. Do shoulder rolls slowly for one minute as tolerated. Exercise B: March in place 1. As if you are marching, pump your arms and lift your legs up and down. Lift your knees as high as you can. ? If you are unable to lift your knees,  just pump your arms and move your ankles and feet up and down. March in place for one minute as tolerated. Exercise C: Seated jumping jacks 1. Let your arms hang down straight. 2. Keeping your arms straight, lift them up over your head. Aim to point your fingers to the ceiling. 3. While you lift your arms, straighten your legs and slide your heels along the floor to your sides, as wide as you can. 4. As you bring your arms back down to your sides, slide your legs back together. ? If you are unable to use your legs, just move your  arms. Slowly repeat seated jumping jacks for one minute as tolerated. Strengthening exercises Exercise A: Shoulder squeeze 1. Hold your arms straight out from your body to your sides, with your elbows bent and your fists pointed at the ceiling. 2. Keeping your arms in the bent position, move them forward so your elbows and forearms meet in front of your face. 3. Open your arms back out as wide as you can with your elbows still bent, until you feel your shoulder blades squeezing together. Hold for 5 seconds. Slowly repeat the movements forward and backward for one minute as tolerated. Contact a health care provider if you:  Had to stop exercising due to any of the following: ? Pain. ? Nausea. ? Shortness of breath. ? Dizziness. ? Fatigue.  Have significant pain or soreness after exercising. Get help right away if you have:  Chest pain.  Difficulty breathing. These symptoms may represent a serious problem that is an emergency. Do not wait to see if the symptoms will go away. Get medical help right away. Call your local emergency services (911 in the [place] Do not drive yourself to the hospital. This information is not intended to replace advice given to you by your health care provider. Make sure you discuss any questions you have with your health care provider. Document Revised: 03/22/2019 Document Reviewed: 10/12/2017 Elsevier Patient Education  2020 [employer].

## 2020-01-08 LAB — CMP14+EGFR
ALT: 19 IU/L (ref 0–32)
AST: 21 IU/L (ref 0–40)
Albumin/Globulin Ratio: 1.5 (ref 1.2–2.2)
Albumin: 4.3 g/dL (ref 3.6–4.6)
Alkaline Phosphatase: 103 IU/L (ref 39–117)
BUN/Creatinine Ratio: 29 — ABNORMAL HIGH (ref 12–28)
BUN: 26 mg/dL (ref 8–27)
Bilirubin Total: 0.4 mg/dL (ref 0.0–1.2)
CO2: 22 mmol/L (ref 20–29)
Calcium: 9.6 mg/dL (ref 8.7–10.3)
Chloride: 106 mmol/L (ref 96–106)
Creatinine, Ser: 0.91 mg/dL (ref 0.57–1.00)
GFR calc Af Amer: 67 mL/min/{1.73_m2} (ref >59)
GFR calc non Af Amer: 59 mL/min/{1.73_m2} — ABNORMAL LOW (ref >59)
Globulin, Total: 2.8 g/dL (ref 1.5–4.5)
Glucose: 87 mg/dL (ref 65–99)
Potassium: 4.9 mmol/L (ref 3.5–5.2)
Sodium: 141 mmol/L (ref 134–144)
Total Protein: 7.1 g/dL (ref 6.0–8.5)

## 2020-01-08 LAB — LIPID PANEL
Chol/HDL Ratio: 2.1 ratio (ref 0.0–4.4)
Cholesterol, Total: 145 mg/dL (ref 100–199)
HDL: 69 mg/dL (ref >39)
LDL Chol Calc (NIH): 65 mg/dL (ref 0–99)
Triglycerides: 53 mg/dL (ref 0–149)
VLDL Cholesterol Cal: 11 mg/dL (ref 5–40)

## 2020-01-08 LAB — HEMOGLOBIN A1C
Est. average glucose Bld gHb Est-mCnc: 105 mg/dL
Hgb A1c MFr Bld: 5.3 % (ref 4.8–5.6)

## 2020-01-15 DIAGNOSIS — H353221 Exudative age-related macular degeneration, left eye, with active choroidal neovascularization: Secondary | ICD-10-CM | POA: Diagnosis not present

## 2020-01-16 ENCOUNTER — Other Ambulatory Visit: Payer: Self-pay | Admitting: Internal Medicine

## 2020-01-16 DIAGNOSIS — Z1231 Encounter for screening mammogram for malignant neoplasm of breast: Secondary | ICD-10-CM

## 2020-02-06 ENCOUNTER — Other Ambulatory Visit: Payer: Self-pay

## 2020-02-07 ENCOUNTER — Other Ambulatory Visit: Payer: Self-pay

## 2020-02-07 MED ORDER — NAPROXEN 375 MG PO TABS: 375.0000 mg | ORAL_TABLET | Freq: Two times a day (BID) | ORAL | 0 refills | Status: DC

## 2020-02-13 ENCOUNTER — Other Ambulatory Visit: Payer: Self-pay

## 2020-02-13 ENCOUNTER — Encounter: Payer: Self-pay | Admitting: Internal Medicine

## 2020-02-13 ENCOUNTER — Ambulatory Visit (INDEPENDENT_AMBULATORY_CARE_PROVIDER_SITE_OTHER): Payer: Medicare Other | Admitting: Internal Medicine

## 2020-02-13 VITALS — BP 126/82 | HR 84 | Temp 98.9°F | Ht 61.0 in | Wt 206.0 lb

## 2020-02-13 DIAGNOSIS — M25552 Pain in left hip: Secondary | ICD-10-CM | POA: Diagnosis not present

## 2020-02-13 DIAGNOSIS — E559 Vitamin D deficiency, unspecified: Secondary | ICD-10-CM | POA: Diagnosis not present

## 2020-02-13 DIAGNOSIS — M79644 Pain in right finger(s): Secondary | ICD-10-CM | POA: Diagnosis not present

## 2020-02-13 DIAGNOSIS — Z6838 Body mass index (BMI) 38.0-38.9, adult: Secondary | ICD-10-CM

## 2020-02-13 DIAGNOSIS — M791 Myalgia, unspecified site: Secondary | ICD-10-CM

## 2020-02-13 DIAGNOSIS — M25551 Pain in right hip: Secondary | ICD-10-CM

## 2020-02-13 DIAGNOSIS — R1031 Right lower quadrant pain: Secondary | ICD-10-CM

## 2020-02-13 DIAGNOSIS — I10 Essential (primary) hypertension: Secondary | ICD-10-CM

## 2020-02-13 DIAGNOSIS — R1032 Left lower quadrant pain: Secondary | ICD-10-CM

## 2020-02-13 LAB — POCT URINALYSIS DIPSTICK
Bilirubin, UA: NEGATIVE
Blood, UA: NEGATIVE
Glucose, UA: NEGATIVE
Ketones, UA: NEGATIVE
Leukocytes, UA: NEGATIVE
Nitrite, UA: NEGATIVE
Protein, UA: NEGATIVE
Spec Grav, UA: 1.025 (ref 1.010–1.025)
Urobilinogen, UA: 0.2 E.U./dL
pH, UA: 5.5 (ref 5.0–8.0)

## 2020-02-13 MED ORDER — KETOROLAC TROMETHAMINE 30 MG/ML IJ SOLN
30.0000 mg | Freq: Once | INTRAMUSCULAR | Status: AC
  Administered 2020-02-13: 30 mg via INTRAMUSCULAR

## 2020-02-13 MED ORDER — TRAMADOL HCL 50 MG PO TABS: 50.0000 mg | ORAL_TABLET | Freq: Four times a day (QID) | ORAL | 0 refills | Status: DC | PRN

## 2020-02-13 NOTE — Progress Notes (Addendum)
This visit occurred during the SARS-CoV-2 public health emergency.  Safety protocols were in place, including screening questions prior to the visit, additional usage of staff PPE, and extensive cleaning of exam room while observing appropriate contact time as indicated for disinfecting solutions.  Subjective:     Patient ID: Tricia Ferguson , female    DOB: 11-21-1934 , 86 y.o.   MRN: [id number]   Chief Complaint  Patient presents with  . Generalized Body Aches    right middle finger-neck-stomach-    HPI  She is here today for further evaluation of various aches and pains.  She c/o right finger pain - wants to make sure she did not have a stroke. She denies tingling in b/l hands and she denies weakness. Denies fall/trauma.  States sx started about 3 weeks ago.  Also with LBP, b/l hips and lower abdominal/groin pain. Also with b/l upper thigh pain and severe muscle aches. Admits she has difficulty getting in/out of bed.     Past Medical History:  Diagnosis Date  . Chronic kidney disease   . Hypertension   . Peripheral vascular disease ([place])   . Vitamin D deficiency disease      Family History  Problem Relation Age of Onset  . Hypertension Mother   . Throat cancer Mother   . Prostate cancer Father   . Tremor Father   . Hypertension Sister   . Diabetes Sister   . Hypertension Brother   . Hypertension Sister   . Diabetes Sister   . Hypertension Sister      Current Outpatient Medications:  .  Ascorbic Acid (VITAMIN C PO), Take by mouth., Disp: , Rfl:  .  calcium carbonate (TUMS - DOSED IN MG ELEMENTAL CALCIUM) 500 MG chewable tablet, Chew 2 tablets by mouth daily. , Disp: , Rfl:  .  Cholecalciferol (VITAMIN D3 PO), Take by mouth., Disp: , Rfl:  .  fish oil-omega-3 fatty acids 1000 MG capsule, Take 2 g by mouth daily., Disp: , Rfl:  .  Garlic 3559 MG CAPS, Take by mouth., Disp: , Rfl:  .  glucosamine-chondroitin 500-400 MG tablet, Take 1 tablet by mouth 3 (three)  times daily., Disp: , Rfl:  .  lisinopril (ZESTRIL) 5 MG tablet, Take 1 tablet (5 mg total) by mouth daily., Disp: 90 tablet, Rfl: 1 .  loratadine (CLARITIN) 10 MG tablet, Take 10 mg by mouth daily., Disp: , Rfl:  .  Multiple Vitamins-Minerals (PRESERVISION AREDS PO), Take by mouth., Disp: , Rfl:  .  naproxen (NAPROSYN) 375 MG tablet, Take 1 tablet (375 mg total) by mouth 2 (two) times daily., Disp: 30 tablet, Rfl: 0 .  pantoprazole (PROTONIX) 40 MG tablet, Take 1 tablet (40 mg total) by mouth daily. 30 minutes before breakfast., Disp: 30 tablet, Rfl: 11 .  Multiple Vitamin (MULTIVITAMIN) tablet, Take 1 tablet by mouth daily., Disp: , Rfl:  .  phenazopyridine (PYRIDIUM) 100 MG tablet, Take 1 tablet (100 mg total) by mouth 3 (three) times daily as needed for pain (bladder pain). (Patient not taking: Reported on 01/07/2020), Disp: 6 tablet, Rfl: 0   Allergies  Allergen Reactions  . Amoxicillin Itching  . Penicillins Hives  . Potassium-Containing Compounds Itching     Review of Systems  Constitutional: Negative.   Respiratory: Negative.   Cardiovascular: Negative.   Gastrointestinal: Negative.   Musculoskeletal: Positive for arthralgias and myalgias.  Neurological: Negative.   Psychiatric/Behavioral: Negative.      Today's Vitals   02/13/20 0857  BP: 126/82  Pulse: 84  Temp: 98.9 F (37.2 C)  TempSrc: Oral  Weight: 206 lb (93.4 kg)  Height: '5\' 1"'  (1.549 m)  PainSc: 8   PainLoc: Generalized   Body mass index is 38.92 kg/m.   Objective:  Physical Exam Vitals and nursing note reviewed.  Constitutional:      Appearance: Normal appearance.  HENT:     Head: Normocephalic and atraumatic.  Cardiovascular:     Rate and Rhythm: Normal rate and regular rhythm.     Heart sounds: Normal heart sounds.  Pulmonary:     Effort: Pulmonary effort is normal.     Breath sounds: Normal breath sounds.  Musculoskeletal:     Comments: Swelling of PIP/DIP joints of right hand. Squeeze test  negative.   Skin:    General: Skin is warm.  Neurological:     General: No focal deficit present.     Mental Status: She is alert.  Psychiatric:        Mood and Affect: Mood normal.        Behavior: Behavior normal.         Assessment And Plan:     1. Pain of right middle finger  She does have some arthritic changes. I will check an arthritis panel. She is advised to apply Voltaren gel to affected area twice daily. She will let me know if her sx persist.   2. Bilateral hip pain  I will check arthritis panel. She was also given rx tramadol to use prn. Review of the [place] CSRS was performed in accordance of the [place] prior to dispensing any controlled drugs. She was given Toradol, 6m IM x1.   - ANA, IFA (with reflex) - CYCLIC CITRUL PEPTIDE ANTIBODY, IGG/IGA - Rheumatoid factor - Sedimentation rate - Uric acid  3. Myalgia  I will check labs as listed below. She is encouraged to stay well hydrated. Also advised to take magnesium nightly. Advised she can get this over the counter and to start with 4016mnightly.   - CK, total - BMP8+EGFR   RoMaximino GreenlandMD    THE PATIENT IS ENCOURAGED TO PRACTICE SOCIAL DISTANCING DUE TO THE COVID-19 PANDEMIC.

## 2020-02-15 ENCOUNTER — Ambulatory Visit: Payer: Self-pay

## 2020-02-15 DIAGNOSIS — M25551 Pain in right hip: Secondary | ICD-10-CM

## 2020-02-15 DIAGNOSIS — M25552 Pain in left hip: Secondary | ICD-10-CM

## 2020-02-15 DIAGNOSIS — M791 Myalgia, unspecified site: Secondary | ICD-10-CM

## 2020-02-15 DIAGNOSIS — I10 Essential (primary) hypertension: Secondary | ICD-10-CM

## 2020-02-15 LAB — BMP8+EGFR
BUN/Creatinine Ratio: 25 (ref 12–28)
BUN: 23 mg/dL (ref 8–27)
CO2: 22 mmol/L (ref 20–29)
Calcium: 9.4 mg/dL (ref 8.7–10.3)
Chloride: 101 mmol/L (ref 96–106)
Creatinine, Ser: 0.91 mg/dL (ref 0.57–1.00)
GFR calc Af Amer: 67 mL/min/{1.73_m2} (ref >59)
GFR calc non Af Amer: 58 mL/min/{1.73_m2} — ABNORMAL LOW (ref >59)
Glucose: 91 mg/dL (ref 65–99)
Potassium: 4.5 mmol/L (ref 3.5–5.2)
Sodium: 140 mmol/L (ref 134–144)

## 2020-02-15 LAB — URIC ACID: Uric Acid: 5.4 mg/dL (ref 3.1–7.9)

## 2020-02-15 LAB — VITAMIN D 25 HYDROXY (VIT D DEFICIENCY, FRACTURES): Vit D, 25-Hydroxy: 67.9 ng/mL (ref 30.0–100.0)

## 2020-02-15 LAB — ANTINUCLEAR ANTIBODIES, IFA: ANA Titer 1: NEGATIVE

## 2020-02-15 LAB — SEDIMENTATION RATE: Sed Rate: 94 mm/hr — ABNORMAL HIGH (ref 0–40)

## 2020-02-15 LAB — CK: Total CK: 47 U/L (ref 26–161)

## 2020-02-15 LAB — CYCLIC CITRUL PEPTIDE ANTIBODY, IGG/IGA: Cyclic Citrullin Peptide Ab: 17 units (ref 0–19)

## 2020-02-15 LAB — RHEUMATOID FACTOR: Rheumatoid fact SerPl-aCnc: 10.5 IU/mL (ref 0.0–13.9)

## 2020-02-15 LAB — TSH: TSH: 1.51 u[IU]/mL (ref 0.450–4.500)

## 2020-02-15 NOTE — Chronic Care Management (AMB) (Signed)
  Care Management Note   Tricia Ferguson is a 86 y.o. year old female who is a primary care patient of Glendale Chard, MD . The CM team was consulted for assistance with care coordination.   Review of patient status, including review of consultants reports, rand collaboration with appropriate care team members and the patient's provider was performed as part of comprehensive patient evaluation and provision of care management services. Telephone outreach to patient today to introduce CM services.   SDOH (Social Determinants of Health) assessments performed: No    Outpatient Encounter Medications as of 02/15/2020  Medication Sig  . Ascorbic Acid (VITAMIN C PO) Take by mouth.  . calcium carbonate (TUMS - DOSED IN MG ELEMENTAL CALCIUM) 500 MG chewable tablet Chew 2 tablets by mouth daily.   . Cholecalciferol (VITAMIN D3 PO) Take by mouth.  . fish oil-omega-3 fatty acids 1000 MG capsule Take 2 g by mouth daily.  . Garlic 3335 MG CAPS Take by mouth.  Marland Kitchen glucosamine-chondroitin 500-400 MG tablet Take 1 tablet by mouth 3 (three) times daily.  Marland Kitchen lisinopril (ZESTRIL) 5 MG tablet Take 1 tablet (5 mg total) by mouth daily.  Marland Kitchen loratadine (CLARITIN) 10 MG tablet Take 10 mg by mouth daily.  . Multiple Vitamin (MULTIVITAMIN) tablet Take 1 tablet by mouth daily.  . Multiple Vitamins-Minerals (PRESERVISION AREDS PO) Take by mouth.  . naproxen (NAPROSYN) 375 MG tablet Take 1 tablet (375 mg total) by mouth 2 (two) times daily.  . pantoprazole (PROTONIX) 40 MG tablet Take 1 tablet (40 mg total) by mouth daily. 30 minutes before breakfast.  . phenazopyridine (PYRIDIUM) 100 MG tablet Take 1 tablet (100 mg total) by mouth 3 (three) times daily as needed for pain (bladder pain). (Patient not taking: Reported on 01/07/2020)  . traMADol (ULTRAM) 50 MG tablet Take 1 tablet (50 mg total) by mouth every 6 (six) hours as needed.   No facility-administered encounter medications on file as of 02/15/2020.    I  reached out to [name] by phone today.   Ms. Miyamoto was given information about Chronic Care Management services today including:  1. CCM service includes personalized support from designated clinical staff supervised by her physician, including individualized plan of care and coordination with other care providers 2. 24/7 contact phone numbers for assistance for urgent and routine care needs. 3. Service will only be billed when office clinical staff spend 20 minutes or more in a month to coordinate care. 4. Only one practitioner may furnish and bill the service in a calendar month. 5. The patient may stop CCM services at any time (effective at the end of the month) by phone call to the office staff. 6. The patient will be responsible for cost sharing (co-pay) of up to 20% of the service fee (after annual deductible is met).   Patient did not agree to services and wishes to consider information provided before deciding about enrollment in care management services.    Follow Up Plan: SW will follow up with patient by phone over the next 10 days   Daneen Schick, BSW, CDP Social Worker, Certified Dementia Practitioner [place] [facility] [place] [facility] [phone]

## 2020-02-18 ENCOUNTER — Other Ambulatory Visit: Payer: Self-pay

## 2020-02-18 DIAGNOSIS — M549 Dorsalgia, unspecified: Secondary | ICD-10-CM

## 2020-02-19 ENCOUNTER — Other Ambulatory Visit: Payer: Self-pay | Admitting: Internal Medicine

## 2020-02-20 DIAGNOSIS — H353221 Exudative age-related macular degeneration, left eye, with active choroidal neovascularization: Secondary | ICD-10-CM | POA: Diagnosis not present

## 2020-02-21 ENCOUNTER — Ambulatory Visit: Payer: Self-pay

## 2020-02-21 DIAGNOSIS — M791 Myalgia, unspecified site: Secondary | ICD-10-CM

## 2020-02-21 DIAGNOSIS — I10 Essential (primary) hypertension: Secondary | ICD-10-CM

## 2020-02-21 NOTE — Chronic Care Management (AMB) (Signed)
  Care Management Note   Tricia Ferguson is a 86 y.o. year old female who is a primary care patient of Glendale Chard, MD . The CM team was consulted for assistance with care coordination.   Review of patient status, including review of consultants reports, rand collaboration with appropriate care team members and the patient's provider was performed as part of comprehensive patient evaluation and provision of care management services. Telephone outreach to patient today to introduce CM services.   SDOH (Social Determinants of Health) assessments performed: No    Outpatient Encounter Medications as of 02/21/2020  Medication Sig  . Ascorbic Acid (VITAMIN C PO) Take by mouth.  . calcium carbonate (TUMS - DOSED IN MG ELEMENTAL CALCIUM) 500 MG chewable tablet Chew 2 tablets by mouth daily.   . Cholecalciferol (VITAMIN D3 PO) Take by mouth.  . fish oil-omega-3 fatty acids 1000 MG capsule Take 2 g by mouth daily.  . Garlic 3704 MG CAPS Take by mouth.  Marland Kitchen glucosamine-chondroitin 500-400 MG tablet Take 1 tablet by mouth 3 (three) times daily.  Marland Kitchen lisinopril (ZESTRIL) 5 MG tablet Take 1 tablet (5 mg total) by mouth daily.  Marland Kitchen loratadine (CLARITIN) 10 MG tablet Take 10 mg by mouth daily.  . Multiple Vitamin (MULTIVITAMIN) tablet Take 1 tablet by mouth daily.  . Multiple Vitamins-Minerals (PRESERVISION AREDS PO) Take by mouth.  . naproxen (NAPROSYN) 375 MG tablet TAKE 1 TABLET(375 MG) BY MOUTH TWICE DAILY  . pantoprazole (PROTONIX) 40 MG tablet Take 1 tablet (40 mg total) by mouth daily. 30 minutes before breakfast.  . phenazopyridine (PYRIDIUM) 100 MG tablet Take 1 tablet (100 mg total) by mouth 3 (three) times daily as needed for pain (bladder pain). (Patient not taking: Reported on 01/07/2020)  . traMADol (ULTRAM) 50 MG tablet Take 1 tablet (50 mg total) by mouth every 6 (six) hours as needed.   No facility-administered encounter medications on file as of 02/21/2020.    I reached out to  [name] by phone today as a follow up call to discuss enrollment decision.   [name] was given information about Chronic Care Management services today including:  1. CCM service includes personalized support from designated clinical staff supervised by her physician, including individualized plan of care and coordination with other care providers 2. 24/7 contact phone numbers for assistance for urgent and routine care needs. 3. Service will only be billed when office clinical staff spend 20 minutes or more in a month to coordinate care. 4. Only one practitioner may furnish and bill the service in a calendar month. 5. The patient may stop CCM services at any time (effective at the end of the month) by phone call to the office staff. 6. The patient will be responsible for cost sharing (co-pay) of up to 20% of the service fee (after annual deductible is met).   Patient did not agree to enrollment in care management services and does not wish to consider at this time. The patient reports she is financially unable to participate due to copayment amount.    Follow Up Plan: SW advised patient to contact her primary provider if she would like to participate in the future.   Daneen Schick, BSW, CDP Social Worker, Certified Dementia Practitioner [place] [facility] [place] [facility] [phone]

## 2020-02-25 ENCOUNTER — Ambulatory Visit: Payer: Medicare Other

## 2020-03-03 ENCOUNTER — Other Ambulatory Visit: Payer: Self-pay | Admitting: Internal Medicine

## 2020-03-05 DIAGNOSIS — H524 Presbyopia: Secondary | ICD-10-CM | POA: Diagnosis not present

## 2020-03-17 DIAGNOSIS — R262 Difficulty in walking, not elsewhere classified: Secondary | ICD-10-CM | POA: Diagnosis not present

## 2020-03-17 DIAGNOSIS — M6281 Muscle weakness (generalized): Secondary | ICD-10-CM | POA: Diagnosis not present

## 2020-03-17 DIAGNOSIS — R293 Abnormal posture: Secondary | ICD-10-CM | POA: Diagnosis not present

## 2020-03-17 DIAGNOSIS — M545 Low back pain: Secondary | ICD-10-CM | POA: Diagnosis not present

## 2020-03-19 DIAGNOSIS — M545 Low back pain: Secondary | ICD-10-CM | POA: Diagnosis not present

## 2020-03-19 DIAGNOSIS — M6281 Muscle weakness (generalized): Secondary | ICD-10-CM | POA: Diagnosis not present

## 2020-03-19 DIAGNOSIS — R293 Abnormal posture: Secondary | ICD-10-CM | POA: Diagnosis not present

## 2020-03-19 DIAGNOSIS — R262 Difficulty in walking, not elsewhere classified: Secondary | ICD-10-CM | POA: Diagnosis not present

## 2020-03-21 DIAGNOSIS — M170 Bilateral primary osteoarthritis of knee: Secondary | ICD-10-CM | POA: Diagnosis not present

## 2020-03-21 DIAGNOSIS — M1712 Unilateral primary osteoarthritis, left knee: Secondary | ICD-10-CM | POA: Diagnosis not present

## 2020-03-21 DIAGNOSIS — M1711 Unilateral primary osteoarthritis, right knee: Secondary | ICD-10-CM | POA: Diagnosis not present

## 2020-03-24 ENCOUNTER — Other Ambulatory Visit: Payer: Self-pay | Admitting: Internal Medicine

## 2020-03-24 ENCOUNTER — Ambulatory Visit
Admission: RE | Admit: 2020-03-24 | Discharge: 2020-03-24 | Disposition: A | Discharge status: Home / Self Care | Payer: Medicare Other | Source: Ambulatory Visit | Attending: Internal Medicine | Admitting: Internal Medicine

## 2020-03-24 ENCOUNTER — Other Ambulatory Visit: Payer: Self-pay

## 2020-03-24 DIAGNOSIS — M6281 Muscle weakness (generalized): Secondary | ICD-10-CM | POA: Diagnosis not present

## 2020-03-24 DIAGNOSIS — M545 Low back pain: Secondary | ICD-10-CM | POA: Diagnosis not present

## 2020-03-24 DIAGNOSIS — R262 Difficulty in walking, not elsewhere classified: Secondary | ICD-10-CM | POA: Diagnosis not present

## 2020-03-24 DIAGNOSIS — Z1231 Encounter for screening mammogram for malignant neoplasm of breast: Secondary | ICD-10-CM

## 2020-03-24 DIAGNOSIS — R293 Abnormal posture: Secondary | ICD-10-CM | POA: Diagnosis not present

## 2020-03-26 DIAGNOSIS — H353221 Exudative age-related macular degeneration, left eye, with active choroidal neovascularization: Secondary | ICD-10-CM | POA: Diagnosis not present

## 2020-03-28 DIAGNOSIS — R262 Difficulty in walking, not elsewhere classified: Secondary | ICD-10-CM | POA: Diagnosis not present

## 2020-03-28 DIAGNOSIS — R293 Abnormal posture: Secondary | ICD-10-CM | POA: Diagnosis not present

## 2020-03-28 DIAGNOSIS — M545 Low back pain: Secondary | ICD-10-CM | POA: Diagnosis not present

## 2020-03-28 DIAGNOSIS — M6281 Muscle weakness (generalized): Secondary | ICD-10-CM | POA: Diagnosis not present

## 2020-03-31 DIAGNOSIS — M6281 Muscle weakness (generalized): Secondary | ICD-10-CM | POA: Diagnosis not present

## 2020-03-31 DIAGNOSIS — R293 Abnormal posture: Secondary | ICD-10-CM | POA: Diagnosis not present

## 2020-03-31 DIAGNOSIS — R262 Difficulty in walking, not elsewhere classified: Secondary | ICD-10-CM | POA: Diagnosis not present

## 2020-03-31 DIAGNOSIS — M545 Low back pain: Secondary | ICD-10-CM | POA: Diagnosis not present

## 2020-04-02 DIAGNOSIS — M545 Low back pain: Secondary | ICD-10-CM | POA: Diagnosis not present

## 2020-04-02 DIAGNOSIS — R293 Abnormal posture: Secondary | ICD-10-CM | POA: Diagnosis not present

## 2020-04-02 DIAGNOSIS — M6281 Muscle weakness (generalized): Secondary | ICD-10-CM | POA: Diagnosis not present

## 2020-04-02 DIAGNOSIS — R262 Difficulty in walking, not elsewhere classified: Secondary | ICD-10-CM | POA: Diagnosis not present

## 2020-04-07 ENCOUNTER — Other Ambulatory Visit: Payer: Self-pay | Admitting: Internal Medicine

## 2020-04-08 ENCOUNTER — Ambulatory Visit: Payer: Medicare Other | Admitting: Neurology

## 2020-04-08 ENCOUNTER — Telehealth: Payer: Self-pay

## 2020-04-08 NOTE — Telephone Encounter (Signed)
Pt showed up 9 mins late to her 1 pm appointment with Dr. Frances Furbish. This appointment was rescheduled and considered a no show.

## 2020-04-09 DIAGNOSIS — M6281 Muscle weakness (generalized): Secondary | ICD-10-CM | POA: Diagnosis not present

## 2020-04-09 DIAGNOSIS — R262 Difficulty in walking, not elsewhere classified: Secondary | ICD-10-CM | POA: Diagnosis not present

## 2020-04-09 DIAGNOSIS — R293 Abnormal posture: Secondary | ICD-10-CM | POA: Diagnosis not present

## 2020-04-09 DIAGNOSIS — M545 Low back pain: Secondary | ICD-10-CM | POA: Diagnosis not present

## 2020-04-14 DIAGNOSIS — R293 Abnormal posture: Secondary | ICD-10-CM | POA: Diagnosis not present

## 2020-04-14 DIAGNOSIS — M6281 Muscle weakness (generalized): Secondary | ICD-10-CM | POA: Diagnosis not present

## 2020-04-14 DIAGNOSIS — M545 Low back pain: Secondary | ICD-10-CM | POA: Diagnosis not present

## 2020-04-14 DIAGNOSIS — R262 Difficulty in walking, not elsewhere classified: Secondary | ICD-10-CM | POA: Diagnosis not present

## 2020-04-15 ENCOUNTER — Encounter: Payer: Self-pay | Admitting: Neurology

## 2020-04-15 ENCOUNTER — Other Ambulatory Visit: Payer: Self-pay

## 2020-04-15 ENCOUNTER — Ambulatory Visit: Payer: Medicare Other | Admitting: Neurology

## 2020-04-15 VITALS — BP 130/68 | HR 84 | Temp 97.3°F | Ht 61.0 in | Wt 198.3 lb

## 2020-04-15 DIAGNOSIS — G250 Essential tremor: Secondary | ICD-10-CM | POA: Diagnosis not present

## 2020-04-15 DIAGNOSIS — M19041 Primary osteoarthritis, right hand: Secondary | ICD-10-CM

## 2020-04-15 DIAGNOSIS — R351 Nocturia: Secondary | ICD-10-CM

## 2020-04-15 DIAGNOSIS — R0683 Snoring: Secondary | ICD-10-CM

## 2020-04-15 DIAGNOSIS — G479 Sleep disorder, unspecified: Secondary | ICD-10-CM

## 2020-04-15 DIAGNOSIS — R498 Other voice and resonance disorders: Secondary | ICD-10-CM

## 2020-04-15 NOTE — Patient Instructions (Addendum)
As discussed, I will order a home sleep test for evaluation of your sleep disorder.  If you have obstructive sleep apnea, we can consider treatment with an AutoPap machine.  We will take care of the insurance authorization and the sleep lab staff will call you to arrange for testing, including pickup of your test equipment from our sleep lab for testing at home.  As far as your tremor, I believe it is stable.  For your arthritis pain and swelling in your right hand, talk to Dr. Allyne Gee about seeing a rheumatologist.  You may want to take your naproxen that you have available twice daily for the next 2 to 3 days, consider adding turmeric daily as a supplement that can help with chronic inflammation as you also have arthritis affecting the knees. I plan to see you back after sleep testing.

## 2020-04-15 NOTE — Progress Notes (Signed)
Subjective:    Patient ID: Tricia Ferguson is a 86 y.o. female.  HPI     Interim history:   Tricia Ferguson is an 86 year old right-handed woman with an underlying medical history of peripheral vascular disease, hypertension, chronic kidney disease, arthritis, obesity, vitamin D deficiency and essential tremor, particularly head tremor with a family history of tremor, who presents for follow-up consultation of her sleep disorder. The patient is unaccompanied today. She missed an appointment on 04/08/20. I saw her on 07/17/2019 at the request of her primary care physician, at which time she reported snoring and excessive daytime somnolence.  I had previously seen her in March 2020 for her head tremor.  She did not proceed with sleep study testing as she wanted to think about it and discuss it with her daughter.  Today, 04/15/20: She reports that she did not pursue sleep testing due to concern about the cost.  She is also reluctant to come in for testing overnight.  She would be willing to consider a home sleep test.  Her tremor is stable for the most part, she has a head tremor and voice tremor.  She is bothered by pain and swelling in the right hand, suffers from arthritis.  She sees orthopedics for her knee arthritis, had arthroscopic surgery to the right knee and some 3 weeks ago she received a cortisone injection.  She walks with a 4 pronged cane.  She is bothered by swelling in the right middle finger and also the base of her right index finger.  She has not been taking her naproxen as she was advised not to take it on a daily basis.   The patient's allergies, current medications, family history, past medical history, past social history, past surgical history and problem list were reviewed and updated as appropriate.   Previously:   07/17/19: (She) reports snoring and excessive daytime somnolence.  I first met her on 02/27/2019 for evaluation of her head tremor.  She had a rather mild  appearing tremor but history and examination in keeping with essential tremor.  We mutually agreed to continue to monitor her symptoms and examination.  She did report not sleeping very well at the time.  Her husband used to tell her that she snores.  She lives alone.  She has 3 grown daughters.  She reports that her tremor has been fairly stable.   Her Epworth sleepiness score is 11 out of 24, fatigue severity score is 32 out of 63. She goes to bed between 930 and 10, some nights she sleeps well and some nights she has a hard time going to sleep.  Melatonin has helped a little bit, and sometimes she takes 2 Tylenol at bedtime which also helps a little bit.  She has a rise time of around 4 but sometimes he may wake up around 1 AM and would not go back to sleep.  She has significant nocturia, is typically up 4 or 5 times to go to the bathroom.  She has had rare morning headaches.  She would like to think about sleep study testing.    02/27/2019: (She) reports a several month history of head tremor. I reviewed your office note from 01/04/2019.symptoms have been progressive, she endorses a family history of tremor in her father who had a head tremor, and youngest sister has a head tremor. Patient is the second oldest of a total of 7 children, she had 3 brothers, 2 passed away and has 3 sisters alive. 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 Other than that she is not particularly bothered by the tremor O notices it the tremor. She had successful cataract surgeries recently, right side was done in August 2019, left side was done in September 2019. She has not required any prescription eyeglasses, likes to use eyeglasses nevertheless with no strength, stating that she is so used to using eyeglasses. She also has readers if needed. She has been referred to physical therapy and they reached out to her but she has not called him back for  scheduling an appointment for her balance issue. She had a recent fall. She has right knee problems and recently received an injection into the right knee less than a week ago and is status post right knee arthroscopic surgery in 2005. She has a cane but does not typically use it. She has not had much in the way of hand tremor, with the exception of a very slight intermittent hand tremor which is not very bothersome to her. She is widowed and lives alone, she has 3 children. She quit smoking in 2005, does not utilize alcohol and drinks caffeine occasionally, not daily.  She reduced her caffeine intake years ago because of a remote history of palpitations. She has noted a voice tremor. Of note, she does not sleep very well. It is difficult for her to go to sleep and stay asleep. She has significant nocturia up to 5 times per average night, does not know if she snores however. She has been reluctant to try melatonin which was recommended by you, per pt.  She had recent blood work through your office which I reviewed including TSH.  Her Past Medical History Is Significant For: Past Medical History:  Diagnosis Date  . Chronic kidney disease   . Hypertension   . Peripheral vascular disease ([place])   . Vitamin D deficiency disease     Her Past Surgical History Is Significant For: Past Surgical History:  Procedure Laterality Date  . ABDOMINAL HYSTERECTOMY    . HEMORRHOID SURGERY      Her Family History Is Significant For: Family History  Problem Relation Age of Onset  . Hypertension Mother   . Throat cancer Mother   . Prostate cancer Father   . Tremor Father   . Hypertension Sister   . Diabetes Sister   . Hypertension Brother   . Hypertension Sister   . Diabetes Sister   . Hypertension Sister     Her Social History Is Significant For: Social History   Socioeconomic History  . Marital status: Widowed    Spouse name: Not on file  . Number of children: 3  . Years of education: Not on  file  . Highest education level: Not on file  Occupational History  . Occupation: retired  Tobacco Use  . Smoking status: Former Smoker    Packs/day: 1.00    Years: 20.00    Pack years: 20.00    Types: Cigarettes    Quit date: 2004    Years since quitting: 17.3  . Smokeless tobacco: Never Used  Substance and Sexual Activity  . Alcohol use: No  . Drug use: No  . Sexual activity: Not Currently  Other Topics Concern  . Not on file  Social History Narrative  . Not on file   Social Determinants of Health   Financial Resource Strain: Low Risk   . Difficulty of Paying  Living Expenses: Not hard at all  Food Insecurity: No Food Insecurity  . Worried About Charity fundraiser in the Last Year: Never true  . Ran Out of Food in the Last Year: Never true  Transportation Needs: No Transportation Needs  . Lack of Transportation (Medical): No  . Lack of Transportation (Non-Medical): No  Physical Activity: Sufficiently Active  . Days of Exercise per Week: 7 days  . Minutes of Exercise per Session: 30 min  Stress: No Stress Concern Present  . Feeling of Stress : Not at all  Social Connections:   . Frequency of Communication with Friends and Family:   . Frequency of Social Gatherings with Friends and Family:   . Attends Religious Services:   . Active Member of Clubs or Organizations:   . Attends Archivist Meetings:   Marland Kitchen Marital Status:     Her Allergies Are:  Allergies  Allergen Reactions  . Amoxicillin Itching  . Penicillins Hives  . Potassium-Containing Compounds Itching  :   Her Current Medications Are:  Outpatient Encounter Medications as of 04/15/2020  Medication Sig  . Acetaminophen (TYLENOL PO) Take by mouth.  . Ascorbic Acid (VITAMIN C PO) Take by mouth.  . calcium carbonate (TUMS - DOSED IN MG ELEMENTAL CALCIUM) 500 MG chewable tablet Chew 2 tablets by mouth daily.   . Cholecalciferol (VITAMIN D3 PO) Take by mouth.  . fish oil-omega-3 fatty acids 1000 MG  capsule Take 2 g by mouth daily.  . Garlic 8891 MG CAPS Take by mouth.  Marland Kitchen glucosamine-chondroitin 500-400 MG tablet Take 1 tablet by mouth 3 (three) times daily.  Marland Kitchen lisinopril (ZESTRIL) 5 MG tablet Take 1 tablet (5 mg total) by mouth daily.  Marland Kitchen loratadine (CLARITIN) 10 MG tablet Take 10 mg by mouth daily.  . Multiple Vitamin (MULTIVITAMIN) tablet Take 1 tablet by mouth daily.  . Multiple Vitamins-Minerals (PRESERVISION AREDS PO) Take by mouth.  . naproxen (NAPROSYN) 375 MG tablet TAKE 1 TABLET(375 MG) BY MOUTH TWICE DAILY (Patient taking differently: PRN)  . pantoprazole (PROTONIX) 40 MG tablet Take 1 tablet (40 mg total) by mouth daily. 30 minutes before breakfast.  . VITAMIN D PO Take by mouth.  . [DISCONTINUED] traMADol (ULTRAM) 50 MG tablet Take 1 tablet (50 mg total) by mouth every 6 (six) hours as needed. (Patient not taking: Reported on 04/15/2020)   No facility-administered encounter medications on file as of 04/15/2020.  :  Review of Systems:  Out of a complete 14 point review of systems, all are reviewed and negative with the exception of these symptoms as listed below: Review of Systems  Neurological:       Last visit was in aug. Of 2020.  Pt reports she did not complete sleep study because she was afraid of the cost. She reports she does not sleep well at night and wakes with moth and throat dry.  Epworth Sleepiness Scale 0= would never doze 1= slight chance of dozing 2= moderate chance of dozing 3= high chance of dozing  Sitting and reading:1 Watching TV:2 Sitting inactive in a public place (ex. Theater or meeting):0 As a passenger in a car for an hour without a break:0 Lying down to rest in the afternoon:2 Sitting and talking to someone:0 Sitting quietly after lunch (no alcohol):1 In a car, while stopped in traffic:0 Total:6     Objective:  Neurological Exam  Physical Exam Physical Examination:   Vitals:   04/15/20 1404  BP: 130/68  Pulse: 84  Temp: (!) 97.3  F (36.3 C)  SpO2: 94%   General Examination: The patient is a very pleasant 86 y.o. female in no acute distress. She appears well-developed and well-nourished and well groomed.   HEENT:Normocephalic, atraumatic, pupils are equal, round and reactive to light, extraocular tracking is good without limitation to gaze excursion or nystagmus noted. Normal smooth pursuit is noted. Hearing is grossly intact. Face is symmetric with normal facial animation and normal facial sensation. Speech is clear with no dysarthria noted. There is no hypophonia. There is a mild side-to-side head tremor and a moderate voice tremor. Neck is supple, no nuchal rigidity noted.There are no carotid bruits on auscultation. Oropharynx exam reveals: mildmouth dryness, adequatedental hygiene and moderateairway crowding.  Tongue protrudes centrally in palate elevates symmetrically.   Chest:Clear to auscultation without wheezing, rhonchi or crackles noted.  Heart:S1+S2+0, regular and normal without murmurs, rubs or gallops noted.   Abdomen:Soft, non-tender and non-distended with normal bowel sounds appreciated on auscultation.  Extremities:There is1+pitting edema in the distal lower extremities bilaterally.  Skin: Warm and dry without trophic changes noted.  Musculoskeletal: exam reveals pain and swelling R hand, in particular index finger MCP, and middle finder PIP. R knee pain and swelling.   Neurologically:  Mental status: The patient is awake, alert and oriented in all 4 spheres.Herimmediate and remote memory, attention, language skills and fund of knowledge are appropriate. There is no evidence of aphasia, agnosia, apraxia or anomia. Speech is clear with normal prosody and enunciation. Thought process is linear. Mood is normaland affect is normal.  Cranial nerves II - XII are as described above under HEENT exam.   Motor exam: Normal bulk, strength and tone is noted. There is no drift,restingtremor  or rebound.  She has no significant postural or action tremor in the hands. She has no resting tremor in the lower extremities or upper extremities.  Fine motor skills and coordination: grossly intact but decrease grip strength d/t pain in R hand.  cerbellar testing: No dysmetria or intention tremor. Notruncal or gait ataxia.  Sensory exam: intact to light touch in the upper and lower extremities.  Gait, station and balance:she walks with a 4 prong cane.    Assessmentand plan:   In summary,Trinette H Thompsonis a very pleasant 54 year oldfemalewith an underlying medical history of vitamin D deficiency, peripheral vascular disease, hypertension, chronic kidney disease, arthritis, obesity, and essential tremor, affecting her head and voice, who presents for follow-up consultation of her tremor and her sleep disorder.  She has not yet pursued sleep study testing due to concern about insurance coverage.  She is advised that we will look into insurance authorization for sleep testing and we could at least look with a home sleep test so she does not have to come in and stay overnight.  She is agreeable to this approach.  Her tremor in the head is stable, voice tremor slightly worse.  She has been troubled by arthritis affecting her right hand.  She may benefit from seeing a rheumatologist.  She is encouraged to talk to her primary care physician about this.  She is encouraged to consider taking turmeric daily as part of a supplement that may help with inflammation over time.  It is not a quick fix of course.  She is encouraged to take her naproxen up to twice daily for the next 2 to 3 days to see if the pain and inflammation and swelling in the right hand improves.  She is advised to follow-up with  her primary care physician for arthritis and a potential referral to rheumatology.  She is followed by orthopedics for her knee pain.  I plan to see her back after sleep testing.  I answered all her questions  today and she was in agreement.

## 2020-04-20 ENCOUNTER — Other Ambulatory Visit: Payer: Self-pay | Admitting: Internal Medicine

## 2020-04-21 DIAGNOSIS — R293 Abnormal posture: Secondary | ICD-10-CM | POA: Diagnosis not present

## 2020-04-21 DIAGNOSIS — M6281 Muscle weakness (generalized): Secondary | ICD-10-CM | POA: Diagnosis not present

## 2020-04-21 DIAGNOSIS — R262 Difficulty in walking, not elsewhere classified: Secondary | ICD-10-CM | POA: Diagnosis not present

## 2020-04-21 DIAGNOSIS — M545 Low back pain: Secondary | ICD-10-CM | POA: Diagnosis not present

## 2020-04-28 DIAGNOSIS — M6281 Muscle weakness (generalized): Secondary | ICD-10-CM | POA: Diagnosis not present

## 2020-04-28 DIAGNOSIS — R262 Difficulty in walking, not elsewhere classified: Secondary | ICD-10-CM | POA: Diagnosis not present

## 2020-04-28 DIAGNOSIS — R293 Abnormal posture: Secondary | ICD-10-CM | POA: Diagnosis not present

## 2020-04-28 DIAGNOSIS — M545 Low back pain: Secondary | ICD-10-CM | POA: Diagnosis not present

## 2020-04-30 DIAGNOSIS — H353221 Exudative age-related macular degeneration, left eye, with active choroidal neovascularization: Secondary | ICD-10-CM | POA: Diagnosis not present

## 2020-05-02 DIAGNOSIS — M1711 Unilateral primary osteoarthritis, right knee: Secondary | ICD-10-CM | POA: Diagnosis not present

## 2020-05-05 ENCOUNTER — Other Ambulatory Visit: Payer: Self-pay

## 2020-05-05 DIAGNOSIS — M79644 Pain in right finger(s): Secondary | ICD-10-CM

## 2020-05-07 DIAGNOSIS — M545 Low back pain: Secondary | ICD-10-CM | POA: Diagnosis not present

## 2020-05-07 DIAGNOSIS — R262 Difficulty in walking, not elsewhere classified: Secondary | ICD-10-CM | POA: Diagnosis not present

## 2020-05-07 DIAGNOSIS — R293 Abnormal posture: Secondary | ICD-10-CM | POA: Diagnosis not present

## 2020-05-07 DIAGNOSIS — M6281 Muscle weakness (generalized): Secondary | ICD-10-CM | POA: Diagnosis not present

## 2020-05-09 DIAGNOSIS — M1711 Unilateral primary osteoarthritis, right knee: Secondary | ICD-10-CM | POA: Diagnosis not present

## 2020-05-15 DIAGNOSIS — R293 Abnormal posture: Secondary | ICD-10-CM | POA: Diagnosis not present

## 2020-05-15 DIAGNOSIS — R262 Difficulty in walking, not elsewhere classified: Secondary | ICD-10-CM | POA: Diagnosis not present

## 2020-05-15 DIAGNOSIS — M6281 Muscle weakness (generalized): Secondary | ICD-10-CM | POA: Diagnosis not present

## 2020-05-15 DIAGNOSIS — M545 Low back pain: Secondary | ICD-10-CM | POA: Diagnosis not present

## 2020-05-16 DIAGNOSIS — M1711 Unilateral primary osteoarthritis, right knee: Secondary | ICD-10-CM | POA: Diagnosis not present

## 2020-05-21 DIAGNOSIS — M6281 Muscle weakness (generalized): Secondary | ICD-10-CM | POA: Diagnosis not present

## 2020-05-21 DIAGNOSIS — R262 Difficulty in walking, not elsewhere classified: Secondary | ICD-10-CM | POA: Diagnosis not present

## 2020-05-21 DIAGNOSIS — M545 Low back pain: Secondary | ICD-10-CM | POA: Diagnosis not present

## 2020-05-21 DIAGNOSIS — R293 Abnormal posture: Secondary | ICD-10-CM | POA: Diagnosis not present

## 2020-05-21 DIAGNOSIS — M79641 Pain in right hand: Secondary | ICD-10-CM | POA: Diagnosis not present

## 2020-05-21 DIAGNOSIS — M79642 Pain in left hand: Secondary | ICD-10-CM | POA: Diagnosis not present

## 2020-05-22 DIAGNOSIS — M19042 Primary osteoarthritis, left hand: Secondary | ICD-10-CM | POA: Diagnosis not present

## 2020-05-22 DIAGNOSIS — M19041 Primary osteoarthritis, right hand: Secondary | ICD-10-CM | POA: Diagnosis not present

## 2020-05-28 DIAGNOSIS — R262 Difficulty in walking, not elsewhere classified: Secondary | ICD-10-CM | POA: Diagnosis not present

## 2020-05-28 DIAGNOSIS — M545 Low back pain: Secondary | ICD-10-CM | POA: Diagnosis not present

## 2020-05-28 DIAGNOSIS — R293 Abnormal posture: Secondary | ICD-10-CM | POA: Diagnosis not present

## 2020-05-28 DIAGNOSIS — M6281 Muscle weakness (generalized): Secondary | ICD-10-CM | POA: Diagnosis not present

## 2020-05-30 DIAGNOSIS — M19041 Primary osteoarthritis, right hand: Secondary | ICD-10-CM | POA: Diagnosis not present

## 2020-05-30 DIAGNOSIS — M19042 Primary osteoarthritis, left hand: Secondary | ICD-10-CM | POA: Diagnosis not present

## 2020-06-04 DIAGNOSIS — M6281 Muscle weakness (generalized): Secondary | ICD-10-CM | POA: Diagnosis not present

## 2020-06-04 DIAGNOSIS — R262 Difficulty in walking, not elsewhere classified: Secondary | ICD-10-CM | POA: Diagnosis not present

## 2020-06-04 DIAGNOSIS — M545 Low back pain: Secondary | ICD-10-CM | POA: Diagnosis not present

## 2020-06-04 DIAGNOSIS — H353221 Exudative age-related macular degeneration, left eye, with active choroidal neovascularization: Secondary | ICD-10-CM | POA: Diagnosis not present

## 2020-06-04 DIAGNOSIS — R293 Abnormal posture: Secondary | ICD-10-CM | POA: Diagnosis not present

## 2020-06-05 DIAGNOSIS — H02844 Edema of left upper eyelid: Secondary | ICD-10-CM | POA: Diagnosis not present

## 2020-06-05 DIAGNOSIS — H02845 Edema of left lower eyelid: Secondary | ICD-10-CM | POA: Diagnosis not present

## 2020-06-05 DIAGNOSIS — H05222 Edema of left orbit: Secondary | ICD-10-CM | POA: Diagnosis not present

## 2020-06-05 DIAGNOSIS — H5712 Ocular pain, left eye: Secondary | ICD-10-CM | POA: Diagnosis not present

## 2020-06-06 DIAGNOSIS — M19041 Primary osteoarthritis, right hand: Secondary | ICD-10-CM | POA: Diagnosis not present

## 2020-06-06 DIAGNOSIS — M19042 Primary osteoarthritis, left hand: Secondary | ICD-10-CM | POA: Diagnosis not present

## 2020-06-09 ENCOUNTER — Ambulatory Visit (INDEPENDENT_AMBULATORY_CARE_PROVIDER_SITE_OTHER): Payer: Medicare Other | Admitting: Neurology

## 2020-06-09 ENCOUNTER — Other Ambulatory Visit: Payer: Self-pay

## 2020-06-09 DIAGNOSIS — R351 Nocturia: Secondary | ICD-10-CM

## 2020-06-09 DIAGNOSIS — R498 Other voice and resonance disorders: Secondary | ICD-10-CM

## 2020-06-09 DIAGNOSIS — G4733 Obstructive sleep apnea (adult) (pediatric): Secondary | ICD-10-CM | POA: Diagnosis not present

## 2020-06-09 DIAGNOSIS — R0683 Snoring: Secondary | ICD-10-CM

## 2020-06-09 DIAGNOSIS — G250 Essential tremor: Secondary | ICD-10-CM

## 2020-06-09 DIAGNOSIS — M19041 Primary osteoarthritis, right hand: Secondary | ICD-10-CM

## 2020-06-09 DIAGNOSIS — G479 Sleep disorder, unspecified: Secondary | ICD-10-CM

## 2020-06-11 DIAGNOSIS — M6281 Muscle weakness (generalized): Secondary | ICD-10-CM | POA: Diagnosis not present

## 2020-06-11 DIAGNOSIS — R262 Difficulty in walking, not elsewhere classified: Secondary | ICD-10-CM | POA: Diagnosis not present

## 2020-06-11 DIAGNOSIS — M545 Low back pain: Secondary | ICD-10-CM | POA: Diagnosis not present

## 2020-06-11 DIAGNOSIS — R293 Abnormal posture: Secondary | ICD-10-CM | POA: Diagnosis not present

## 2020-06-13 DIAGNOSIS — M19042 Primary osteoarthritis, left hand: Secondary | ICD-10-CM | POA: Diagnosis not present

## 2020-06-13 DIAGNOSIS — M19041 Primary osteoarthritis, right hand: Secondary | ICD-10-CM | POA: Diagnosis not present

## 2020-06-20 DIAGNOSIS — M19042 Primary osteoarthritis, left hand: Secondary | ICD-10-CM | POA: Diagnosis not present

## 2020-06-20 DIAGNOSIS — M19041 Primary osteoarthritis, right hand: Secondary | ICD-10-CM | POA: Diagnosis not present

## 2020-06-23 ENCOUNTER — Telehealth: Payer: Self-pay

## 2020-06-23 NOTE — Addendum Note (Signed)
Addended by: Huston Foley on: 06/23/2020 07:30 AM   Modules accepted: Orders

## 2020-06-23 NOTE — Telephone Encounter (Signed)
I reached out to the pt and we discussed her recent sleep study/ Dr. Teofilo Pod recommendation.  Pt reports she would like to discuss this result with her PCP before agreeing to starting therapy. Pt sts she will call pcp to discuss results and I will follow back up next week to see if she has reached a decision.   Report send to Dr. Allyne Gee.

## 2020-06-23 NOTE — Procedures (Signed)
Patient Information     First Name: Tricia Last Name: Ferguson ID: [id number]  Birth Date: May 19, 1934 Age: 86 Gender: Female  Referring            Dorothyann Peng, MD Provider: BMI: 37.5 (W=198 lb, H=5' 1'')   Epworth:  6/24   Sleep Study Information    Study Date: 06/09/20 S/H/A Version: [id number] / 4.0.1515 / 45  History:    86 year old woman with a history of peripheral vascular disease, hypertension, chronic kidney disease, arthritis, obesity, vitamin D deficiency and essential tremor, particularly head tremor with a family history of tremor, who presents for evaluation of her sleep disorder. She reports sleep disruption and daytime somnolence.  Summary & Diagnosis:     OSA Recommendations:     This home sleep test demonstrates severe obstructive sleep apnea with a total AHI of 54.5/hour and O2 nadir of 83%. Given the patient's medical history and sleep related complaints, treatment with positive airway pressure (in the form of CPAP) is recommended. This will require a full night CPAP titration study for proper treatment settings, O2 monitoring and mask fitting. Based on the severity of the sleep disordered breathing an attended titration study is indicated. However, the patient wants to avoid an overnight sleep study; therefore, the patient will be advised to proceed with an autoPAP titration/trial at home for now. Please note that untreated obstructive sleep apnea may carry additional perioperative morbidity. Patients with significant obstructive sleep apnea should receive perioperative PAP therapy and the surgeons and particularly the anesthesiologist should be informed of the diagnosis and the severity of the sleep disordered breathing. The patient should be cautioned not to drive, work at heights, or operate dangerous or heavy equipment when tired or sleepy. Review and reiteration of good sleep hygiene measures should be pursued with any patient. Other causes of the patient's symptoms,  including circadian rhythm disturbances, an underlying mood disorder, medication effect and/or an underlying medical problem cannot be ruled out based on this test. Clinical correlation is recommended. The patient and her referring provider will be notified of the test results. The patient will be seen in follow up in sleep clinic at [facility].  I certify that I have reviewed the raw data recording prior to the issuance of this report in accordance with the standards of the American Academy of Sleep Medicine (AASM).  Huston Foley, MD, PhD [facility]) Diplomat, ABPN (Neurology and Sleep)           Sleep Summary  Oxygen Saturation Statistics   Start Study Time: End Study Time: Total Recording Time:  9:44:13 PM 6:42:10 AM   8 h, 57 min  Total Sleep Time % REM of Sleep Time:  7 h, 24 min  14.4    Mean: 92 Minimum: 83 Maximum: 97  Mean of Desaturations Nadirs (%):   90  Oxygen Desaturation. %:   4-9 10-20 >20 Total  Events Number Total   212  2 99.1 0.9  0 0.0  214 100.0  Oxygen Saturation: <90 <=88 <85 <80 <70  Duration (minutes): Sleep % 24.8 5.6 14.8 0.2 3.3 0.0 0.0 0.0 0.0 0.0     Respiratory Indices      Total Events REM NREM All Night  pRDI:  399  pAHI:  397 ODI:  214  pAHIc:  2  % CSR: 0.0 70.0 70.0 43.5 0.0 52.2 51.9 27.0 0.7 54.8 54.5 29.4 0.6       Pulse Rate Statistics during Sleep (BPM)  Mean: 75 Minimum: 64 Maximum: 100    Indices are calculated using technically valid sleep time of 7 h, 17 min. Central-Indices are calculated using technically valid sleep time of 3 h, 25 min. pRDI/pAHI are calculated using oxi desaturations ? 3%  Body Position Statistics  Position Supine Prone Right Left Non-Supine  Sleep (min) 23.0 0.0 152.0 43.0 195.0  Sleep % 5.2 0.0 34.2 9.7 43.9  pRDI 65.2 N/A 46.2 89.8 55.7  pAHI 65.2 N/A 46.2 89.8 55.7  ODI 31.3 N/A 17.4 66.6 28.2     Snoring Statistics Snoring Level (dB) >40 >50 >60  >70 >80 >Threshold (45)  Sleep (min) 56.5 4.0 0.7 0.0 0.0 10.2  Sleep % 12.7 0.9 0.2 0.0 0.0 2.3    Mean: 40 dB Sleep Stages Chart                                                                             pAHI=54.5                                                                                              Mild              Moderate                    Severe                                                 5              15                    30

## 2020-06-23 NOTE — Progress Notes (Signed)
Patient referred by Dr. Allyne Gee, last seen by me on 04/15/20, HST on 06/09/20.    Please call and notify the patient that the recent home sleep test showed obstructive sleep apnea in the severe range. While I recommend treatment for this in the form CPAP after a lab attended sleep study, the patient had indicated that she would prefer not to come in for an overnight sleep study. Therefore, I suggest we start treatment at home with an autoPAP machine, which means, that we don't have to bring her in for a sleep study with CPAP, but will let her try an autoPAP machine at home, through a DME company (of her choice, or as per insurance requirement). The DME representative will educate her on how to use the machine, how to put the mask on, etc. I have placed an order in the chart. Please send referral, talk to patient, send report to referring MD. We will need a FU in sleep clinic for 10 weeks post-PAP set up, please arrange that with me or one of our NPs. Thanks,   Huston Foley, MD, PhD [facility])

## 2020-06-23 NOTE — Telephone Encounter (Signed)
Noted, thank you

## 2020-06-23 NOTE — Telephone Encounter (Signed)
-----   Message from Huston Foley, MD sent at 06/23/2020  7:30 AM EDT ----- Patient referred by Dr. Allyne Gee, last seen by me on 04/15/20, HST on 06/09/20.    Please call and notify the patient that the recent home sleep test showed obstructive sleep apnea in the severe range. While I recommend treatment for this in the form CPAP after a lab attended sleep study, the patient had indicated that she would prefer not to come in for an overnight sleep study. Therefore, I suggest we start treatment at home with an autoPAP machine, which means, that we don't have to bring her in for a sleep study with CPAP, but will let her try an autoPAP machine at home, through a DME company (of her choice, or as per insurance requirement). The DME representative will educate her on how to use the machine, how to put the mask on, etc. I have placed an order in the chart. Please send referral, talk to patient, send report to referring MD. We will need a FU in sleep clinic for 10 weeks post-PAP set up, please arrange that with me or one of our NPs. Thanks,   Huston Foley, MD, PhD [facility])

## 2020-06-25 DIAGNOSIS — R262 Difficulty in walking, not elsewhere classified: Secondary | ICD-10-CM | POA: Diagnosis not present

## 2020-06-25 DIAGNOSIS — R293 Abnormal posture: Secondary | ICD-10-CM | POA: Diagnosis not present

## 2020-06-25 DIAGNOSIS — M6281 Muscle weakness (generalized): Secondary | ICD-10-CM | POA: Diagnosis not present

## 2020-06-25 DIAGNOSIS — M545 Low back pain: Secondary | ICD-10-CM | POA: Diagnosis not present

## 2020-07-01 ENCOUNTER — Ambulatory Visit: Payer: Medicare Other | Admitting: Internal Medicine

## 2020-07-01 ENCOUNTER — Ambulatory Visit: Payer: Medicare Other

## 2020-07-01 ENCOUNTER — Other Ambulatory Visit: Payer: Self-pay | Admitting: Internal Medicine

## 2020-07-04 DIAGNOSIS — M25551 Pain in right hip: Secondary | ICD-10-CM | POA: Diagnosis not present

## 2020-07-04 DIAGNOSIS — M1711 Unilateral primary osteoarthritis, right knee: Secondary | ICD-10-CM | POA: Diagnosis not present

## 2020-07-04 DIAGNOSIS — M1611 Unilateral primary osteoarthritis, right hip: Secondary | ICD-10-CM | POA: Diagnosis not present

## 2020-07-16 ENCOUNTER — Ambulatory Visit (INDEPENDENT_AMBULATORY_CARE_PROVIDER_SITE_OTHER): Payer: Medicare Other

## 2020-07-16 ENCOUNTER — Ambulatory Visit (INDEPENDENT_AMBULATORY_CARE_PROVIDER_SITE_OTHER): Payer: Medicare Other | Admitting: Internal Medicine

## 2020-07-16 ENCOUNTER — Encounter: Payer: Self-pay | Admitting: Internal Medicine

## 2020-07-16 ENCOUNTER — Other Ambulatory Visit: Payer: Self-pay

## 2020-07-16 VITALS — BP 142/78 | HR 76 | Temp 98.2°F | Ht 60.4 in | Wt 191.6 lb

## 2020-07-16 VITALS — BP 142/78 | HR 76 | Temp 98.2°F | Ht 60.4 in | Wt 191.0 lb

## 2020-07-16 DIAGNOSIS — Z1159 Encounter for screening for other viral diseases: Secondary | ICD-10-CM | POA: Diagnosis not present

## 2020-07-16 DIAGNOSIS — M5416 Radiculopathy, lumbar region: Secondary | ICD-10-CM

## 2020-07-16 DIAGNOSIS — Z0000 Encounter for general adult medical examination without abnormal findings: Secondary | ICD-10-CM

## 2020-07-16 DIAGNOSIS — Z6836 Body mass index (BMI) 36.0-36.9, adult: Secondary | ICD-10-CM

## 2020-07-16 DIAGNOSIS — M25552 Pain in left hip: Secondary | ICD-10-CM

## 2020-07-16 DIAGNOSIS — R7309 Other abnormal glucose: Secondary | ICD-10-CM

## 2020-07-16 DIAGNOSIS — I10 Essential (primary) hypertension: Secondary | ICD-10-CM

## 2020-07-16 DIAGNOSIS — Z23 Encounter for immunization: Secondary | ICD-10-CM

## 2020-07-16 DIAGNOSIS — M25551 Pain in right hip: Secondary | ICD-10-CM | POA: Diagnosis not present

## 2020-07-16 DIAGNOSIS — H353221 Exudative age-related macular degeneration, left eye, with active choroidal neovascularization: Secondary | ICD-10-CM | POA: Diagnosis not present

## 2020-07-16 LAB — POCT UA - MICROALBUMIN
Albumin/Creatinine Ratio, Urine, POC: <30
Creatinine, POC: 100 mg/dL
Microalbumin Ur, POC: 10 mg/L

## 2020-07-16 MED ORDER — SHINGRIX 50 MCG/0.5ML IM SUSR: 0.5000 mL | Freq: Once | INTRAMUSCULAR | 0 refills | Status: AC

## 2020-07-16 NOTE — Progress Notes (Signed)
I,Katawbba Wiggins,acting as a Education administrator for [name], MD.,have documented all relevant documentation on the behalf of [name], MD,as directed by  [name], MD while in the presence of [name], MD.  This visit occurred during the SARS-CoV-2 public health emergency.  Safety protocols were in place, including screening questions prior to the visit, additional usage of staff PPE, and extensive cleaning of exam room while observing appropriate contact time as indicated for disinfecting solutions.  Subjective:     Patient ID: Tricia Ferguson , female    DOB: 12/27/1933 , 86 y.o.   MRN: [id number]   Chief Complaint  Patient presents with  . Annual Exam  . Hypertension    HPI  She is here today for a full physical examination. She is no longer seen by GYN. She has no new concerns or complaints at this time.   Hypertension This is a chronic problem. The current episode started more than 1 year ago. The problem has been gradually improving since onset. The problem is controlled. Pertinent negatives include no blurred vision, chest pain or shortness of breath. Risk factors for coronary artery disease include obesity, post-menopausal state and sedentary lifestyle. Past treatments include ACE inhibitors. The current treatment provides moderate improvement. Compliance problems include exercise.      Past Medical History:  Diagnosis Date  . Chronic kidney disease   . Hypertension   . Peripheral vascular disease ([place])   . Vitamin D deficiency disease      Family History  Problem Relation Age of Onset  . Hypertension Mother   . Throat cancer Mother   . Prostate cancer Father   . Tremor Father   . Hypertension Sister   . Diabetes Sister   . Hypertension Brother   . Hypertension Sister   . Diabetes Sister   . Hypertension Sister      Current Outpatient Medications:  .  Acetaminophen (TYLENOL PO), Take by mouth., Disp: , Rfl:  .  Ascorbic Acid  (VITAMIN C PO), Take by mouth., Disp: , Rfl:  .  calcium carbonate (TUMS - DOSED IN MG ELEMENTAL CALCIUM) 500 MG chewable tablet, Chew 2 tablets by mouth daily. , Disp: , Rfl:  .  Cholecalciferol (VITAMIN D3 PO), Take 2,000 Units by mouth daily. , Disp: , Rfl:  .  fish oil-omega-3 fatty acids 1000 MG capsule, Take 2 g by mouth daily., Disp: , Rfl:  .  Garlic 7341 MG CAPS, Take by mouth., Disp: , Rfl:  .  glucosamine-chondroitin 500-400 MG tablet, Take 1 tablet by mouth 3 (three) times daily., Disp: , Rfl:  .  lisinopril (ZESTRIL) 5 MG tablet, TAKE 1 TABLET(5 MG) BY MOUTH DAILY, Disp: 90 tablet, Rfl: 1 .  loratadine (CLARITIN) 10 MG tablet, Take 10 mg by mouth daily., Disp: , Rfl:  .  Multiple Vitamin (MULTIVITAMIN) tablet, Take 1 tablet by mouth daily., Disp: , Rfl:  .  Multiple Vitamins-Minerals (PRESERVISION AREDS PO), Take by mouth., Disp: , Rfl:  .  pantoprazole (PROTONIX) 40 MG tablet, Take 1 tablet (40 mg total) by mouth daily. 30 minutes before breakfast., Disp: 30 tablet, Rfl: 11 .  naproxen (NAPROSYN) 375 MG tablet, TAKE 1 TABLET(375 MG) BY MOUTH TWICE DAILY (Patient not taking: Reported on 07/15/2020), Disp: 30 tablet, Rfl: 0   Allergies  Allergen Reactions  . Amoxicillin Itching  . Penicillins Hives  . Potassium-Containing Compounds Itching      The patient states she uses post menopausal status for  birth control. Last LMP was No LMP recorded. Patient has had a hysterectomy.. Negative for Dysmenorrhea. Negative for: breast discharge, breast lump(s), breast pain and breast self exam. Associated symptoms include abnormal vaginal bleeding. Pertinent negatives include abnormal bleeding (hematology), anxiety, decreased libido, depression, difficulty falling sleep, dyspareunia, history of infertility, nocturia, sexual dysfunction, sleep disturbances, urinary incontinence, urinary urgency, vaginal discharge and vaginal itching. Diet regular.The patient states her exercise level is   intermittent.   . The patient's tobacco use is:  Social History   Tobacco Use  Smoking Status Former Smoker  . Packs/day: 1.00  . Years: 20.00  . Pack years: 20.00  . Types: Cigarettes  . Quit date: 2004  . Years since quitting: 17.6  Smokeless Tobacco Never Used  Tobacco Comment   n/a  . She has been exposed to passive smoke. The patient's alcohol use is:  Social History   Substance and Sexual Activity  Alcohol Use No   Review of Systems  Constitutional: Negative.   HENT: Negative.   Eyes: Negative.  Negative for blurred vision.  Respiratory: Negative.  Negative for shortness of breath.   Cardiovascular: Negative.  Negative for chest pain.  Gastrointestinal: Negative.   Endocrine: Negative.   Genitourinary: Negative.   Musculoskeletal: Positive for arthralgias.       She c/o b/l knee, hip and back pain. She is followed by Dr. Theda Sers. She feels her back pain is worsening. Has pain going down right leg. Has been to PT, no relief in her sx. Back pain also worsened with standing/walking.   Skin: Negative.   Allergic/Immunologic: Negative.   Neurological: Negative.   Hematological: Negative.   Psychiatric/Behavioral: Negative.      Today's Vitals   07/16/20 0951  BP: (!) 142/78  Pulse: 76  Temp: 98.2 F (36.8 C)  TempSrc: Oral  Weight: 191 lb 9.6 oz (86.9 kg)  Height: 5' 0.4" (1.534 m)  PainSc: 6   PainLoc: Hip   Body mass index is 36.93 kg/m.  Wt Readings from Last 3 Encounters:  07/16/20 191 lb 9.6 oz (86.9 kg)  07/16/20 191 lb (86.6 kg)  04/15/20 198 lb 5 oz (90 kg)   Objective:  Physical Exam Constitutional:      General: She is not in acute distress.    Appearance: Normal appearance. She is well-developed. She is obese.  HENT:     Head: Normocephalic and atraumatic.     Right Ear: Hearing, tympanic membrane, ear canal and external ear normal. There is no impacted cerumen.     Left Ear: Hearing, tympanic membrane, ear canal and external ear normal.  There is no impacted cerumen.     Nose:     Comments: Deferred, masked    Mouth/Throat:     Comments: Deferred, masked Eyes:     General: Lids are normal.     Extraocular Movements: Extraocular movements intact.     Conjunctiva/sclera: Conjunctivae normal.     Pupils: Pupils are equal, round, and reactive to light.     Funduscopic exam:    Right eye: No papilledema.        Left eye: No papilledema.  Neck:     Thyroid: No thyroid mass.     Vascular: No carotid bruit.  Cardiovascular:     Rate and Rhythm: Normal rate and regular rhythm.     Pulses: Normal pulses.     Heart sounds: Normal heart sounds. No murmur heard.   Pulmonary:     Effort: Pulmonary effort is  normal.     Breath sounds: Normal breath sounds.  Chest:     Breasts: Tanner Score is 5.        Right: Normal.        Left: Normal.  Abdominal:     General: Bowel sounds are normal. There is no distension.     Palpations: Abdomen is soft.     Tenderness: There is no abdominal tenderness.     Comments: Obese, soft  Genitourinary:    Rectum: Guaiac result negative.  Musculoskeletal:        General: No swelling. Normal range of motion.     Cervical back: Full passive range of motion without pain, normal range of motion and neck supple.     Right lower leg: No edema.     Left lower leg: No edema.     Comments: Neg straight leg test b/l  Skin:    General: Skin is warm and dry.     Capillary Refill: Capillary refill takes less than 2 seconds.  Neurological:     General: No focal deficit present.     Mental Status: She is alert and oriented to person, place, and time.     Cranial Nerves: No cranial nerve deficit.     Sensory: No sensory deficit.  Psychiatric:        Mood and Affect: Mood normal.        Behavior: Behavior normal.        Thought Content: Thought content normal.        Judgment: Judgment normal.         Assessment And Plan:     1. Routine general medical examination at a health care  facility Comments: A full exam was performed. Importance of monthly self breast exams was discussed with the patient. PATIENT IS ADVISED TO GET 30-45 MINUTES REGULAR EXERCISE NO LESS THAN FOUR TO FIVE DAYS PER WEEK - BOTH WEIGHTBEARING EXERCISES AND AEROBIC ARE RECOMMENDED.  PATIENT IS ADVISED TO FOLLOW A HEALTHY DIET WITH AT LEAST SIX FRUITS/VEGGIES PER DAY, DECREASE INTAKE OF RED MEAT, AND TO INCREASE FISH INTAKE TO TWO DAYS PER WEEK.  MEATS/FISH SHOULD NOT BE FRIED, BAKED OR BROILED IS PREFERABLE.  I SUGGEST WEARING SPF 50 SUNSCREEN ON EXPOSED PARTS AND ESPECIALLY WHEN IN THE DIRECT SUNLIGHT FOR AN EXTENDED PERIOD OF TIME.  PLEASE AVOID FAST FOOD RESTAURANTS AND INCREASE YOUR WATER INTAKE.   2. Essential hypertension, benign Comments: Chronic, fair control. States BP Encouraged to avoid adding salt to her foods. EKG performed, NSR w/ LVH and QRS widening. She will continue with current meds. She will rto in six months for re-evaluation.   - EKG 12-Lead - CBC - CMP14+EGFR  3. Lumbar radiculopathy, chronic Comments: Chronic, she has failed PT. I will refer her for MRI lumbar spine. I will also refer her to spine specialist if needed, once her results are avail for review.  - MR Lumbar Spine Wo Contrast; Future  4. Bilateral hip pain Comments: Chronic, she has OA. Currently under the care of Ortho.  5. Other abnormal glucose  HER A1C HAS BEEN ELEVATED IN THE PAST. I WILL CHECK AN A1C, BMET TODAY. SHE WAS ENCOURAGED TO AVOID SUGARY BEVERAGES AND PROCESSED FOODS INCLUDNG BREADS, RICE AND PASTA.  - Hemoglobin A1c  6. Encounter for HCV screening test for low risk patient Comments: I will check HCV ab today.  - Hepatitis C antibody  7. Class 2 severe obesity due to excess calories with serious comorbidity and  body mass index (BMI) of 36.0 to 36.9 in adult [facility])  She is encouraged to strive for BMI less than 30 to decrease cardiac risk. Advised to aim for at least 150 minutes of exercise per  week.  8. Immunization due  Rx Shingrix was sent to her local pharmacy. She is encouraged to get within 1-2 weeks.    Patient was given opportunity to ask questions. Patient verbalized understanding of the plan and was able to repeat key elements of the plan. All questions were answered to their satisfaction.   [name], MD   I, [name], MD, have reviewed all documentation for this visit. The documentation on 07/26/20 for the exam, diagnosis, procedures, and orders are all accurate and complete.  THE PATIENT IS ENCOURAGED TO PRACTICE SOCIAL DISTANCING DUE TO THE COVID-19 PANDEMIC.

## 2020-07-16 NOTE — Patient Instructions (Addendum)
Lidocaine patches - use for back pain   Health Maintenance After Age 84 After age 16, you are at a higher risk for certain long-term diseases and infections as well as injuries from falls. Falls are a major cause of broken bones and head injuries in people who are older than age 59. Getting regular preventive care can help to keep you healthy and well. Preventive care includes getting regular testing and making lifestyle changes as recommended by your health care provider. Talk with your health care provider about:  Which screenings and tests you should have. A screening is a test that checks for a disease when you have no symptoms.  A diet and exercise plan that is right for you. What should I know about screenings and tests to prevent falls? Screening and testing are the best ways to find a health problem early. Early diagnosis and treatment give you the best chance of managing medical conditions that are common after age 25. Certain conditions and lifestyle choices may make you more likely to have a fall. Your health care provider may recommend:  Regular vision checks. Poor vision and conditions such as cataracts can make you more likely to have a fall. If you wear glasses, make sure to get your prescription updated if your vision changes.  Medicine review. Work with your health care provider to regularly review all of the medicines you are taking, including over-the-counter medicines. Ask your health care provider about any side effects that may make you more likely to have a fall. Tell your health care provider if any medicines that you take make you feel dizzy or sleepy.  Osteoporosis screening. Osteoporosis is a condition that causes the bones to get weaker. This can make the bones weak and cause them to break more easily.  Blood pressure screening. Blood pressure changes and medicines to control blood pressure can make you feel dizzy.  Strength and balance checks. Your health care  provider may recommend certain tests to check your strength and balance while standing, walking, or changing positions.  Foot health exam. Foot pain and numbness, as well as not wearing proper footwear, can make you more likely to have a fall.  Depression screening. You may be more likely to have a fall if you have a fear of falling, feel emotionally low, or feel unable to do activities that you used to do.  Alcohol use screening. Using too much alcohol can affect your balance and may make you more likely to have a fall. What actions can I take to lower my risk of falls? General instructions  Talk with your health care provider about your risks for falling. Tell your health care provider if: ? You fall. Be sure to tell your health care provider about all falls, even ones that seem minor. ? You feel dizzy, sleepy, or off-balance.  Take over-the-counter and prescription medicines only as told by your health care provider. These include any supplements.  Eat a healthy diet and maintain a healthy weight. A healthy diet includes low-fat dairy products, low-fat (lean) meats, and fiber from whole grains, beans, and lots of fruits and vegetables. Home safety  Remove any tripping hazards, such as rugs, cords, and clutter.  Install safety equipment such as grab bars in bathrooms and safety rails on stairs.  Keep rooms and walkways well-lit. Activity   Follow a regular exercise program to stay fit. This will help you maintain your balance. Ask your health care provider what types of exercise are appropriate  for you.  If you need a cane or walker, use it as recommended by your health care provider.  Wear supportive shoes that have nonskid soles. Lifestyle  Do not drink alcohol if your health care provider tells you not to drink.  If you drink alcohol, limit how much you have: ? 0-1 drink a day for women. ? 0-2 drinks a day for men.  Be aware of how much alcohol is in your drink. In the  [place] one drink equals one typical bottle of beer (12 oz), one-half glass of wine (5 oz), or one shot of hard liquor (1 oz).  Do not use any products that contain nicotine or tobacco, such as cigarettes and e-cigarettes. If you need help quitting, ask your health care provider. Summary  Having a healthy lifestyle and getting preventive care can help to protect your health and wellness after age 37.  Screening and testing are the best way to find a health problem early and help you avoid having a fall. Early diagnosis and treatment give you the best chance for managing medical conditions that are more common for people who are older than age 43.  Falls are a major cause of broken bones and head injuries in people who are older than age 61. Take precautions to prevent a fall at home.  Work with your health care provider to learn what changes you can make to improve your health and wellness and to prevent falls. This information is not intended to replace advice given to you by your health care provider. Make sure you discuss any questions you have with your health care provider. Document Revised: 03/22/2019 Document Reviewed: 10/12/2017 Elsevier Patient Education  2020 [employer].

## 2020-07-16 NOTE — Patient Instructions (Signed)
[name] , Thank you for taking time to come for your Medicare Wellness Visit. I appreciate your ongoing commitment to your health goals. Please review the following plan we discussed and let me know if I can assist you in the future.   Screening recommendations/referrals: Colonoscopy: not required Mammogram: completed 03/24/2020 Bone Density: completed 07/24/2019 Recommended yearly ophthalmology/optometry visit for glaucoma screening and checkup Recommended yearly dental visit for hygiene and checkup  Vaccinations: Influenza vaccine: due Pneumococcal vaccine: completed 04/27/2017 Tdap vaccine: completed 06/27/2019 Shingles vaccine: discussed   Covid-19:02/09/2020, 01/19/2020  Advanced directives: Advance directive discussed with you today. Even though you declined this today please call our office should you change your mind and we can give you the proper paperwork for you to fill out.   Conditions/risks identified: none  Next appointment: Follow up in one year for your annual wellness visit 07/22/2021 at 9:45   Preventive Care 65 Years and Older, Female Preventive care refers to lifestyle choices and visits with your health care provider that can promote health and wellness. What does preventive care include?  A yearly physical exam. This is also called an annual well check.  Dental exams once or twice a year.  Routine eye exams. Ask your health care provider how often you should have your eyes checked.  Personal lifestyle choices, including:  Daily care of your teeth and gums.  Regular physical activity.  Eating a healthy diet.  Avoiding tobacco and drug use.  Limiting alcohol use.  Practicing safe sex.  Taking low-dose aspirin every day.  Taking vitamin and mineral supplements as recommended by your health care provider. What happens during an annual well check? The services and screenings done by your health care provider during your annual well check will depend  on your age, overall health, lifestyle risk factors, and family history of disease. Counseling  Your health care provider may ask you questions about your:  Alcohol use.  Tobacco use.  Drug use.  Emotional well-being.  Home and relationship well-being.  Sexual activity.  Eating habits.  History of falls.  Memory and ability to understand (cognition).  Work and work Astronomer.  Reproductive health. Screening  You may have the following tests or measurements:  Height, weight, and BMI.  Blood pressure.  Lipid and cholesterol levels. These may be checked every 5 years, or more frequently if you are over 83 years old.  Skin check.  Lung cancer screening. You may have this screening every year starting at age 51 if you have a 30-pack-year history of smoking and currently smoke or have quit within the past 15 years.  Fecal occult blood test (FOBT) of the stool. You may have this test every year starting at age 39.  Flexible sigmoidoscopy or colonoscopy. You may have a sigmoidoscopy every 5 years or a colonoscopy every 10 years starting at age 35.  Hepatitis C blood test.  Hepatitis B blood test.  Sexually transmitted disease (STD) testing.  Diabetes screening. This is done by checking your blood sugar (glucose) after you have not eaten for a while (fasting). You may have this done every 1-3 years.  Bone density scan. This is done to screen for osteoporosis. You may have this done starting at age 1.  Mammogram. This may be done every 1-2 years. Talk to your health care provider about how often you should have regular mammograms. Talk with your health care provider about your test results, treatment options, and if necessary, the need for more tests. Vaccines  Your health care provider may recommend certain vaccines, such as:  Influenza vaccine. This is recommended every year.  Tetanus, diphtheria, and acellular pertussis (Tdap, Td) vaccine. You may need a Td  booster every 10 years.  Zoster vaccine. You may need this after age 61.  Pneumococcal 13-valent conjugate (PCV13) vaccine. One dose is recommended after age 84.  Pneumococcal polysaccharide (PPSV23) vaccine. One dose is recommended after age 71. Talk to your health care provider about which screenings and vaccines you need and how often you need them. This information is not intended to replace advice given to you by your health care provider. Make sure you discuss any questions you have with your health care provider. Document Released: 12/26/2015 Document Revised: 08/18/2016 Document Reviewed: 09/30/2015 Elsevier Interactive Patient Education  2017 [place] Prevention in the Home Falls can cause injuries. They can happen to people of all ages. There are many things you can do to make your home safe and to help prevent falls. What can I do on the outside of my home?  Regularly fix the edges of walkways and driveways and fix any cracks.  Remove anything that might make you trip as you walk through a door, such as a raised step or threshold.  Trim any bushes or trees on the path to your home.  Use bright outdoor lighting.  Clear any walking paths of anything that might make someone trip, such as rocks or tools.  Regularly check to see if handrails are loose or broken. Make sure that both sides of any steps have handrails.  Any raised decks and porches should have guardrails on the edges.  Have any leaves, snow, or ice cleared regularly.  Use sand or salt on walking paths during winter.  Clean up any spills in your garage right away. This includes oil or grease spills. What can I do in the bathroom?  Use night lights.  Install grab bars by the toilet and in the tub and shower. Do not use towel bars as grab bars.  Use non-skid mats or decals in the tub or shower.  If you need to sit down in the shower, use a plastic, non-slip stool.  Keep the floor dry. Clean up  any water that spills on the floor as soon as it happens.  Remove soap buildup in the tub or shower regularly.  Attach bath mats securely with double-sided non-slip rug tape.  Do not have throw rugs and other things on the floor that can make you trip. What can I do in the bedroom?  Use night lights.  Make sure that you have a light by your bed that is easy to reach.  Do not use any sheets or blankets that are too big for your bed. They should not hang down onto the floor.  Have a firm chair that has side arms. You can use this for support while you get dressed.  Do not have throw rugs and other things on the floor that can make you trip. What can I do in the kitchen?  Clean up any spills right away.  Avoid walking on wet floors.  Keep items that you use a lot in easy-to-reach places.  If you need to reach something above you, use a strong step stool that has a grab bar.  Keep electrical cords out of the way.  Do not use floor polish or wax that makes floors slippery. If you must use wax, use non-skid floor wax.  Do  not have throw rugs and other things on the floor that can make you trip. What can I do with my stairs?  Do not leave any items on the stairs.  Make sure that there are handrails on both sides of the stairs and use them. Fix handrails that are broken or loose. Make sure that handrails are as long as the stairways.  Check any carpeting to make sure that it is firmly attached to the stairs. Fix any carpet that is loose or worn.  Avoid having throw rugs at the top or bottom of the stairs. If you do have throw rugs, attach them to the floor with carpet tape.  Make sure that you have a light switch at the top of the stairs and the bottom of the stairs. If you do not have them, ask someone to add them for you. What else can I do to help prevent falls?  Wear shoes that:  Do not have high heels.  Have rubber bottoms.  Are comfortable and fit you well.  Are  closed at the toe. Do not wear sandals.  If you use a stepladder:  Make sure that it is fully opened. Do not climb a closed stepladder.  Make sure that both sides of the stepladder are locked into place.  Ask someone to hold it for you, if possible.  Clearly mark and make sure that you can see:  Any grab bars or handrails.  First and last steps.  Where the edge of each step is.  Use tools that help you move around (mobility aids) if they are needed. These include:  Canes.  Walkers.  Scooters.  Crutches.  Turn on the lights when you go into a dark area. Replace any light bulbs as soon as they burn out.  Set up your furniture so you have a clear path. Avoid moving your furniture around.  If any of your floors are uneven, fix them.  If there are any pets around you, be aware of where they are.  Review your medicines with your doctor. Some medicines can make you feel dizzy. This can increase your chance of falling. Ask your doctor what other things that you can do to help prevent falls. This information is not intended to replace advice given to you by your health care provider. Make sure you discuss any questions you have with your health care provider. Document Released: 09/25/2009 Document Revised: 05/06/2016 Document Reviewed: 01/03/2015 Elsevier Interactive Patient Education  2017 [employer].

## 2020-07-16 NOTE — Progress Notes (Signed)
This visit occurred during the SARS-CoV-2 public health emergency.  Safety protocols were in place, including screening questions prior to the visit, additional usage of staff PPE, and extensive cleaning of exam room while observing appropriate contact time as indicated for disinfecting solutions.  Subjective:   Tricia Ferguson is a 86 y.o. female who presents for Medicare Annual (Subsequent) preventive examination.  Review of Systems     Cardiac Risk Factors include: hypertension;obesity (BMI >30kg/m2);sedentary lifestyle     Objective:    Today's Vitals   07/16/20 0927 07/16/20 0936 07/16/20 0953  BP: (!) 160/82  (!) 142/78  Pulse: 76    Temp: 98.2 F (36.8 C)    TempSrc: Oral    SpO2: 95%    Weight: 191 lb (86.6 kg)    Height: 5' 0.4" (1.534 m)    PainSc:  6     Body mass index is 36.81 kg/m.  Advanced Directives 07/16/2020 06/27/2019 09/29/2017  Does Patient Have a Medical Advance Directive? No No No  Would patient like information on creating a medical advance directive? No - Patient declined Yes (MAU/Ambulatory/Procedural Areas - Information given) -    Current Medications (verified) Outpatient Encounter Medications as of 07/16/2020  Medication Sig  . Acetaminophen (TYLENOL PO) Take by mouth.  . Ascorbic Acid (VITAMIN C PO) Take by mouth.  . calcium carbonate (TUMS - DOSED IN MG ELEMENTAL CALCIUM) 500 MG chewable tablet Chew 2 tablets by mouth daily.   . Cholecalciferol (VITAMIN D3 PO) Take 2,000 Units by mouth daily.   . fish oil-omega-3 fatty acids 1000 MG capsule Take 2 g by mouth daily.  . Garlic 1000 MG CAPS Take by mouth.  Marland Kitchen glucosamine-chondroitin 500-400 MG tablet Take 1 tablet by mouth 3 (three) times daily.  Marland Kitchen lisinopril (ZESTRIL) 5 MG tablet TAKE 1 TABLET(5 MG) BY MOUTH DAILY  . loratadine (CLARITIN) 10 MG tablet Take 10 mg by mouth daily.  . Multiple Vitamin (MULTIVITAMIN) tablet Take 1 tablet by mouth daily.  . Multiple Vitamins-Minerals  (PRESERVISION AREDS PO) Take by mouth.  . pantoprazole (PROTONIX) 40 MG tablet Take 1 tablet (40 mg total) by mouth daily. 30 minutes before breakfast.  . naproxen (NAPROSYN) 375 MG tablet TAKE 1 TABLET(375 MG) BY MOUTH TWICE DAILY (Patient not taking: Reported on 07/15/2020)   No facility-administered encounter medications on file as of 07/16/2020.    Allergies (verified) Amoxicillin, Penicillins, and Potassium-containing compounds   History: Past Medical History:  Diagnosis Date  . Chronic kidney disease   . Hypertension   . Peripheral vascular disease (HCC)   . Vitamin D deficiency disease    Past Surgical History:  Procedure Laterality Date  . ABDOMINAL HYSTERECTOMY    . HEMORRHOID SURGERY     Family History  Problem Relation Age of Onset  . Hypertension Mother   . Throat cancer Mother   . Prostate cancer Father   . Tremor Father   . Hypertension Sister   . Diabetes Sister   . Hypertension Brother   . Hypertension Sister   . Diabetes Sister   . Hypertension Sister    Social History   Socioeconomic History  . Marital status: Widowed    Spouse name: Not on file  . Number of children: 3  . Years of education: Not on file  . Highest education level: Not on file  Occupational History  . Occupation: retired  Tobacco Use  . Smoking status: Former Smoker    Packs/day: 1.00    Years: 20.00  Pack years: 20.00    Types: Cigarettes    Quit date: 2004    Years since quitting: 17.6  . Smokeless tobacco: Never Used  Vaping Use  . Vaping Use: Never used  Substance and Sexual Activity  . Alcohol use: No  . Drug use: No  . Sexual activity: Not Currently  Other Topics Concern  . Not on file  Social History Narrative  . Not on file   Social Determinants of Health   Financial Resource Strain: Low Risk   . Difficulty of Paying Living Expenses: Not hard at all  Food Insecurity: No Food Insecurity  . Worried About Programme researcher, broadcasting/film/video in the Last Year: Never true  .  Ran Out of Food in the Last Year: Never true  Transportation Needs: No Transportation Needs  . Lack of Transportation (Medical): No  . Lack of Transportation (Non-Medical): No  Physical Activity: Inactive  . Days of Exercise per Week: 0 days  . Minutes of Exercise per Session: 0 min  Stress: No Stress Concern Present  . Feeling of Stress : Not at all  Social Connections:   . Frequency of Communication with Friends and Family:   . Frequency of Social Gatherings with Friends and Family:   . Attends Religious Services:   . Active Member of Clubs or Organizations:   . Attends Banker Meetings:   Marland Kitchen Marital Status:     Tobacco Counseling Counseling given: Not Answered   Clinical Intake:  Pre-visit preparation completed: Yes  Pain : 0-10 Pain Score: 6  Pain Type: Chronic pain Pain Location: Hip Pain Orientation: Right Pain Radiating Towards: leg Pain Descriptors / Indicators: Aching Pain Onset: More than a month ago Pain Frequency: Intermittent Pain Relieving Factors: rest helps  Pain Relieving Factors: rest helps  Nutritional Status: BMI > 30  Obese Nutritional Risks: None Diabetes: No  How often do you need to have someone help you when you read instructions, pamphlets, or other written materials from your doctor or pharmacy?: 1 - Never What is the last grade level you completed in school?: 12th grade  Diabetic? no  Interpreter Needed?: No  Information entered by :: NAllen LPN   Activities of Daily Living In your present state of health, do you have any difficulty performing the following activities: 07/16/2020 07/15/2020  Hearing? N N  Vision? N N  Difficulty concentrating or making decisions? N N  Walking or climbing stairs? Y N  Comment due to hip -  Dressing or bathing? N N  Doing errands, shopping? N N  Preparing Food and eating ? N -  Using the Toilet? N -  In the past six months, have you accidently leaked urine? Y -  Do you have problems  with loss of bowel control? N -  Managing your Medications? N -  Managing your Finances? N -  Housekeeping or managing your Housekeeping? N -  Some recent data might be hidden    Patient Care Team: Dorothyann Peng, MD as PCP - General (Internal Medicine) Janet Berlin, MD as Consulting Physician (Ophthalmology)  Indicate any recent Medical Services you may have received from other than [facility] providers in the past year (date may be approximate).     Assessment:   This is a routine wellness examination for Tricia Ferguson.  Hearing/Vision screen  Hearing Screening   125Hz  250Hz  500Hz  1000Hz  2000Hz  3000Hz  4000Hz  6000Hz  8000Hz   Right ear:           Left ear:  Vision Screening Comments: Regular eye exams,   Dietary issues and exercise activities discussed: Current Exercise Habits: The patient does not participate in regular exercise at present  Goals    . Patient Stated     Stay healthy    . Patient Stated     07/16/2020, eat healthier, exercising and drink more water      Depression Screen PHQ 2/9 Scores 07/16/2020 07/15/2020 02/13/2020 09/06/2019 06/27/2019 01/04/2019 10/06/2018  PHQ - 2 Score 0 0 1 0 0 0 0  PHQ- 9 Score 1 - - - 1 - -    Fall Risk Fall Risk  07/16/2020 02/13/2020 09/06/2019 06/27/2019 01/04/2019  Falls in the past year? 0 0 0 1 1  Number falls in past yr: - - - 0 0  Comment - - - tripped -  Injury with Fall? - - - 0 0  Risk for fall due to : Impaired balance/gait;Medication side effect - - History of fall(s);Medication side effect -  Follow up Falls evaluation completed;Education provided;Falls prevention discussed - - Falls evaluation completed;Education provided;Falls prevention discussed -    Any stairs in or around the home? Yes  If so, are there any without handrails? Yes  Home free of loose throw rugs in walkways, pet beds, electrical cords, etc? Yes  Adequate lighting in your home to reduce risk of falls? Yes   ASSISTIVE DEVICES UTILIZED TO PREVENT  FALLS:  Life alert? No  Use of a cane, walker or w/c? Yes  Grab bars in the bathroom? No  Shower chair or bench in shower? No  Elevated toilet seat or a handicapped toilet? Yes   TIMED UP AND GO:  Was the test performed? No .    Gait slow and steady with assistive device  Cognitive Function:     6CIT Screen 07/16/2020 06/27/2019  What Year? 0 points 0 points  What month? 0 points 0 points  What time? 0 points 0 points  Count back from 20 0 points 0 points  Months in reverse 0 points 0 points  Repeat phrase 0 points 0 points  Total Score 0 0    Immunizations Immunization History  Administered Date(s) Administered  . Influenza, High Dose Seasonal PF 09/14/2014, 09/09/2015, 09/13/2016, 09/14/2017  . Influenza-Unspecified 08/28/2018  . PFIZER SARS-COV-2 Vaccination 01/19/2020, 02/09/2020  . Pneumococcal Conjugate-13 05/16/2015  . Tdap 06/27/2019    TDAP status: Up to date Flu Vaccine status: Up to date Pneumococcal vaccine status: Up to date Covid-19 vaccine status: Completed vaccines  Qualifies for Shingles Vaccine? Yes   Zostavax completed No   Shingrix Completed?: No.    Education has been provided regarding the importance of this vaccine. Patient has been advised to call insurance company to determine out of pocket expense if they have not yet received this vaccine. Advised may also receive vaccine at local pharmacy or Health Dept. Verbalized acceptance and understanding.  Screening Tests Health Maintenance  Topic Date Due  . INFLUENZA VACCINE  07/13/2020  . TETANUS/TDAP  06/26/2029  . DEXA SCAN  Completed  . COVID-19 Vaccine  Completed  . PNA vac Low Risk Adult  Completed    Health Maintenance  Health Maintenance Due  Topic Date Due  . INFLUENZA VACCINE  07/13/2020    Colorectal cancer screening: No longer required.  Mammogram status: Completed 03/24/2020. Repeat every year Bone Density status: Completed 07/24/2019.   Lung Cancer Screening: (Low Dose CT  Chest recommended if Age 24-80 years, 30 pack-year currently smoking OR have quit  w/in 15years.) does not qualify.   Lung Cancer Screening Referral: no  Additional Screening:  Hepatitis C Screening: does not qualify  Vision Screening: Recommended annual ophthalmology exams for early detection of glaucoma and other disorders of the eye. Is the patient up to date with their annual eye exam?  Yes  Who is the provider or what is the name of the office in which the patient attends annual eye exams? Does not remember name If pt is not established with a provider, would they like to be referred to a provider to establish care? No .   Dental Screening: Recommended annual dental exams for proper oral hygiene  Community Resource Referral / Chronic Care Management: CRR required this visit?  No   CCM required this visit?  No      Plan:     I have personally reviewed and noted the following in the patient's chart:   . Medical and social history . Use of alcohol, tobacco or illicit drugs  . Current medications and supplements . Functional ability and status . Nutritional status . Physical activity . Advanced directives . List of other physicians . Hospitalizations, surgeries, and ER visits in previous 12 months . Vitals . Screenings to include cognitive, depression, and falls . Referrals and appointments  In addition, I have reviewed and discussed with patient certain preventive protocols, quality metrics, and best practice recommendations. A written personalized care plan for preventive services as well as general preventive health recommendations were provided to patient.     Barb Merino, LPN   04/18/8468   Nurse Notes:

## 2020-07-17 LAB — CMP14+EGFR
ALT: 16 IU/L (ref 0–32)
AST: 19 IU/L (ref 0–40)
Albumin/Globulin Ratio: 1.5 (ref 1.2–2.2)
Albumin: 4.4 g/dL (ref 3.6–4.6)
Alkaline Phosphatase: 97 IU/L (ref 48–121)
BUN/Creatinine Ratio: 25 (ref 12–28)
BUN: 25 mg/dL (ref 8–27)
Bilirubin Total: 0.4 mg/dL (ref 0.0–1.2)
CO2: 23 mmol/L (ref 20–29)
Calcium: 9.8 mg/dL (ref 8.7–10.3)
Chloride: 100 mmol/L (ref 96–106)
Creatinine, Ser: 1 mg/dL (ref 0.57–1.00)
GFR calc Af Amer: 60 mL/min/{1.73_m2} (ref >59)
GFR calc non Af Amer: 52 mL/min/{1.73_m2} — ABNORMAL LOW (ref >59)
Globulin, Total: 3 g/dL (ref 1.5–4.5)
Glucose: 87 mg/dL (ref 65–99)
Potassium: 4.1 mmol/L (ref 3.5–5.2)
Sodium: 139 mmol/L (ref 134–144)
Total Protein: 7.4 g/dL (ref 6.0–8.5)

## 2020-07-17 LAB — CBC
Hematocrit: 32.3 % — ABNORMAL LOW (ref 34.0–46.6)
Hemoglobin: 10.3 g/dL — ABNORMAL LOW (ref 11.1–15.9)
MCH: 27.2 pg (ref 26.6–33.0)
MCHC: 31.9 g/dL (ref 31.5–35.7)
MCV: 85 fL (ref 79–97)
Platelets: 326 10*3/uL (ref 150–450)
RBC: 3.79 x10E6/uL (ref 3.77–5.28)
RDW: 14.5 % (ref 11.7–15.4)
WBC: 11.2 10*3/uL — ABNORMAL HIGH (ref 3.4–10.8)

## 2020-07-17 LAB — HEMOGLOBIN A1C
Est. average glucose Bld gHb Est-mCnc: 108 mg/dL
Hgb A1c MFr Bld: 5.4 % (ref 4.8–5.6)

## 2020-07-17 LAB — HEPATITIS C ANTIBODY: Hep C Virus Ab: <0.1 s/co ratio (ref 0.0–0.9)

## 2020-07-17 LAB — LIPID PANEL
Chol/HDL Ratio: 2.3 ratio (ref 0.0–4.4)
Cholesterol, Total: 165 mg/dL (ref 100–199)
HDL: 73 mg/dL (ref >39)
LDL Chol Calc (NIH): 80 mg/dL (ref 0–99)
Triglycerides: 58 mg/dL (ref 0–149)
VLDL Cholesterol Cal: 12 mg/dL (ref 5–40)

## 2020-07-22 ENCOUNTER — Ambulatory Visit
Admission: RE | Admit: 2020-07-22 | Discharge: 2020-07-22 | Disposition: A | Discharge status: Home / Self Care | Payer: Medicare Other | Source: Ambulatory Visit | Attending: Internal Medicine | Admitting: Internal Medicine

## 2020-07-22 DIAGNOSIS — M5416 Radiculopathy, lumbar region: Secondary | ICD-10-CM

## 2020-07-22 DIAGNOSIS — M48061 Spinal stenosis, lumbar region without neurogenic claudication: Secondary | ICD-10-CM | POA: Diagnosis not present

## 2020-07-28 ENCOUNTER — Telehealth: Payer: Self-pay

## 2020-07-28 NOTE — Telephone Encounter (Signed)
I returned the pt's call and notified her that she can drop off her hemoccult card.

## 2020-07-29 ENCOUNTER — Other Ambulatory Visit: Payer: Self-pay

## 2020-07-29 DIAGNOSIS — M4800 Spinal stenosis, site unspecified: Secondary | ICD-10-CM

## 2020-07-30 ENCOUNTER — Other Ambulatory Visit: Payer: Self-pay

## 2020-07-31 ENCOUNTER — Other Ambulatory Visit: Payer: Self-pay

## 2020-07-31 ENCOUNTER — Other Ambulatory Visit (INDEPENDENT_AMBULATORY_CARE_PROVIDER_SITE_OTHER): Payer: Medicare Other

## 2020-07-31 DIAGNOSIS — D649 Anemia, unspecified: Secondary | ICD-10-CM | POA: Diagnosis not present

## 2020-07-31 LAB — HEMOCCULT GUIAC POC 1CARD (OFFICE)
Card #2 Fecal Occult Blod, POC: NEGATIVE
Card #3 Fecal Occult Blood, POC: NEGATIVE
Fecal Occult Blood, POC: NEGATIVE

## 2020-08-11 ENCOUNTER — Other Ambulatory Visit: Payer: Self-pay

## 2020-08-11 MED ORDER — PANTOPRAZOLE SODIUM 40 MG PO TBEC
40.0000 mg | DELAYED_RELEASE_TABLET | Freq: Every day | ORAL | 11 refills | Status: DC
Start: 2020-08-11 — End: 2021-05-25

## 2020-08-12 DIAGNOSIS — M4317 Spondylolisthesis, lumbosacral region: Secondary | ICD-10-CM | POA: Diagnosis not present

## 2020-08-12 DIAGNOSIS — M5441 Lumbago with sciatica, right side: Secondary | ICD-10-CM | POA: Diagnosis not present

## 2020-08-12 DIAGNOSIS — M4316 Spondylolisthesis, lumbar region: Secondary | ICD-10-CM | POA: Diagnosis not present

## 2020-08-12 DIAGNOSIS — M4726 Other spondylosis with radiculopathy, lumbar region: Secondary | ICD-10-CM | POA: Diagnosis not present

## 2020-08-20 DIAGNOSIS — Z961 Presence of intraocular lens: Secondary | ICD-10-CM | POA: Diagnosis not present

## 2020-08-21 DIAGNOSIS — H353221 Exudative age-related macular degeneration, left eye, with active choroidal neovascularization: Secondary | ICD-10-CM | POA: Diagnosis not present

## 2020-09-07 ENCOUNTER — Encounter (HOSPITAL_COMMUNITY): Payer: Self-pay

## 2020-09-07 ENCOUNTER — Ambulatory Visit (HOSPITAL_COMMUNITY)
Admission: EM | Admit: 2020-09-07 | Discharge: 2020-09-07 | Disposition: A | Discharge status: Home / Self Care | Payer: Medicare Other | Attending: Internal Medicine | Admitting: Internal Medicine

## 2020-09-07 ENCOUNTER — Other Ambulatory Visit: Payer: Self-pay

## 2020-09-07 DIAGNOSIS — Z20822 Contact with and (suspected) exposure to covid-19: Secondary | ICD-10-CM | POA: Insufficient documentation

## 2020-09-07 DIAGNOSIS — Z0189 Encounter for other specified special examinations: Secondary | ICD-10-CM

## 2020-09-07 NOTE — ED Triage Notes (Signed)
Pt presents for COVID testing. Pt denies fever, shortness of breath, cough, or any other symptoms.    

## 2020-09-07 NOTE — ED Notes (Signed)
BP reported to [facility].

## 2020-09-08 LAB — SARS CORONAVIRUS 2 (TAT 6-24 HRS): SARS Coronavirus 2: NEGATIVE

## 2020-09-15 DIAGNOSIS — M4317 Spondylolisthesis, lumbosacral region: Secondary | ICD-10-CM | POA: Diagnosis not present

## 2020-09-15 DIAGNOSIS — M5136 Other intervertebral disc degeneration, lumbar region: Secondary | ICD-10-CM | POA: Diagnosis not present

## 2020-09-15 DIAGNOSIS — M47816 Spondylosis without myelopathy or radiculopathy, lumbar region: Secondary | ICD-10-CM | POA: Diagnosis not present

## 2020-09-25 DIAGNOSIS — H353221 Exudative age-related macular degeneration, left eye, with active choroidal neovascularization: Secondary | ICD-10-CM | POA: Diagnosis not present

## 2020-10-08 DIAGNOSIS — M47816 Spondylosis without myelopathy or radiculopathy, lumbar region: Secondary | ICD-10-CM | POA: Diagnosis not present

## 2020-10-08 DIAGNOSIS — M5136 Other intervertebral disc degeneration, lumbar region: Secondary | ICD-10-CM | POA: Diagnosis not present

## 2020-10-14 DIAGNOSIS — Z23 Encounter for immunization: Secondary | ICD-10-CM | POA: Diagnosis not present

## 2020-10-23 ENCOUNTER — Other Ambulatory Visit: Payer: Self-pay | Admitting: Internal Medicine

## 2020-10-30 DIAGNOSIS — H353221 Exudative age-related macular degeneration, left eye, with active choroidal neovascularization: Secondary | ICD-10-CM | POA: Diagnosis not present

## 2020-11-18 DIAGNOSIS — M47816 Spondylosis without myelopathy or radiculopathy, lumbar region: Secondary | ICD-10-CM | POA: Diagnosis not present

## 2020-11-24 DIAGNOSIS — H43392 Other vitreous opacities, left eye: Secondary | ICD-10-CM | POA: Diagnosis not present

## 2020-11-24 DIAGNOSIS — H5319 Other subjective visual disturbances: Secondary | ICD-10-CM | POA: Diagnosis not present

## 2020-11-24 DIAGNOSIS — H43312 Vitreous membranes and strands, left eye: Secondary | ICD-10-CM | POA: Diagnosis not present

## 2020-11-24 DIAGNOSIS — H353221 Exudative age-related macular degeneration, left eye, with active choroidal neovascularization: Secondary | ICD-10-CM | POA: Diagnosis not present

## 2020-12-09 DIAGNOSIS — H353221 Exudative age-related macular degeneration, left eye, with active choroidal neovascularization: Secondary | ICD-10-CM | POA: Diagnosis not present

## 2020-12-11 DIAGNOSIS — M5136 Other intervertebral disc degeneration, lumbar region: Secondary | ICD-10-CM | POA: Diagnosis not present

## 2020-12-11 DIAGNOSIS — M47816 Spondylosis without myelopathy or radiculopathy, lumbar region: Secondary | ICD-10-CM | POA: Diagnosis not present

## 2020-12-26 ENCOUNTER — Other Ambulatory Visit: Payer: Medicare Other

## 2021-01-20 ENCOUNTER — Other Ambulatory Visit: Payer: Self-pay

## 2021-01-20 ENCOUNTER — Encounter: Payer: Self-pay | Admitting: Internal Medicine

## 2021-01-20 ENCOUNTER — Ambulatory Visit (INDEPENDENT_AMBULATORY_CARE_PROVIDER_SITE_OTHER): Payer: Medicare Other | Admitting: Internal Medicine

## 2021-01-20 VITALS — BP 124/68 | HR 82 | Temp 98.2°F | Ht 60.4 in | Wt 181.4 lb

## 2021-01-20 DIAGNOSIS — D649 Anemia, unspecified: Secondary | ICD-10-CM

## 2021-01-20 DIAGNOSIS — Z6834 Body mass index (BMI) 34.0-34.9, adult: Secondary | ICD-10-CM

## 2021-01-20 DIAGNOSIS — E559 Vitamin D deficiency, unspecified: Secondary | ICD-10-CM | POA: Diagnosis not present

## 2021-01-20 DIAGNOSIS — M1711 Unilateral primary osteoarthritis, right knee: Secondary | ICD-10-CM

## 2021-01-20 DIAGNOSIS — E6609 Other obesity due to excess calories: Secondary | ICD-10-CM

## 2021-01-20 DIAGNOSIS — I10 Essential (primary) hypertension: Secondary | ICD-10-CM

## 2021-01-20 NOTE — Patient Instructions (Signed)

## 2021-01-20 NOTE — Progress Notes (Signed)
I,Katawbba Wiggins,acting as a Education administrator for [name], MD.,have documented all relevant documentation on the behalf of [name], MD,as directed by  [name], MD while in the presence of [name], MD.  This visit occurred during the SARS-CoV-2 public health emergency.  Safety protocols were in place, including screening questions prior to the visit, additional usage of staff PPE, and extensive cleaning of exam room while observing appropriate contact time as indicated for disinfecting solutions.  Subjective:     Patient ID: Tricia Ferguson , female    DOB: Oct 13, 1934 , 87 y.o.   MRN: [id number]   Chief Complaint  Patient presents with  . Hypertension    HPI  She is here today for BP check. She reports compliance with lisinopril 28m daily. She denies having any side effects. She denies headaches, chest pain and shortness of breath.   Hypertension This is a chronic problem. The current episode started more than 1 year ago. The problem has been gradually improving since onset. The problem is controlled. Pertinent negatives include no blurred vision, chest pain, palpitations or shortness of breath. Risk factors for coronary artery disease include obesity and post-menopausal state. The current treatment provides moderate improvement.     Past Medical History:  Diagnosis Date  . Chronic kidney disease   . Hypertension   . Peripheral vascular disease ([place]   . Vitamin D deficiency disease      Family History  Problem Relation Age of Onset  . Hypertension Mother   . Throat cancer Mother   . Prostate cancer Father   . Tremor Father   . Hypertension Sister   . Diabetes Sister   . Hypertension Brother   . Hypertension Sister   . Diabetes Sister   . Hypertension Sister      Current Outpatient Medications:  .  Acetaminophen (TYLENOL PO), Take by mouth., Disp: , Rfl:  .  Ascorbic Acid (VITAMIN C PO), Take by mouth., Disp: , Rfl:  .  calcium  carbonate (TUMS - DOSED IN MG ELEMENTAL CALCIUM) 500 MG chewable tablet, Chew 2 tablets by mouth daily. , Disp: , Rfl:  .  Cholecalciferol (VITAMIN D3 PO), Take 2,000 Units by mouth daily. , Disp: , Rfl:  .  fish oil-omega-3 fatty acids 1000 MG capsule, Take 2 g by mouth daily., Disp: , Rfl:  .  Garlic 13557MG CAPS, Take by mouth., Disp: , Rfl:  .  glucosamine-chondroitin 500-400 MG tablet, Take 1 tablet by mouth 3 (three) times daily., Disp: , Rfl:  .  lisinopril (ZESTRIL) 5 MG tablet, TAKE 1 TABLET(5 MG) BY MOUTH DAILY, Disp: 90 tablet, Rfl: 1 .  loratadine (CLARITIN) 10 MG tablet, Take 10 mg by mouth daily., Disp: , Rfl:  .  Multiple Vitamin (MULTIVITAMIN) tablet, Take 1 tablet by mouth daily., Disp: , Rfl:  .  Multiple Vitamins-Minerals (PRESERVISION AREDS PO), Take by mouth., Disp: , Rfl:  .  pantoprazole (PROTONIX) 40 MG tablet, Take 1 tablet (40 mg total) by mouth daily. 30 minutes before breakfast., Disp: 30 tablet, Rfl: 11 .  naproxen (NAPROSYN) 375 MG tablet, TAKE 1 TABLET(375 MG) BY MOUTH TWICE DAILY (Patient not taking: No sig reported), Disp: 30 tablet, Rfl: 0   Allergies  Allergen Reactions  . Amoxicillin Itching  . Penicillins Hives  . Potassium-Containing Compounds Itching     Review of Systems  Constitutional: Negative.   Eyes: Negative for blurred vision.  Respiratory: Negative.  Negative for shortness of breath.  Cardiovascular: Negative.  Negative for chest pain and palpitations.  Gastrointestinal: Negative.   Musculoskeletal: Positive for arthralgias and back pain.  Psychiatric/Behavioral: Negative.   All other systems reviewed and are negative.    Today's Vitals   01/20/21 1038  BP: 124/68  Pulse: 82  Temp: 98.2 F (36.8 C)  TempSrc: Oral  Weight: 181 lb 6.4 oz (82.3 kg)  Height: 5' 0.4" (1.534 m)  PainSc: 4   PainLoc: Leg   Body mass index is 34.96 kg/m.  Wt Readings from Last 3 Encounters:  01/20/21 181 lb 6.4 oz (82.3 kg)  07/16/20 191 lb 9.6 oz  (86.9 kg)  07/16/20 191 lb (86.6 kg)   Objective:  Physical Exam Vitals and nursing note reviewed.  Constitutional:      Appearance: Normal appearance. She is obese.  HENT:     Head: Normocephalic and atraumatic.     Nose:     Comments: Masked     Mouth/Throat:     Comments: Masked  Cardiovascular:     Rate and Rhythm: Normal rate and regular rhythm.     Heart sounds: Normal heart sounds.  Pulmonary:     Breath sounds: Normal breath sounds.  Musculoskeletal:        General: Swelling present.     Cervical back: Normal range of motion.  Skin:    General: Skin is warm.  Neurological:     General: No focal deficit present.     Mental Status: She is alert and oriented to person, place, and time.         Assessment And Plan:     1. Essential hypertension, benign Comments: Chronic, well controlled. She is encouraged to continue with low sodium diet. I will check renal function today.  - CBC with Diff - CMP14+EGFR  2. Anemia, unspecified type Comments: I will check an iron panel today. I will make further recommendations once her labs are available for review.  - CBC with Diff - Iron and IBC ([id number]) - B12 and Folate Panel  3. Primary osteoarthritis of right knee Comments: Chronic. She is ambulatory with cane, does not wish to have surgery. I will refer her to [facility]. - Ambulatory referral to Orthopedic Surgery  4. Vitamin D deficiency disease Comments: I will check vitamin D level and supplement as needed.  - Vitamin D (25 hydroxy)  5. Class 1 obesity due to excess calories with serious comorbidity and body mass index (BMI) of 34.0 to 34.9 in adult Comments: She is encouraged to strive for BMI less than 30 to decrease cardiac risk. Advised to aim for at least 150 minutes of exercise per week.    Patient was given opportunity to ask questions. Patient verbalized understanding of the plan and was able to repeat key elements of the plan. All questions were  answered to their satisfaction.   I, [name], MD, have reviewed all documentation for this visit. The documentation on 02/01/21 for the exam, diagnosis, procedures, and orders are all accurate and complete.  THE PATIENT IS ENCOURAGED TO PRACTICE SOCIAL DISTANCING DUE TO THE COVID-19 PANDEMIC.

## 2021-01-21 LAB — CMP14+EGFR
ALT: 16 IU/L (ref 0–32)
AST: 19 IU/L (ref 0–40)
Albumin/Globulin Ratio: 1.4 (ref 1.2–2.2)
Albumin: 3.9 g/dL (ref 3.6–4.6)
Alkaline Phosphatase: 89 IU/L (ref 44–121)
BUN/Creatinine Ratio: 30 — ABNORMAL HIGH (ref 12–28)
BUN: 26 mg/dL (ref 8–27)
Bilirubin Total: 0.3 mg/dL (ref 0.0–1.2)
CO2: 23 mmol/L (ref 20–29)
Calcium: 9.1 mg/dL (ref 8.7–10.3)
Chloride: 108 mmol/L — ABNORMAL HIGH (ref 96–106)
Creatinine, Ser: 0.88 mg/dL (ref 0.57–1.00)
GFR calc Af Amer: 69 mL/min/{1.73_m2} (ref >59)
GFR calc non Af Amer: 60 mL/min/{1.73_m2} (ref >59)
Globulin, Total: 2.7 g/dL (ref 1.5–4.5)
Glucose: 94 mg/dL (ref 65–99)
Potassium: 4.4 mmol/L (ref 3.5–5.2)
Sodium: 145 mmol/L — ABNORMAL HIGH (ref 134–144)
Total Protein: 6.6 g/dL (ref 6.0–8.5)

## 2021-01-21 LAB — CBC WITH DIFFERENTIAL/PLATELET
Basophils Absolute: 0.1 10*3/uL (ref 0.0–0.2)
Basos: 1 %
EOS (ABSOLUTE): 0.2 10*3/uL (ref 0.0–0.4)
Eos: 2 %
Hematocrit: 33.6 % — ABNORMAL LOW (ref 34.0–46.6)
Hemoglobin: 10.9 g/dL — ABNORMAL LOW (ref 11.1–15.9)
Immature Grans (Abs): 0 10*3/uL (ref 0.0–0.1)
Immature Granulocytes: 0 %
Lymphocytes Absolute: 3.1 10*3/uL (ref 0.7–3.1)
Lymphs: 32 %
MCH: 30.4 pg (ref 26.6–33.0)
MCHC: 32.4 g/dL (ref 31.5–35.7)
MCV: 94 fL (ref 79–97)
Monocytes Absolute: 1 10*3/uL — ABNORMAL HIGH (ref 0.1–0.9)
Monocytes: 10 %
Neutrophils Absolute: 5.3 10*3/uL (ref 1.4–7.0)
Neutrophils: 55 %
Platelets: 257 10*3/uL (ref 150–450)
RBC: 3.58 x10E6/uL — ABNORMAL LOW (ref 3.77–5.28)
RDW: 11.9 % (ref 11.7–15.4)
WBC: 9.6 10*3/uL (ref 3.4–10.8)

## 2021-01-21 LAB — B12 AND FOLATE PANEL
Folate: >20 ng/mL (ref >3.0)
Vitamin B-12: 561 pg/mL (ref 232–1245)

## 2021-01-21 LAB — VITAMIN D 25 HYDROXY (VIT D DEFICIENCY, FRACTURES): Vit D, 25-Hydroxy: 68 ng/mL (ref 30.0–100.0)

## 2021-01-21 LAB — IRON AND TIBC
Iron Saturation: 36 % (ref 15–55)
Iron: 78 ug/dL (ref 27–139)
Total Iron Binding Capacity: 215 ug/dL — ABNORMAL LOW (ref 250–450)
UIBC: 137 ug/dL (ref 118–369)

## 2021-01-22 DIAGNOSIS — H353221 Exudative age-related macular degeneration, left eye, with active choroidal neovascularization: Secondary | ICD-10-CM | POA: Diagnosis not present

## 2021-02-03 DIAGNOSIS — M47816 Spondylosis without myelopathy or radiculopathy, lumbar region: Secondary | ICD-10-CM | POA: Diagnosis not present

## 2021-03-03 DIAGNOSIS — M5416 Radiculopathy, lumbar region: Secondary | ICD-10-CM | POA: Diagnosis not present

## 2021-03-03 DIAGNOSIS — M48061 Spinal stenosis, lumbar region without neurogenic claudication: Secondary | ICD-10-CM | POA: Insufficient documentation

## 2021-03-13 ENCOUNTER — Other Ambulatory Visit: Payer: Self-pay | Admitting: Internal Medicine

## 2021-03-13 DIAGNOSIS — Z1231 Encounter for screening mammogram for malignant neoplasm of breast: Secondary | ICD-10-CM

## 2021-03-19 DIAGNOSIS — H353221 Exudative age-related macular degeneration, left eye, with active choroidal neovascularization: Secondary | ICD-10-CM | POA: Diagnosis not present

## 2021-04-06 DIAGNOSIS — M48061 Spinal stenosis, lumbar region without neurogenic claudication: Secondary | ICD-10-CM | POA: Diagnosis not present

## 2021-04-07 DIAGNOSIS — H524 Presbyopia: Secondary | ICD-10-CM | POA: Diagnosis not present

## 2021-05-04 ENCOUNTER — Ambulatory Visit
Admission: RE | Admit: 2021-05-04 | Discharge: 2021-05-04 | Disposition: A | Discharge status: Home / Self Care | Payer: Medicare Other | Source: Ambulatory Visit | Attending: Internal Medicine | Admitting: Internal Medicine

## 2021-05-04 ENCOUNTER — Other Ambulatory Visit: Payer: Self-pay

## 2021-05-04 DIAGNOSIS — Z1231 Encounter for screening mammogram for malignant neoplasm of breast: Secondary | ICD-10-CM | POA: Diagnosis not present

## 2021-05-05 DIAGNOSIS — M5136 Other intervertebral disc degeneration, lumbar region: Secondary | ICD-10-CM | POA: Diagnosis not present

## 2021-05-05 DIAGNOSIS — M5416 Radiculopathy, lumbar region: Secondary | ICD-10-CM | POA: Diagnosis not present

## 2021-05-05 DIAGNOSIS — M4317 Spondylolisthesis, lumbosacral region: Secondary | ICD-10-CM | POA: Diagnosis not present

## 2021-05-21 DIAGNOSIS — H353221 Exudative age-related macular degeneration, left eye, with active choroidal neovascularization: Secondary | ICD-10-CM | POA: Diagnosis not present

## 2021-05-25 ENCOUNTER — Other Ambulatory Visit: Payer: Self-pay | Admitting: Internal Medicine

## 2021-07-20 ENCOUNTER — Other Ambulatory Visit: Payer: Self-pay | Admitting: Internal Medicine

## 2021-07-22 ENCOUNTER — Encounter: Payer: Medicare Other | Admitting: Internal Medicine

## 2021-07-29 DIAGNOSIS — H353221 Exudative age-related macular degeneration, left eye, with active choroidal neovascularization: Secondary | ICD-10-CM | POA: Diagnosis not present

## 2021-08-10 ENCOUNTER — Ambulatory Visit (INDEPENDENT_AMBULATORY_CARE_PROVIDER_SITE_OTHER): Payer: Medicare Other | Admitting: Internal Medicine

## 2021-08-10 ENCOUNTER — Other Ambulatory Visit: Payer: Self-pay | Admitting: Internal Medicine

## 2021-08-10 ENCOUNTER — Encounter: Payer: Self-pay | Admitting: Internal Medicine

## 2021-08-10 ENCOUNTER — Other Ambulatory Visit: Payer: Self-pay

## 2021-08-10 VITALS — BP 120/76 | HR 74 | Temp 98.6°F | Ht 59.2 in | Wt 187.8 lb

## 2021-08-10 DIAGNOSIS — M1711 Unilateral primary osteoarthritis, right knee: Secondary | ICD-10-CM | POA: Diagnosis not present

## 2021-08-10 DIAGNOSIS — H6121 Impacted cerumen, right ear: Secondary | ICD-10-CM | POA: Diagnosis not present

## 2021-08-10 DIAGNOSIS — Z0000 Encounter for general adult medical examination without abnormal findings: Secondary | ICD-10-CM | POA: Diagnosis not present

## 2021-08-10 DIAGNOSIS — D649 Anemia, unspecified: Secondary | ICD-10-CM | POA: Diagnosis not present

## 2021-08-10 DIAGNOSIS — I10 Essential (primary) hypertension: Secondary | ICD-10-CM | POA: Diagnosis not present

## 2021-08-10 DIAGNOSIS — J309 Allergic rhinitis, unspecified: Secondary | ICD-10-CM

## 2021-08-10 DIAGNOSIS — Z6836 Body mass index (BMI) 36.0-36.9, adult: Secondary | ICD-10-CM

## 2021-08-10 DIAGNOSIS — R0982 Postnasal drip: Secondary | ICD-10-CM

## 2021-08-10 LAB — POCT URINALYSIS DIPSTICK
Bilirubin, UA: NEGATIVE
Blood, UA: NEGATIVE
Glucose, UA: NEGATIVE
Ketones, UA: NEGATIVE
Leukocytes, UA: NEGATIVE
Nitrite, UA: NEGATIVE
Protein, UA: NEGATIVE
Spec Grav, UA: 1.015 (ref 1.010–1.025)
Urobilinogen, UA: 0.2 E.U./dL
pH, UA: 5.5 (ref 5.0–8.0)

## 2021-08-10 LAB — POCT UA - MICROALBUMIN
Albumin/Creatinine Ratio, Urine, POC: <30
Creatinine, POC: 200 mg/dL
Microalbumin Ur, POC: 10 mg/L

## 2021-08-10 MED ORDER — LEVOCETIRIZINE DIHYDROCHLORIDE 5 MG PO TABS: 5.0000 mg | ORAL_TABLET | Freq: Every evening | ORAL | 1 refills | Status: DC

## 2021-08-10 MED ORDER — TRAMADOL HCL 50 MG PO TABS: 50.0000 mg | ORAL_TABLET | Freq: Four times a day (QID) | ORAL | 0 refills | Status: DC | PRN

## 2021-08-10 NOTE — Progress Notes (Signed)
I,Katawbba Wiggins,acting as a Education administrator for [name], MD.,have documented all relevant documentation on the behalf of [name], MD,as directed by  [name], MD while in the presence of [name], MD.  This visit occurred during the SARS-CoV-2 public health emergency.  Safety protocols were in place, including screening questions prior to the visit, additional usage of staff PPE, and extensive cleaning of exam room while observing appropriate contact time as indicated for disinfecting solutions.  Subjective:     Patient ID: Tricia Ferguson , female    DOB: 10-10-1934 , 87 y.o.   MRN: [id number]   Chief Complaint  Patient presents with   Annual Exam   Hypertension    HPI  She is here today for a full physical examination. She is no longer seen by GYN. She has no new concerns or complaints at this time. She prefers to be examined in the chair, has fear of falling off of exam table.   Hypertension This is a chronic problem. The current episode started more than 1 year ago. The problem has been gradually improving since onset. The problem is controlled. Pertinent negatives include no blurred vision, chest pain or shortness of breath. Risk factors for coronary artery disease include obesity, post-menopausal state and sedentary lifestyle. Past treatments include ACE inhibitors. The current treatment provides moderate improvement. Compliance problems include exercise.     Past Medical History:  Diagnosis Date   Chronic kidney disease    Hypertension    Peripheral vascular disease ([place])    Vitamin D deficiency disease      Family History  Problem Relation Age of Onset   Hypertension Mother    Throat cancer Mother    Prostate cancer Father    Tremor Father    Hypertension Sister    Diabetes Sister    Hypertension Brother    Hypertension Sister    Diabetes Sister    Hypertension Sister      Current Outpatient Medications:    Acetaminophen (TYLENOL  PO), Take by mouth., Disp: , Rfl:    Ascorbic Acid (VITAMIN C PO), Take by mouth., Disp: , Rfl:    calcium carbonate (TUMS - DOSED IN MG ELEMENTAL CALCIUM) 500 MG chewable tablet, Chew 2 tablets by mouth daily. , Disp: , Rfl:    Cholecalciferol (VITAMIN D3 PO), Take 2,000 Units by mouth daily. , Disp: , Rfl:    fish oil-omega-3 fatty acids 1000 MG capsule, Take 2 g by mouth daily., Disp: , Rfl:    Garlic 0962 MG CAPS, Take by mouth., Disp: , Rfl:    glucosamine-chondroitin 500-400 MG tablet, Take 1 tablet by mouth 3 (three) times daily., Disp: , Rfl:    lisinopril (ZESTRIL) 5 MG tablet, TAKE 1 TABLET(5 MG) BY MOUTH DAILY, Disp: 90 tablet, Rfl: 1   Multiple Vitamin (MULTIVITAMIN) tablet, Take 1 tablet by mouth daily., Disp: , Rfl:    Multiple Vitamins-Minerals (PRESERVISION AREDS PO), Take by mouth., Disp: , Rfl:    pantoprazole (PROTONIX) 40 MG tablet, TAKE 1 TABLET(40 MG) BY MOUTH DAILY 30 MINUTES BEFORE BREAKFAST, Disp: 30 tablet, Rfl: 11   pantoprazole (PROTONIX) 40 MG tablet, TAKE 1 TABLET(40 MG) BY MOUTH DAILY 30 MINUTES BEFORE BREAKFAST, Disp: 30 tablet, Rfl: 11   traMADol (ULTRAM) 50 MG tablet, Take 1 tablet (50 mg total) by mouth every 6 (six) hours as needed., Disp: 30 tablet, Rfl: 0   levocetirizine (XYZAL) 5 MG tablet, TAKE 1 TABLET(5 MG) BY MOUTH EVERY  EVENING, Disp: 90 tablet, Rfl: 1   Allergies  Allergen Reactions   Amoxicillin Itching   Penicillins Hives   Potassium-Containing Compounds Itching      The patient states she uses post menopausal status for birth control. Last LMP was No LMP recorded. Patient has had a hysterectomy.. Negative for Dysmenorrhea. Negative for: breast discharge, breast lump(s), breast pain and breast self exam. Associated symptoms include abnormal vaginal bleeding. Pertinent negatives include abnormal bleeding (hematology), anxiety, decreased libido, depression, difficulty falling sleep, dyspareunia, history of infertility, nocturia, sexual dysfunction,  sleep disturbances, urinary incontinence, urinary urgency, vaginal discharge and vaginal itching. Diet regular.The patient states her exercise level is    . The patient's tobacco use is:  Social History   Tobacco Use  Smoking Status Former   Packs/day: 1.00   Years: 20.00   Pack years: 20.00   Types: Cigarettes   Quit date: 2004   Years since quitting: 18.7  Smokeless Tobacco Never  Tobacco Comments   n/a  . She has been exposed to passive smoke. The patient's alcohol use is:  Social History   Substance and Sexual Activity  Alcohol Use No   Review of Systems  Constitutional: Negative.   HENT: Negative.    Eyes: Negative.  Negative for blurred vision.  Respiratory: Negative.  Negative for shortness of breath.   Cardiovascular: Negative.  Negative for chest pain.  Endocrine: Negative.   Genitourinary: Negative.   Musculoskeletal:  Positive for arthralgias.  Skin: Negative.   Allergic/Immunologic: Negative.   Neurological: Negative.   Hematological: Negative.   Psychiatric/Behavioral: Negative.      Today's Vitals   08/10/21 1111  BP: 120/76  Pulse: 74  Temp: 98.6 F (37 C)  TempSrc: Oral  Weight: 187 lb 12.8 oz (85.2 kg)  Height: 4' 11.2" (1.504 m)  PainSc: 5   PainLoc: Leg   Body mass index is 37.68 kg/m.  Wt Readings from Last 3 Encounters:  08/20/21 187 lb (84.8 kg)  08/13/21 184 lb 9.6 oz (83.7 kg)  08/10/21 187 lb 12.8 oz (85.2 kg)    BP Readings from Last 3 Encounters:  08/20/21 110/60  08/13/21 130/64  08/10/21 120/76    Objective:  Physical Exam Vitals and nursing note reviewed.  Constitutional:      Appearance: Normal appearance. She is obese.     Comments: Pt examined while seated in chair. Did not feel stable getting on exam table due to knee pain.   HENT:     Head: Normocephalic and atraumatic.     Right Ear: Ear canal and external ear normal. There is impacted cerumen.     Left Ear: Tympanic membrane, ear canal and external ear normal.      Nose:     Comments: Masked     Mouth/Throat:     Comments: Masked  Eyes:     Extraocular Movements: Extraocular movements intact.     Conjunctiva/sclera: Conjunctivae normal.     Pupils: Pupils are equal, round, and reactive to light.  Cardiovascular:     Rate and Rhythm: Normal rate and regular rhythm.     Pulses: Normal pulses.     Heart sounds: Normal heart sounds.  Pulmonary:     Effort: Pulmonary effort is normal.     Breath sounds: Normal breath sounds.  Chest:  Breasts:    Tanner Score is 5.     Right: Normal.     Left: Normal.     Comments: Pendulous Abdominal:  General: Bowel sounds are normal.     Palpations: Abdomen is soft.     Comments: Obese, soft, difficult to assess organomegaly  Genitourinary:    Comments: deferred Musculoskeletal:        General: Normal range of motion.     Cervical back: Normal range of motion and neck supple.  Skin:    General: Skin is warm and dry.  Neurological:     General: No focal deficit present.     Mental Status: She is alert and oriented to person, place, and time.  Psychiatric:        Mood and Affect: Mood normal.        Behavior: Behavior normal.        Assessment And Plan:     1. Routine medical exam Comments: A full exam was performed. Importance of monthly self breast exams was discussed with the patient. PATIENT IS ADVISED TO GET 30-45 MINUTES REGULAR EXERCISE NO LESS THAN FOUR TO FIVE DAYS PER WEEK - BOTH WEIGHTBEARING EXERCISES AND AEROBIC ARE RECOMMENDED.  PATIENT IS ADVISED TO FOLLOW A HEALTHY DIET WITH AT LEAST SIX FRUITS/VEGGIES PER DAY, DECREASE INTAKE OF RED MEAT, AND TO INCREASE FISH INTAKE TO TWO DAYS PER WEEK.  MEATS/FISH SHOULD NOT BE FRIED, BAKED OR BROILED IS PREFERABLE.  IT IS ALSO IMPORTANT TO CUT BACK ON YOUR SUGAR INTAKE. PLEASE AVOID ANYTHING WITH ADDED SUGAR, CORN SYRUP OR OTHER SWEETENERS. IF YOU MUST USE A SWEETENER, YOU CAN TRY STEVIA. IT IS ALSO IMPORTANT TO AVOID ARTIFICIALLY SWEETENERS  AND DIET BEVERAGES. LASTLY, I SUGGEST WEARING SPF 50 SUNSCREEN ON EXPOSED PARTS AND ESPECIALLY WHEN IN THE DIRECT SUNLIGHT FOR AN EXTENDED PERIOD OF TIME.  PLEASE AVOID FAST FOOD RESTAURANTS AND INCREASE YOUR WATER INTAKE.   2. Essential hypertension, benign Comments: She declines CCM referral at this time. Chronic, well controlled. EKG: NSR w/ voltage criteria for LVH. Advised to follow low sodium diet. No med changes.  - POCT Urinalysis Dipstick (81002) - EKG 12-Lead - POCT UA - Microalbumin - CBC - CMP14+EGFR - Lipid panel  3. Allergic rhinitis with postnasal drip Comments: I will d/c Zyrtec and send rx levocetirizine to her local pharmacy.   4. Primary osteoarthritis of right knee Comments: Chronic, ambulatory with cane. Mgmt as per Ortho.   5. Right ear impacted cerumen Comments: AFTER OBTAINING VERBAL CONSENT, RIGHT EAR WAS FLUSHED BY IRRIGATION. SHE TOLERATED PROCEDURE WELL W/O ANY COMPLICATIONS. NO TM ABNORMALITIES WERE NOTED.  6. Anemia, unspecified type Comments: I will check CBC today. I will add on iron studies as needed.   7. Class 2 severe obesity due to excess calories with serious comorbidity and body mass index (BMI) of 36.0 to 36.9 in adult [facility]) She is encouraged to strive for BMI less than 30 to decrease cardiac risk. Advised to aim for at least 150 minutes of exercise per week. She is reminded that chair exercises count towards this goal.    Patient was given opportunity to ask questions. Patient verbalized understanding of the plan and was able to repeat key elements of the plan. All questions were answered to their satisfaction.   I, [name], MD, have reviewed all documentation for this visit. The documentation on 08/10/21 for the exam, diagnosis, procedures, and orders are all accurate and complete.   THE PATIENT IS ENCOURAGED TO PRACTICE SOCIAL DISTANCING DUE TO THE COVID-19 PANDEMIC.

## 2021-08-10 NOTE — Patient Instructions (Signed)
Health Maintenance, Female Adopting a healthy lifestyle and getting preventive care are important in promoting health and wellness. Ask your health care provider about: The right schedule for you to have regular tests and exams. Things you can do on your own to prevent diseases and keep yourself healthy. What should I know about diet, weight, and exercise? Eat a healthy diet  Eat a diet that includes plenty of vegetables, fruits, low-fat dairy products, and lean protein. Do not eat a lot of foods that are high in solid fats, added sugars, or sodium.  Maintain a healthy weight Body mass index (BMI) is used to identify weight problems. It estimates body fat based on height and weight. Your health care provider can help determineyour BMI and help you achieve or maintain a healthy weight. Get regular exercise Get regular exercise. This is one of the most important things you can do for your health. Most adults should: Exercise for at least 150 minutes each week. The exercise should increase your heart rate and make you sweat (moderate-intensity exercise). Do strengthening exercises at least twice a week. This is in addition to the moderate-intensity exercise. Spend less time sitting. Even light physical activity can be beneficial. Watch cholesterol and blood lipids Have your blood tested for lipids and cholesterol at 85 years of age, then havethis test every 5 years. Have your cholesterol levels checked more often if: Your lipid or cholesterol levels are high. You are older than 85 years of age. You are at high risk for heart disease. What should I know about cancer screening? Depending on your health history and family history, you may need to have cancer screening at various ages. This may include screening for: Breast cancer. Cervical cancer. Colorectal cancer. Skin cancer. Lung cancer. What should I know about heart disease, diabetes, and high blood pressure? Blood pressure and heart  disease High blood pressure causes heart disease and increases the risk of stroke. This is more likely to develop in people who have high blood pressure readings, are of African descent, or are overweight. Have your blood pressure checked: Every 3-5 years if you are 18-39 years of age. Every year if you are 40 years old or older. Diabetes Have regular diabetes screenings. This checks your fasting blood sugar level. Have the screening done: Once every three years after age 40 if you are at a normal weight and have a low risk for diabetes. More often and at a younger age if you are overweight or have a high risk for diabetes. What should I know about preventing infection? Hepatitis B If you have a higher risk for hepatitis B, you should be screened for this virus. Talk with your health care provider to find out if you are at risk forhepatitis B infection. Hepatitis C Testing is recommended for: Everyone born from 1945 through 1965. Anyone with known risk factors for hepatitis C. Sexually transmitted infections (STIs) Get screened for STIs, including gonorrhea and chlamydia, if: You are sexually active and are younger than 85 years of age. You are older than 85 years of age and your health care provider tells you that you are at risk for this type of infection. Your sexual activity has changed since you were last screened, and you are at increased risk for chlamydia or gonorrhea. Ask your health care provider if you are at risk. Ask your health care provider about whether you are at high risk for HIV. Your health care provider may recommend a prescription medicine to help   prevent HIV infection. If you choose to take medicine to prevent HIV, you should first get tested for HIV. You should then be tested every 3 months for as long as you are taking the medicine. Pregnancy If you are about to stop having your period (premenopausal) and you may become pregnant, seek counseling before you get  pregnant. Take 400 to 800 micrograms (mcg) of folic acid every day if you become pregnant. Ask for birth control (contraception) if you want to prevent pregnancy. Osteoporosis and menopause Osteoporosis is a disease in which the bones lose minerals and strength with aging. This can result in bone fractures. If you are 65 years old or older, or if you are at risk for osteoporosis and fractures, ask your health care provider if you should: Be screened for bone loss. Take a calcium or vitamin D supplement to lower your risk of fractures. Be given hormone replacement therapy (HRT) to treat symptoms of menopause. Follow these instructions at home: Lifestyle Do not use any products that contain nicotine or tobacco, such as cigarettes, e-cigarettes, and chewing tobacco. If you need help quitting, ask your health care provider. Do not use street drugs. Do not share needles. Ask your health care provider for help if you need support or information about quitting drugs. Alcohol use Do not drink alcohol if: Your health care provider tells you not to drink. You are pregnant, may be pregnant, or are planning to become pregnant. If you drink alcohol: Limit how much you use to 0-1 drink a day. Limit intake if you are breastfeeding. Be aware of how much alcohol is in your drink. In the [place] one drink equals one 12 oz bottle of beer (355 mL), one 5 oz glass of wine (148 mL), or one 1 oz glass of hard liquor (44 mL). General instructions Schedule regular health, dental, and eye exams. Stay current with your vaccines. Tell your health care provider if: You often feel depressed. You have ever been abused or do not feel safe at home. Summary Adopting a healthy lifestyle and getting preventive care are important in promoting health and wellness. Follow your health care provider's instructions about healthy diet, exercising, and getting tested or screened for diseases. Follow your health care provider's  instructions on monitoring your cholesterol and blood pressure. This information is not intended to replace advice given to you by your health care provider. Make sure you discuss any questions you have with your healthcare provider. Document Revised: 11/22/2018 Document Reviewed: 11/22/2018 Elsevier Patient Education  2022 [employer]  

## 2021-08-11 LAB — CMP14+EGFR
ALT: 16 IU/L (ref 0–32)
AST: 23 IU/L (ref 0–40)
Albumin/Globulin Ratio: 1.4 (ref 1.2–2.2)
Albumin: 4.2 g/dL (ref 3.6–4.6)
Alkaline Phosphatase: 99 IU/L (ref 44–121)
BUN/Creatinine Ratio: 27 (ref 12–28)
BUN: 26 mg/dL (ref 8–27)
Bilirubin Total: 0.4 mg/dL (ref 0.0–1.2)
CO2: 22 mmol/L (ref 20–29)
Calcium: 9.4 mg/dL (ref 8.7–10.3)
Chloride: 105 mmol/L (ref 96–106)
Creatinine, Ser: 0.98 mg/dL (ref 0.57–1.00)
Globulin, Total: 2.9 g/dL (ref 1.5–4.5)
Glucose: 83 mg/dL (ref 65–99)
Potassium: 4.4 mmol/L (ref 3.5–5.2)
Sodium: 140 mmol/L (ref 134–144)
Total Protein: 7.1 g/dL (ref 6.0–8.5)
eGFR: 57 mL/min/{1.73_m2} — ABNORMAL LOW (ref >59)

## 2021-08-11 LAB — CBC
Hematocrit: 34.6 % (ref 34.0–46.6)
Hemoglobin: 10.9 g/dL — ABNORMAL LOW (ref 11.1–15.9)
MCH: 29.5 pg (ref 26.6–33.0)
MCHC: 31.5 g/dL (ref 31.5–35.7)
MCV: 94 fL (ref 79–97)
Platelets: 249 10*3/uL (ref 150–450)
RBC: 3.69 x10E6/uL — ABNORMAL LOW (ref 3.77–5.28)
RDW: 12.5 % (ref 11.7–15.4)
WBC: 8.5 10*3/uL (ref 3.4–10.8)

## 2021-08-11 LAB — LIPID PANEL
Chol/HDL Ratio: 2.1 ratio (ref 0.0–4.4)
Cholesterol, Total: 159 mg/dL (ref 100–199)
HDL: 75 mg/dL (ref >39)
LDL Chol Calc (NIH): 72 mg/dL (ref 0–99)
Triglycerides: 56 mg/dL (ref 0–149)
VLDL Cholesterol Cal: 12 mg/dL (ref 5–40)

## 2021-08-13 ENCOUNTER — Other Ambulatory Visit: Payer: Self-pay

## 2021-08-13 ENCOUNTER — Ambulatory Visit (INDEPENDENT_AMBULATORY_CARE_PROVIDER_SITE_OTHER): Payer: Medicare Other

## 2021-08-13 VITALS — BP 130/64 | HR 75 | Temp 98.3°F | Ht 59.6 in | Wt 184.6 lb

## 2021-08-13 DIAGNOSIS — Z0000 Encounter for general adult medical examination without abnormal findings: Secondary | ICD-10-CM | POA: Diagnosis not present

## 2021-08-13 NOTE — Patient Instructions (Signed)
Tricia Ferguson , Thank you for taking time to come for your Medicare Wellness Visit. I appreciate your ongoing commitment to your health goals. Please review the following plan we discussed and let me know if I can assist you in the future.   Screening recommendations/referrals: Colonoscopy: not required Mammogram: completed 05/04/2021 Bone Density: completed 07/24/2019 Recommended yearly ophthalmology/optometry visit for glaucoma screening and checkup Recommended yearly dental visit for hygiene and checkup  Vaccinations: Influenza vaccine: due Pneumococcal vaccine: completed 04/27/2017 Tdap vaccine: completed 06/27/2019, due 06/26/2029 Shingles vaccine: completed Covid-19: 04/02/2021, 10/06/2020, 02/09/2020, 01/19/2020  Advanced directives: Advance directive discussed with you today. Even though you declined this today please call our office should you change your mind and we can give you the proper paperwork for you to fill out.  Conditions/risks identified: none  Next appointment: Follow up in one year for your annual wellness visit    Preventive Care 65 Years and Older, Female Preventive care refers to lifestyle choices and visits with your health care provider that can promote health and wellness. What does preventive care include? A yearly physical exam. This is also called an annual well check. Dental exams once or twice a year. Routine eye exams. Ask your health care provider how often you should have your eyes checked. Personal lifestyle choices, including: Daily care of your teeth and gums. Regular physical activity. Eating a healthy diet. Avoiding tobacco and drug use. Limiting alcohol use. Practicing safe sex. Taking low-dose aspirin every day. Taking vitamin and mineral supplements as recommended by your health care provider. What happens during an annual well check? The services and screenings done by your health care provider during your annual well check will depend on  your age, overall health, lifestyle risk factors, and family history of disease. Counseling  Your health care provider may ask you questions about your: Alcohol use. Tobacco use. Drug use. Emotional well-being. Home and relationship well-being. Sexual activity. Eating habits. History of falls. Memory and ability to understand (cognition). Work and work Astronomer. Reproductive health. Screening  You may have the following tests or measurements: Height, weight, and BMI. Blood pressure. Lipid and cholesterol levels. These may be checked every 5 years, or more frequently if you are over 90 years old. Skin check. Lung cancer screening. You may have this screening every year starting at age 102 if you have a 30-pack-year history of smoking and currently smoke or have quit within the past 15 years. Fecal occult blood test (FOBT) of the stool. You may have this test every year starting at age 40. Flexible sigmoidoscopy or colonoscopy. You may have a sigmoidoscopy every 5 years or a colonoscopy every 10 years starting at age 11. Hepatitis C blood test. Hepatitis B blood test. Sexually transmitted disease (STD) testing. Diabetes screening. This is done by checking your blood sugar (glucose) after you have not eaten for a while (fasting). You may have this done every 1-3 years. Bone density scan. This is done to screen for osteoporosis. You may have this done starting at age 39. Mammogram. This may be done every 1-2 years. Talk to your health care provider about how often you should have regular mammograms. Talk with your health care provider about your test results, treatment options, and if necessary, the need for more tests. Vaccines  Your health care provider may recommend certain vaccines, such as: Influenza vaccine. This is recommended every year. Tetanus, diphtheria, and acellular pertussis (Tdap, Td) vaccine. You may need a Td booster every 10 years. Zoster vaccine.  You may need this  after age 36. Pneumococcal 13-valent conjugate (PCV13) vaccine. One dose is recommended after age 92. Pneumococcal polysaccharide (PPSV23) vaccine. One dose is recommended after age 55. Talk to your health care provider about which screenings and vaccines you need and how often you need them. This information is not intended to replace advice given to you by your health care provider. Make sure you discuss any questions you have with your health care provider. Document Released: 12/26/2015 Document Revised: 08/18/2016 Document Reviewed: 09/30/2015 Elsevier Interactive Patient Education  2017 [place] Prevention in the Home Falls can cause injuries. They can happen to people of all ages. There are many things you can do to make your home safe and to help prevent falls. What can I do on the outside of my home? Regularly fix the edges of walkways and driveways and fix any cracks. Remove anything that might make you trip as you walk through a door, such as a raised step or threshold. Trim any bushes or trees on the path to your home. Use bright outdoor lighting. Clear any walking paths of anything that might make someone trip, such as rocks or tools. Regularly check to see if handrails are loose or broken. Make sure that both sides of any steps have handrails. Any raised decks and porches should have guardrails on the edges. Have any leaves, snow, or ice cleared regularly. Use sand or salt on walking paths during winter. Clean up any spills in your garage right away. This includes oil or grease spills. What can I do in the bathroom? Use night lights. Install grab bars by the toilet and in the tub and shower. Do not use towel bars as grab bars. Use non-skid mats or decals in the tub or shower. If you need to sit down in the shower, use a plastic, non-slip stool. Keep the floor dry. Clean up any water that spills on the floor as soon as it happens. Remove soap buildup in the tub or  shower regularly. Attach bath mats securely with double-sided non-slip rug tape. Do not have throw rugs and other things on the floor that can make you trip. What can I do in the bedroom? Use night lights. Make sure that you have a light by your bed that is easy to reach. Do not use any sheets or blankets that are too big for your bed. They should not hang down onto the floor. Have a firm chair that has side arms. You can use this for support while you get dressed. Do not have throw rugs and other things on the floor that can make you trip. What can I do in the kitchen? Clean up any spills right away. Avoid walking on wet floors. Keep items that you use a lot in easy-to-reach places. If you need to reach something above you, use a strong step stool that has a grab bar. Keep electrical cords out of the way. Do not use floor polish or wax that makes floors slippery. If you must use wax, use non-skid floor wax. Do not have throw rugs and other things on the floor that can make you trip. What can I do with my stairs? Do not leave any items on the stairs. Make sure that there are handrails on both sides of the stairs and use them. Fix handrails that are broken or loose. Make sure that handrails are as long as the stairways. Check any carpeting to make sure that it is  firmly attached to the stairs. Fix any carpet that is loose or worn. Avoid having throw rugs at the top or bottom of the stairs. If you do have throw rugs, attach them to the floor with carpet tape. Make sure that you have a light switch at the top of the stairs and the bottom of the stairs. If you do not have them, ask someone to add them for you. What else can I do to help prevent falls? Wear shoes that: Do not have high heels. Have rubber bottoms. Are comfortable and fit you well. Are closed at the toe. Do not wear sandals. If you use a stepladder: Make sure that it is fully opened. Do not climb a closed stepladder. Make  sure that both sides of the stepladder are locked into place. Ask someone to hold it for you, if possible. Clearly mark and make sure that you can see: Any grab bars or handrails. First and last steps. Where the edge of each step is. Use tools that help you move around (mobility aids) if they are needed. These include: Canes. Walkers. Scooters. Crutches. Turn on the lights when you go into a dark area. Replace any light bulbs as soon as they burn out. Set up your furniture so you have a clear path. Avoid moving your furniture around. If any of your floors are uneven, fix them. If there are any pets around you, be aware of where they are. Review your medicines with your doctor. Some medicines can make you feel dizzy. This can increase your chance of falling. Ask your doctor what other things that you can do to help prevent falls. This information is not intended to replace advice given to you by your health care provider. Make sure you discuss any questions you have with your health care provider. Document Released: 09/25/2009 Document Revised: 05/06/2016 Document Reviewed: 01/03/2015 Elsevier Interactive Patient Education  2017 [employer].

## 2021-08-13 NOTE — Progress Notes (Signed)
This visit occurred during the SARS-CoV-2 public health emergency.  Safety protocols were in place, including screening questions prior to the visit, additional usage of staff PPE, and extensive cleaning of exam room while observing appropriate contact time as indicated for disinfecting solutions.  Subjective:   Tricia Ferguson is a 87 y.o. female who presents for Medicare Annual (Subsequent) preventive examination.  Review of Systems     Cardiac Risk Factors include: advanced age (>1455men, 50>65 women);hypertension;obesity (BMI >30kg/m2);sedentary lifestyle     Objective:    Today's Vitals   08/13/21 1125  BP: 130/64  Pulse: 75  Temp: 98.3 F (36.8 C)  TempSrc: Oral  SpO2: 95%  Weight: 184 lb 9.6 oz (83.7 kg)  Height: 4' 11.6" (1.514 m)   Body mass index is 36.54 kg/m.  Advanced Directives 08/13/2021 07/16/2020 06/27/2019 09/29/2017  Does Patient Have a Medical Advance Directive? No No No No  Would patient like information on creating a medical advance directive? No - Patient declined No - Patient declined Yes (MAU/Ambulatory/Procedural Areas - Information given) -    Current Medications (verified) Outpatient Encounter Medications as of 08/13/2021  Medication Sig   Acetaminophen (TYLENOL PO) Take by mouth.   Ascorbic Acid (VITAMIN C PO) Take by mouth.   calcium carbonate (TUMS - DOSED IN MG ELEMENTAL CALCIUM) 500 MG chewable tablet Chew 2 tablets by mouth daily.    Cholecalciferol (VITAMIN D3 PO) Take 2,000 Units by mouth daily.    fish oil-omega-3 fatty acids 1000 MG capsule Take 2 g by mouth daily.   Garlic 1000 MG CAPS Take by mouth.   glucosamine-chondroitin 500-400 MG tablet Take 1 tablet by mouth 3 (three) times daily.   levocetirizine (XYZAL) 5 MG tablet TAKE 1 TABLET(5 MG) BY MOUTH EVERY EVENING   lisinopril (ZESTRIL) 5 MG tablet TAKE 1 TABLET(5 MG) BY MOUTH DAILY   Multiple Vitamin (MULTIVITAMIN) tablet Take 1 tablet by mouth daily.   Multiple  Vitamins-Minerals (PRESERVISION AREDS PO) Take by mouth.   pantoprazole (PROTONIX) 40 MG tablet TAKE 1 TABLET(40 MG) BY MOUTH DAILY 30 MINUTES BEFORE BREAKFAST   pantoprazole (PROTONIX) 40 MG tablet TAKE 1 TABLET(40 MG) BY MOUTH DAILY 30 MINUTES BEFORE BREAKFAST   traMADol (ULTRAM) 50 MG tablet Take 1 tablet (50 mg total) by mouth every 6 (six) hours as needed.   No facility-administered encounter medications on file as of 08/13/2021.    Allergies (verified) Amoxicillin, Penicillins, and Potassium-containing compounds   History: Past Medical History:  Diagnosis Date   Chronic kidney disease    Hypertension    Peripheral vascular disease (HCC)    Vitamin D deficiency disease    Past Surgical History:  Procedure Laterality Date   ABDOMINAL HYSTERECTOMY     HEMORRHOID SURGERY     Family History  Problem Relation Age of Onset   Hypertension Mother    Throat cancer Mother    Prostate cancer Father    Tremor Father    Hypertension Sister    Diabetes Sister    Hypertension Brother    Hypertension Sister    Diabetes Sister    Hypertension Sister    Social History   Socioeconomic History   Marital status: Widowed    Spouse name: Not on file   Number of children: 3   Years of education: Not on file   Highest education level: Not on file  Occupational History   Occupation: retired  Tobacco Use   Smoking status: Former    Packs/day: 1.00  Years: 20.00    Pack years: 20.00    Types: Cigarettes    Quit date: 2004    Years since quitting: 18.6   Smokeless tobacco: Never   Tobacco comments:    n/a  Vaping Use   Vaping Use: Never used  Substance and Sexual Activity   Alcohol use: No   Drug use: No   Sexual activity: Not Currently  Other Topics Concern   Not on file  Social History Narrative   Not on file   Social Determinants of Health   Financial Resource Strain: Low Risk    Difficulty of Paying Living Expenses: Not hard at all  Food Insecurity: No Food  Insecurity   Worried About Programme researcher, broadcasting/film/video in the Last Year: Never true   Ran Out of Food in the Last Year: Never true  Transportation Needs: No Transportation Needs   Lack of Transportation (Medical): No   Lack of Transportation (Non-Medical): No  Physical Activity: Inactive   Days of Exercise per Week: 0 days   Minutes of Exercise per Session: 0 min  Stress: No Stress Concern Present   Feeling of Stress : Not at all  Social Connections: Not on file    Tobacco Counseling Counseling given: Not Answered Tobacco comments: n/a   Clinical Intake:  Pre-visit preparation completed: Yes  Pain : No/denies pain     Nutritional Status: BMI > 30  Obese Nutritional Risks: None Diabetes: No  How often do you need to have someone help you when you read instructions, pamphlets, or other written materials from your doctor or pharmacy?: 1 - Never What is the last grade level you completed in school?: 12th grade  Diabetic? no  Interpreter Needed?: No  Information entered by :: NAllen LPN   Activities of Daily Living In your present state of health, do you have any difficulty performing the following activities: 08/13/2021 08/10/2021  Hearing? N N  Vision? N N  Difficulty concentrating or making decisions? N N  Walking or climbing stairs? Y N  Dressing or bathing? N N  Doing errands, shopping? N N  Preparing Food and eating ? N -  Using the Toilet? N -  In the past six months, have you accidently leaked urine? N -  Do you have problems with loss of bowel control? N -  Managing your Medications? N -  Managing your Finances? N -  Housekeeping or managing your Housekeeping? N -  Some recent data might be hidden    Patient Care Team: Dorothyann Peng, MD as PCP - General (Internal Medicine) Janet Berlin, MD as Consulting Physician (Ophthalmology)  Indicate any recent Medical Services you may have received from other than [facility] providers in the past year (date may be  approximate).     Assessment:   This is a routine wellness examination for Tricia Ferguson.  Hearing/Vision screen Vision Screening - Comments:: Regular eye exams, Dr. Burgess Estelle, Dr. Allena Katz  Dietary issues and exercise activities discussed: Current Exercise Habits: The patient does not participate in regular exercise at present   Goals Addressed             This Visit's Progress    Patient Stated       08/13/2021, eat healthy and keep moving       Depression Screen PHQ 2/9 Scores 08/13/2021 08/10/2021 07/16/2020 07/15/2020 02/13/2020 09/06/2019 06/27/2019  PHQ - 2 Score 0 0 0 0 1 0 0  PHQ- 9 Score - - 1 - - - 1  Fall Risk Fall Risk  08/13/2021 08/10/2021 07/16/2020 02/13/2020 09/06/2019  Falls in the past year? 1 1 0 0 0  Comment slipped in the shower - - - -  Number falls in past yr: 0 0 - - -  Comment - slipped getting out the bath tub - - -  Injury with Fall? 0 0 - - -  Risk for fall due to : Impaired mobility;Impaired balance/gait;Medication side effect Impaired balance/gait Impaired balance/gait;Medication side effect - -  Follow up Falls evaluation completed;Education provided;Falls prevention discussed - Falls evaluation completed;Education provided;Falls prevention discussed - -    FALL RISK PREVENTION PERTAINING TO THE HOME:  Any stairs in or around the home? No  If so, are there any without handrails?  N/a Home free of loose throw rugs in walkways, pet beds, electrical cords, etc? Yes  Adequate lighting in your home to reduce risk of falls? Yes   ASSISTIVE DEVICES UTILIZED TO PREVENT FALLS:  Life alert? No  Use of a cane, walker or w/c? Yes  Grab bars in the bathroom? No  Shower chair or bench in shower? No  Elevated toilet seat or a handicapped toilet? No   TIMED UP AND GO:  Was the test performed? No .    Gait slow and steady with assistive device  Cognitive Function:     6CIT Screen 08/13/2021 07/16/2020 06/27/2019  What Year? 0 points 0 points 0 points  What month? 0  points 0 points 0 points  What time? 0 points 0 points 0 points  Count back from 20 0 points 0 points 0 points  Months in reverse 0 points 0 points 0 points  Repeat phrase 0 points 0 points 0 points  Total Score 0 0 0    Immunizations Immunization History  Administered Date(s) Administered   Influenza, High Dose Seasonal PF 09/14/2014, 09/09/2015, 09/13/2016, 09/14/2017   Influenza-Unspecified 08/28/2018   PFIZER(Purple Top)SARS-COV-2 Vaccination 01/19/2020, 02/09/2020, 10/06/2020, 04/02/2021   Pneumococcal Conjugate-13 05/16/2015   Tdap 06/27/2019   Zoster Recombinat (Shingrix) 07/16/2020, 09/13/2020    TDAP status: Up to date  Flu Vaccine status: Due, Education has been provided regarding the importance of this vaccine. Advised may receive this vaccine at local pharmacy or Health Dept. Aware to provide a copy of the vaccination record if obtained from local pharmacy or Health Dept. Verbalized acceptance and understanding.  Pneumococcal vaccine status: Up to date  Covid-19 vaccine status: Completed vaccines  Qualifies for Shingles Vaccine? Yes   Zostavax completed No   Shingrix Completed?: Yes  Screening Tests Health Maintenance  Topic Date Due   INFLUENZA VACCINE  07/13/2021   TETANUS/TDAP  06/26/2029   DEXA SCAN  Completed   COVID-19 Vaccine  Completed   PNA vac Low Risk Adult  Completed   Zoster Vaccines- Shingrix  Completed   HPV VACCINES  Aged Out    Health Maintenance  Health Maintenance Due  Topic Date Due   INFLUENZA VACCINE  07/13/2021    Colorectal cancer screening: No longer required.   Mammogram status: Completed 05/04/2021. Repeat every year  Bone Density status: Completed 07/24/2019.   Lung Cancer Screening: (Low Dose CT Chest recommended if Age 73-80 years, 30 pack-year currently smoking OR have quit w/in 15years.) does not qualify.   Lung Cancer Screening Referral: no  Additional Screening:  Hepatitis C Screening: does not qualify;    Vision Screening: Recommended annual ophthalmology exams for early detection of glaucoma and other disorders of the eye. Is the patient up to  date with their annual eye exam?  Yes  Who is the provider or what is the name of the office in which the patient attends annual eye exams? Dr. Allena Katz, Dr. Burgess Estelle If pt is not established with a provider, would they like to be referred to a provider to establish care? No .   Dental Screening: Recommended annual dental exams for proper oral hygiene  Community Resource Referral / Chronic Care Management: CRR required this visit?  No   CCM required this visit?  No      Plan:     I have personally reviewed and noted the following in the patient's chart:   Medical and social history Use of alcohol, tobacco or illicit drugs  Current medications and supplements including opioid prescriptions.  Functional ability and status Nutritional status Physical activity Advanced directives List of other physicians Hospitalizations, surgeries, and ER visits in previous 12 months Vitals Screenings to include cognitive, depression, and falls Referrals and appointments  In addition, I have reviewed and discussed with patient certain preventive protocols, quality metrics, and best practice recommendations. A written personalized care plan for preventive services as well as general preventive health recommendations were provided to patient.     Barb Merino, LPN   12/14/4578   Nurse Notes:

## 2021-08-20 ENCOUNTER — Encounter: Payer: Self-pay | Admitting: Nurse Practitioner

## 2021-08-20 ENCOUNTER — Ambulatory Visit (INDEPENDENT_AMBULATORY_CARE_PROVIDER_SITE_OTHER): Payer: Medicare Other | Admitting: Nurse Practitioner

## 2021-08-20 ENCOUNTER — Other Ambulatory Visit: Payer: Self-pay

## 2021-08-20 VITALS — BP 110/60 | HR 80 | Temp 97.9°F | Ht 59.0 in | Wt 187.0 lb

## 2021-08-20 DIAGNOSIS — R1013 Epigastric pain: Secondary | ICD-10-CM | POA: Diagnosis not present

## 2021-08-20 DIAGNOSIS — K449 Diaphragmatic hernia without obstruction or gangrene: Secondary | ICD-10-CM

## 2021-08-20 NOTE — Patient Instructions (Signed)
Hernia, Adult   A hernia is the bulging of an organ or tissue through a weak spot in the muscles of the abdomen (abdominal wall). Hernias develop most often near the belly button (navel) or the area where the leg meets the lower abdomen (groin). Common types of hernias include: Incisional hernia. This type bulges through a scar from an abdominal surgery. Umbilical hernia. This type develops near the navel. Inguinal hernia. This type develops in the groin or scrotum. Femoral hernia. This type develops under the groin, in the upper thigh area. Hiatal hernia. This type occurs when part of the stomach slides above the muscle that separates the abdomen from the chest (diaphragm). What are the causes? This condition may be caused by: Heavy lifting. Coughing over a long period of time. Straining to have a bowel movement. Constipation can lead to straining. An incision made during an abdominal surgery. A physical problem that is present at birth (congenital defect). Being overweight or obese. Smoking. Excess fluid in the abdomen. Undescended testicles in males. What are the signs or symptoms? The main symptom is a skin-colored, rounded bulge in the area of the hernia. However, a bulge may not always be present. It may grow bigger or be more visible when you cough or strain (such as when lifting something heavy). A hernia that can be pushed back into the area (is reducible) rarely causes pain. A hernia that cannot be pushed back into the area (is incarcerated) may lose its blood supply (become strangulated). A hernia that is incarcerated may cause: Pain. Fever. Nausea and vomiting. Swelling. Constipation. How is this diagnosed? A hernia may be diagnosed based on: Your symptoms and medical history. A physical exam. Your health care provider may ask you to cough or move in certain ways to see if the hernia becomes visible. Imaging tests, such as: X-rays. Ultrasound. CT scan. How is this  treated? A hernia that is small and painless may not need to be treated. A hernia that is large or painful may be treated with surgery. Inguinal hernias may be treated with surgery to prevent incarceration or strangulation. Strangulated hernias are always treated with surgery because a lack of blood supply to the trapped organ or tissue can cause it to die. Surgery to treat a hernia involves pushing the bulge back into place and repairing the weak area of the muscle or abdominal wall. Follow these instructions at home: Activity Avoid straining. Do not lift anything that is heavier than 10 lb (4.5 kg), or the limit that you are told, until your health care provider says that it is safe. When lifting heavy objects, lift with your leg muscles, not your back muscles. Preventing constipation Take actions to prevent constipation. Constipation leads to straining with bowel movements, which can make a hernia worse or cause a hernia repair to break down. Your health care provider may recommend that you: Drink enough fluid to keep your urine pale yellow. Eat foods that are high in fiber, such as fresh fruits and vegetables, whole grains, and beans. Limit foods that are high in fat and processed sugars, such as fried or sweet foods. Take an over-the-counter or prescription medicine for constipation. General instructions When coughing, try to cough gently. You may try to push the hernia back in place by very gently pressing on it while lying down. Do not try to force the bulge back in if it will not push in easily. If you are overweight, work with your health care provider to lose   weight safely. Do not use any products that contain nicotine or tobacco, such as cigarettes and e-cigarettes. If you need help quitting, ask your health care provider. If you are scheduled for hernia repair, watch your hernia for any changes in shape, size, or color. Tell your health care provider about any changes or new  symptoms. Take over-the-counter and prescription medicines only as told by your health care provider. Keep all follow-up visits as told by your health care provider. This is important. Contact a health care provider if: You develop new pain, swelling, or redness around your hernia. You have signs of constipation, such as: Fewer bowel movements in a week than normal. Difficulty having a bowel movement. Stools that are dry, hard, or larger than normal. Get help right away if: You have a fever. You have abdomen pain that gets worse. You feel nauseous or you vomit. You cannot push the hernia back in place by very gently pressing on it while lying down. Do not try to force the bulge back in if it will not push in easily. The hernia: Changes in shape, size, or color. Feels hard or tender. These symptoms may represent a serious problem that is an emergency. Do not wait to see if the symptoms will go away. Get medical help right away. Call your local emergency services (911 in the [place] Summary A hernia is the bulging of an organ or tissue through a weak spot in the muscles of the abdomen (abdominal wall). The main symptom is a skin-colored, rounded lump (bulge) in the hernia area. However, a bulge may not always be present. It may grow bigger or more visible when you cough or strain (such as when having a bowel movement). A hernia that is small and painless may not need to be treated. A hernia that is large or painful may be treated with surgery. Surgery to treat a hernia involves pushing the bulge back into place and repairing the weak part of the abdomen. This information is not intended to replace advice given to you by your health care provider. Make sure you discuss any questions you have with your health care provider. Document Revised: 03/22/2019 Document Reviewed: 08/31/2017 Elsevier Patient Education  2021 Elsevier Inc.  

## 2021-08-20 NOTE — Progress Notes (Signed)
[facility]	[place] as a Neurosurgeon for [employer], NP.,have documented all relevant documentation on the behalf of [employer], NP,as directed by  Charlesetta Ivory, NP while in the presence of Charlesetta Ivory, NP.  This visit occurred during the SARS-CoV-2 public health emergency.  Safety protocols were in place, including screening questions prior to the visit, additional usage of staff PPE, and extensive cleaning of exam room while observing appropriate contact time as indicated for disinfecting solutions.  Subjective:     Patient ID: Tricia Ferguson , female    DOB: 1934/05/06 , 87 y.o.   MRN: [id number]   No chief complaint on file.   HPI  Pt noticed hernia more within the past few days, in the middle of her chest near her breasts. She states she was diagnosed with it being a hernia over a year ago, the discomfort grows. Pt feels as if her accidentally eating acidic tomatoes happened to make it flare up worse. She takes pantoprazole, Protonix and TUMS for her epigastric discomfort but that doesn't seem to help her a lot.     Past Medical History:  Diagnosis Date   Chronic kidney disease    Hypertension    Peripheral vascular disease (HCC)    Vitamin D deficiency disease      Family History  Problem Relation Age of Onset   Hypertension Mother    Throat cancer Mother    Prostate cancer Father    Tremor Father    Hypertension Sister    Diabetes Sister    Hypertension Brother    Hypertension Sister    Diabetes Sister    Hypertension Sister      Current Outpatient Medications:    Acetaminophen (TYLENOL PO), Take by mouth., Disp: , Rfl:    Ascorbic Acid (VITAMIN C PO), Take by mouth., Disp: , Rfl:    calcium carbonate (TUMS - DOSED IN MG ELEMENTAL CALCIUM) 500 MG chewable tablet, Chew 2 tablets by mouth daily. , Disp: , Rfl:    Cholecalciferol (VITAMIN D3 PO), Take 2,000 Units by mouth daily. , Disp: , Rfl:    fish oil-omega-3 fatty acids 1000 MG  capsule, Take 2 g by mouth daily., Disp: , Rfl:    Garlic 1000 MG CAPS, Take by mouth., Disp: , Rfl:    glucosamine-chondroitin 500-400 MG tablet, Take 1 tablet by mouth 3 (three) times daily., Disp: , Rfl:    levocetirizine (XYZAL) 5 MG tablet, TAKE 1 TABLET(5 MG) BY MOUTH EVERY EVENING, Disp: 90 tablet, Rfl: 1   lisinopril (ZESTRIL) 5 MG tablet, TAKE 1 TABLET(5 MG) BY MOUTH DAILY, Disp: 90 tablet, Rfl: 1   Multiple Vitamin (MULTIVITAMIN) tablet, Take 1 tablet by mouth daily., Disp: , Rfl:    Multiple Vitamins-Minerals (PRESERVISION AREDS PO), Take by mouth., Disp: , Rfl:    pantoprazole (PROTONIX) 40 MG tablet, TAKE 1 TABLET(40 MG) BY MOUTH DAILY 30 MINUTES BEFORE BREAKFAST, Disp: 30 tablet, Rfl: 11   pantoprazole (PROTONIX) 40 MG tablet, TAKE 1 TABLET(40 MG) BY MOUTH DAILY 30 MINUTES BEFORE BREAKFAST, Disp: 30 tablet, Rfl: 11   traMADol (ULTRAM) 50 MG tablet, Take 1 tablet (50 mg total) by mouth every 6 (six) hours as needed., Disp: 30 tablet, Rfl: 0   Allergies  Allergen Reactions   Amoxicillin Itching   Penicillins Hives   Potassium-Containing Compounds Itching     Review of Systems  Constitutional: Negative.  Negative for chills and fatigue.  HENT:  Negative for congestion.   Respiratory: Negative.  Negative for  shortness of breath and wheezing.   Cardiovascular: Negative.   Gastrointestinal:  Negative for diarrhea and nausea.       Epigastric pain/discomfort  Neurological: Negative.  Negative for headaches.  Psychiatric/Behavioral: Negative.      Today's Vitals   08/20/21 0959  BP: 110/60  Pulse: 80  Temp: 97.9 F (36.6 C)  Weight: 187 lb (84.8 kg)  Height: 4\' 11"  (1.499 m)   Body mass index is 37.77 kg/m.  Wt Readings from Last 3 Encounters:  08/20/21 187 lb (84.8 kg)  08/13/21 184 lb 9.6 oz (83.7 kg)  08/10/21 187 lb 12.8 oz (85.2 kg)    Objective:  Physical Exam Constitutional:      Appearance: Normal appearance.  HENT:     Head: Normocephalic and  atraumatic.  Cardiovascular:     Rate and Rhythm: Normal rate and regular rhythm.     Pulses: Normal pulses.     Heart sounds: Normal heart sounds. No murmur heard. Pulmonary:     Effort: Pulmonary effort is normal. No respiratory distress.     Breath sounds: Normal breath sounds. No wheezing.  Abdominal:     Hernia: A hernia is present.     Comments: Hernia below the sternum   Skin:    General: Skin is warm and dry.  Neurological:     Mental Status: She is alert and oriented to person, place, and time.        Assessment And Plan:     1. Hiatal hernia - Ambulatory rPt. Has been taking Protonix, pantoprazole and TUMS for many years with no significant relief. She would like to be evaluated with a specialist for her epigastric discomfort/irritation and hernia. Advise patient to avoid lifting and stretching heavy things.  -Ambulatory referral to General Surgery - Ambulatory referral to Gastroenterology  2. Epigastric discomfort Pt. Has been taking Protonix, pantoprazole and TUMS for many years with no significant relief. She would like to be evaluated with a specialist for her epigastric discomfort/irritation and hernia.  -Advised the patient to avoid citrus foods, acidic foods, chocolate, fatty foods, spicy foods  - Ambulatory referral to General Surgery - Ambulatory referral to Gastroenterology   The patient was encouraged to call or send a message through MyChart for any questions or concerns.   Follow up: if symptoms persist or do not get better.   Side effects and appropriate use of all the medication(s) were discussed with the patient today. Patient advised to use the medication(s) as directed by their healthcare provider. The patient was encouraged to read, review, and understand all associated package inserts and contact our office with any questions or concerns. The patient accepts the risks of the treatment plan and had an opportunity to ask questions.   Patient was given  opportunity to ask questions. Patient verbalized understanding of the plan and was able to repeat key elements of the plan. All questions were answered to their satisfaction.  Raman Parrish Daddario, DNP   I, Raman Anais Denslow have reviewed all documentation for this visit. The documentation on 08/20/21 for the exam, diagnosis, procedures, and orders are all accurate and complete.    IF YOU HAVE BEEN REFERRED TO A SPECIALIST, IT MAY TAKE 1-2 WEEKS TO SCHEDULE/PROCESS THE REFERRAL. IF YOU HAVE NOT HEARD FROM US/SPECIALIST IN TWO WEEKS, PLEASE GIVE 10/20/21 A CALL AT [phone].   THE PATIENT IS ENCOURAGED TO PRACTICE SOCIAL DISTANCING DUE TO THE COVID-19 PANDEMIC.

## 2021-08-31 ENCOUNTER — Other Ambulatory Visit: Payer: Self-pay

## 2021-08-31 ENCOUNTER — Encounter: Payer: Self-pay | Admitting: Surgery

## 2021-08-31 ENCOUNTER — Ambulatory Visit: Payer: Medicare Other | Admitting: Surgery

## 2021-08-31 VITALS — BP 134/70 | HR 72 | Temp 98.3°F | Ht 60.0 in | Wt 187.0 lb

## 2021-08-31 DIAGNOSIS — K449 Diaphragmatic hernia without obstruction or gangrene: Secondary | ICD-10-CM

## 2021-08-31 NOTE — Progress Notes (Signed)
Patient ID: Tricia Ferguson, female   DOB: 10-21-1934, 87 y.o.   MRN: [id number]  HPI Tricia Ferguson is a 87 y.o. female seen in consultation at the request of Mr. Celesta Gentile NP. She is referred in consultation for recalcitrant reflux.  He states that she has had apparently a hiatal hernia for more than 30 years.  She does have significant reflux symptoms that get exacerbated by certain acid meals including tomatoes.  She does experience heartburn.  Some very mild dysphagia that I think this is related more to cervical swallow mechanism than peristalsis of the esophagus percent. He apparently did have an upper endoscopy by Dr. Mechele Collin decades ago.  No results are available. CBC is shows mild chronic anemia hemoglobin of 10.9.  Rest of the CBC is normal CMP be was completely normal. Did have a recent mammogram that have personally reviewed showing no evidence of any concerning lesions. He is independent and is able to drive for short distances.  Does activities of daily living.  Her daughter is very supportive.  HPI  Past Medical History:  Diagnosis Date   Chronic kidney disease    Hypertension    Peripheral vascular disease (HCC)    Vitamin D deficiency disease     Past Surgical History:  Procedure Laterality Date   ABDOMINAL HYSTERECTOMY     HEMORRHOID SURGERY      Family History  Problem Relation Age of Onset   Hypertension Mother    Throat cancer Mother    Prostate cancer Father    Tremor Father    Hypertension Sister    Diabetes Sister    Hypertension Brother    Hypertension Sister    Diabetes Sister    Hypertension Sister     Social History Social History   Tobacco Use   Smoking status: Former    Packs/day: 1.00    Years: 20.00    Pack years: 20.00    Types: Cigarettes    Quit date: 2004    Years since quitting: 18.7   Smokeless tobacco: Never   Tobacco comments:    n/a  Vaping Use   Vaping Use: Never used  Substance Use Topics   Alcohol  use: No   Drug use: No    Allergies  Allergen Reactions   Amoxicillin Itching   Penicillins Hives   Potassium-Containing Compounds Itching    Current Outpatient Medications  Medication Sig Dispense Refill   Acetaminophen (TYLENOL PO) Take by mouth.     Ascorbic Acid (VITAMIN C PO) Take by mouth.     calcium carbonate (TUMS - DOSED IN MG ELEMENTAL CALCIUM) 500 MG chewable tablet Chew 2 tablets by mouth daily.      Cholecalciferol (VITAMIN D3 PO) Take 2,000 Units by mouth daily.      fish oil-omega-3 fatty acids 1000 MG capsule Take 2 g by mouth daily.     Garlic 1000 MG CAPS Take by mouth.     glucosamine-chondroitin 500-400 MG tablet Take 1 tablet by mouth 3 (three) times daily.     levocetirizine (XYZAL) 5 MG tablet TAKE 1 TABLET(5 MG) BY MOUTH EVERY EVENING 90 tablet 1   lisinopril (ZESTRIL) 5 MG tablet TAKE 1 TABLET(5 MG) BY MOUTH DAILY 90 tablet 1   Multiple Vitamin (MULTIVITAMIN) tablet Take 1 tablet by mouth daily.     Multiple Vitamins-Minerals (PRESERVISION AREDS PO) Take by mouth.     pantoprazole (PROTONIX) 40 MG tablet TAKE 1 TABLET(40 MG) BY MOUTH DAILY 30 MINUTES BEFORE  BREAKFAST 30 tablet 11   traMADol (ULTRAM) 50 MG tablet Take 1 tablet (50 mg total) by mouth every 6 (six) hours as needed. 30 tablet 0   No current facility-administered medications for this visit.     Review of Systems Full ROS  was asked and was negative except for the information on the HPI  Physical Exam Blood pressure 134/70, pulse 72, temperature 98.3 F (36.8 C), temperature source Oral, height 5' (1.524 m), weight 187 lb (84.8 kg), SpO2 92 %. CONSTITUTIONAL: NAD EYES: Pupils are equal, round, Sclera are non-icteric. EARS, NOSE, MOUTH AND THROAT: She is wearing a mask, Hearing is intact to voice. LYMPH NODES:  Lymph nodes in the neck are normal. RESPIRATORY:  Lungs are clear. There is normal respiratory effort, with equal breath sounds bilaterally, and without pathologic use of accessory  muscles. CARDIOVASCULAR: Heart is regular without murmurs, gallops, or rubs. GI: The abdomen is soft, nontender, and nondistended. There are no palpable masses. There is no hepatosplenomegaly. There are normal bowel sounds / Prior lower midline laparotomy scar. GU: Rectal deferred.   MUSCULOSKELETAL: Normal muscle strength and tone. No cyanosis or edema.   SKIN: Turgor is good and there are no pathologic skin lesions or ulcers. NEUROLOGIC: Motor and sensation is grossly normal. Cranial nerves are grossly intact. PSYCH:  Oriented to person, place and time. Affect is normal.  Data Reviewed  I have personally reviewed the patient's imaging, laboratory findings and medical records.    Assessment/Plan 87 year old female very functional now with recalcitrant reflux and questionable hiatal hernia.  She wishes to proceed with further work-up.  We will obtain a CT scan of the abdomen and pelvis as well as a barium swallow.  We will make appropriate referral for endoscopic evaluation by Dr. Tobi Bastos in this patient.  Once we have gathered the data we will talk to her again about physical therapy of performing robotic hiatal hernia repair. Currently no need for urgent interventions at this time.  Time spent with the patient was 50 minutes, with more than 50% of the time spent in face-to-face education, counseling and care coordination.     Sterling Big, MD FACS General Surgeon 08/31/2021, 2:59 PM

## 2021-08-31 NOTE — Patient Instructions (Addendum)
A referral has been placed to Dr. Tobi Bastos at [facility] in [place]. They will call you with an appointment.   Your CT is scheduled for 09/07/2021 at 11:15 am arrival at the Outpatient Imaging on [address]. Nothing to eat or drink 4 hours prior. Pick up prep on Friday at Radiology Department.   Your Barium Swallow test is scheduled for 09/04/2021 at 10:45 am arrival at [facility]. No eating or drinking 3 hours prior.   If you have any concerns or questions, please feel free to call our office. See follow up appointment below  Hiatal Hernia  A hiatal hernia occurs when part of the stomach slides above the muscle that separates the abdomen from the chest (diaphragm). A person can be born with a hiatal hernia (congenital), or it may develop over time. In almost all cases of hiatal hernia, only the top part of the stomach pushes through the diaphragm. Many people have a hiatal hernia with no symptoms. The larger the hernia, the more likely it is that you will have symptoms. In some cases, a hiatal hernia allows stomach acid to flow back into the tube that carries food from your mouth to your stomach (esophagus). This may cause heartburn symptoms. Severe heartburn symptoms may mean that you have developed a condition called gastroesophageal reflux disease (GERD). What are the causes? This condition is caused by a weakness in the opening (hiatus) where the esophagus passes through the diaphragm to attach to the upper part of the stomach. A person may be born with a weakness in the hiatus, or a weakness can develop over time. What increases the risk? This condition is more likely to develop in: Older people. Age is a major risk factor for a hiatal hernia, especially if you are over the age of 43. Pregnant women. People who are overweight. People who have frequent constipation. What are the signs or symptoms? Symptoms of this condition usually develop in the form of GERD  symptoms. Symptoms include: Heartburn. Belching. Indigestion. Trouble swallowing. Coughing or wheezing. Sore throat. Hoarseness. Chest pain. Nausea and vomiting. How is this diagnosed? This condition may be diagnosed during testing for GERD. Tests that may be done include: X-rays of your stomach or chest. An upper gastrointestinal (GI) series. This is an X-ray exam of your GI tract that is taken after you swallow a chalky liquid that shows up clearly on the X-ray. Endoscopy. This is a procedure to look into your stomach using a thin, flexible tube that has a tiny camera and light on the end of it. How is this treated? This condition may be treated by: Dietary and lifestyle changes to help reduce GERD symptoms. Medicines. These may include: Over-the-counter antacids. Medicines that make your stomach empty more quickly. Medicines that block the production of stomach acid (H2 blockers). Stronger medicines to reduce stomach acid (proton pump inhibitors). Surgery to repair the hernia, if other treatments are not helping. If you have no symptoms, you may not need treatment. Follow these instructions at home: Lifestyle and activity Do not use any products that contain nicotine or tobacco, such as cigarettes and e-cigarettes. If you need help quitting, ask your health care provider. Try to achieve and maintain a healthy body weight. Avoid putting pressure on your abdomen. Anything that puts pressure on your abdomen increases the amount of acid that may be pushed up into your esophagus. Avoid bending over, especially after eating. Raise the head of your bed by putting blocks under the legs.  This keeps your head and esophagus higher than your stomach. Do not wear tight clothing around your chest or stomach. Try not to strain when having a bowel movement, when urinating, or when lifting heavy objects. Eating and drinking Avoid foods that can worsen GERD symptoms. These may include: Fatty  foods, like fried foods. Citrus fruits, like oranges or lemon. Other foods and drinks that contain acid, like orange juice or tomatoes. Spicy food. Chocolate. Eat frequent small meals instead of three large meals a day. This helps prevent your stomach from getting too full. Eat slowly. Do not lie down right after eating. Do not eat 1-2 hours before bed. Do not drink beverages with caffeine. These include cola, coffee, cocoa, and tea. Do not drink alcohol. General instructions Take over-the-counter and prescription medicines only as told by your health care provider. Keep all follow-up visits as told by your health care provider. This is important. Contact a health care provider if: Your symptoms are not controlled with medicines or lifestyle changes. You are having trouble swallowing. You have coughing or wheezing that will not go away. Get help right away if: Your pain is getting worse. Your pain spreads to your arms, neck, jaw, teeth, or back. You have shortness of breath. You sweat for no reason. You feel sick to your stomach (nauseous) or you vomit. You vomit blood. You have bright red blood in your stools. You have black, tarry stools. Summary A hiatal hernia occurs when part of the stomach slides above the muscle that separates the abdomen from the chest (diaphragm). A person may be born with a weakness in the hiatus, or a weakness can develop over time. Symptoms of hiatal hernia may include heartburn, trouble swallowing, or sore throat. Management of hiatal hernia includes eating frequent small meals instead of three large meals a day. Get help right away if you vomit blood, have bright red blood in your stools, or have black, tarry stools. This information is not intended to replace advice given to you by your health care provider. Make sure you discuss any questions you have with your health care provider. Document Revised: 10/30/2020 Document Reviewed: 10/30/2020 Elsevier  Patient Education  2022 [employer]. .

## 2021-09-04 ENCOUNTER — Ambulatory Visit
Admission: RE | Admit: 2021-09-04 | Discharge: 2021-09-04 | Disposition: A | Discharge status: Home / Self Care | Payer: Medicare Other | Source: Ambulatory Visit | Attending: Surgery | Admitting: Surgery

## 2021-09-04 ENCOUNTER — Other Ambulatory Visit: Payer: Self-pay

## 2021-09-04 DIAGNOSIS — K449 Diaphragmatic hernia without obstruction or gangrene: Secondary | ICD-10-CM

## 2021-09-04 DIAGNOSIS — K219 Gastro-esophageal reflux disease without esophagitis: Secondary | ICD-10-CM | POA: Diagnosis not present

## 2021-09-07 ENCOUNTER — Ambulatory Visit
Admission: RE | Admit: 2021-09-07 | Discharge: 2021-09-07 | Disposition: A | Discharge status: Home / Self Care | Payer: Medicare Other | Source: Ambulatory Visit | Attending: Surgery | Admitting: Surgery

## 2021-09-07 ENCOUNTER — Telehealth: Payer: Self-pay

## 2021-09-07 ENCOUNTER — Other Ambulatory Visit: Payer: Self-pay

## 2021-09-07 DIAGNOSIS — K449 Diaphragmatic hernia without obstruction or gangrene: Secondary | ICD-10-CM | POA: Diagnosis not present

## 2021-09-07 DIAGNOSIS — M4319 Spondylolisthesis, multiple sites in spine: Secondary | ICD-10-CM | POA: Diagnosis not present

## 2021-09-07 DIAGNOSIS — K7689 Other specified diseases of liver: Secondary | ICD-10-CM | POA: Diagnosis not present

## 2021-09-07 DIAGNOSIS — N281 Cyst of kidney, acquired: Secondary | ICD-10-CM | POA: Diagnosis not present

## 2021-09-07 MED ORDER — IOHEXOL 350 MG/ML SOLN
80.0000 mL | Freq: Once | INTRAVENOUS | Status: AC | PRN
  Administered 2021-09-07: 80 mL via INTRAVENOUS

## 2021-09-07 NOTE — Telephone Encounter (Signed)
Message left for the patient per Dr Everlene Farrier her Swallow study showed some GERD and a small hiatal hernia. Follow up as scheduled with Dr Everlene Farrier.

## 2021-09-16 DIAGNOSIS — Z23 Encounter for immunization: Secondary | ICD-10-CM | POA: Diagnosis not present

## 2021-09-30 ENCOUNTER — Other Ambulatory Visit: Payer: Self-pay

## 2021-09-30 ENCOUNTER — Ambulatory Visit: Payer: Medicare Other | Admitting: Surgery

## 2021-10-07 DIAGNOSIS — H353221 Exudative age-related macular degeneration, left eye, with active choroidal neovascularization: Secondary | ICD-10-CM | POA: Diagnosis not present

## 2021-10-29 ENCOUNTER — Ambulatory Visit: Payer: Medicare Other | Admitting: Gastroenterology

## 2021-11-17 ENCOUNTER — Telehealth: Payer: Self-pay

## 2021-11-17 ENCOUNTER — Ambulatory Visit (INDEPENDENT_AMBULATORY_CARE_PROVIDER_SITE_OTHER): Payer: Medicare Other | Admitting: Gastroenterology

## 2021-11-17 ENCOUNTER — Other Ambulatory Visit: Payer: Self-pay

## 2021-11-17 ENCOUNTER — Encounter: Payer: Self-pay | Admitting: Gastroenterology

## 2021-11-17 VITALS — BP 147/72 | HR 82 | Temp 98.3°F | Wt 187.0 lb

## 2021-11-17 DIAGNOSIS — R002 Palpitations: Secondary | ICD-10-CM | POA: Diagnosis not present

## 2021-11-17 DIAGNOSIS — R1310 Dysphagia, unspecified: Secondary | ICD-10-CM

## 2021-11-17 DIAGNOSIS — K219 Gastro-esophageal reflux disease without esophagitis: Secondary | ICD-10-CM | POA: Diagnosis not present

## 2021-11-17 NOTE — Patient Instructions (Addendum)
We will send a referral to [facility] cardiology. They will call you to schedule an appointment.  Once you see them and give you the okay, we will schedule you an EGD.    Food Choices for Gastroesophageal Reflux Disease, Adult When you have gastroesophageal reflux disease (GERD), the foods you eat and your eating habits are very important. Choosing the right foods can help ease your discomfort. Think about working with a food expert (dietitian) to help you make good choices. What are tips for following this plan? Reading food labels Look for foods that are low in saturated fat. Foods that may help with your symptoms include: Foods that have less than 5% of daily value (DV) of fat. Foods that have 0 grams of trans fat. Cooking Do not fry your food. Cook your food by baking, steaming, grilling, or broiling. These are all methods that do not need a lot of fat for cooking. To add flavor, try to use herbs that are low in spice and acidity. Meal planning  Choose healthy foods that are low in fat, such as: Fruits and vegetables. Whole grains. Low-fat dairy products. Lean meats, fish, and poultry. Eat small meals often instead of eating 3 large meals each day. Eat your meals slowly in a place where you are relaxed. Avoid bending over or lying down until 2-3 hours after eating. Limit high-fat foods such as fatty meats or fried foods. Limit your intake of fatty foods, such as oils, butter, and shortening. Avoid the following as told by your doctor: Foods that cause symptoms. These may be different for different people. Keep a food diary to keep track of foods that cause symptoms. Alcohol. Drinking a lot of liquid with meals. Eating meals during the 2-3 hours before bed. Lifestyle Stay at a healthy weight. Ask your doctor what weight is healthy for you. If you need to lose weight, work with your doctor to do so safely. Exercise for at least 30 minutes on 5 or more days each week, or as told  by your doctor. Wear loose-fitting clothes. Do not smoke or use any products that contain nicotine or tobacco. If you need help quitting, ask your doctor. Sleep with the head of your bed higher than your feet. Use a wedge under the mattress or blocks under the bed frame to raise the head of the bed. Chew sugar-free gum after meals. What foods should eat? Eat a healthy, well-balanced diet of fruits, vegetables, whole grains, low-fat dairy products, lean meats, fish, and poultry. Each person is different. Foods that may cause symptoms in one person may not cause any symptoms in another person. Work with your doctor to find foods that are safe for you. The items listed above may not be a complete list of what you can eat and drink. Contact a food expert for more options. What foods should I avoid? Limiting some of these foods may help in managing the symptoms of GERD. Everyone is different. Talk with a food expert or your doctor to help you find the exact foods to avoid, if any. Fruits Any fruits prepared with added fat. Any fruits that cause symptoms. For some people, this may include citrus fruits, such as oranges, grapefruit, pineapple, and lemons. Vegetables Deep-fried vegetables. [place] fries. Any vegetables prepared with added fat. Any vegetables that cause symptoms. For some people, this may include tomatoes and tomato products, chili peppers, onions and garlic, and horseradish. Grains Pastries or quick breads with added fat. Meats and other proteins  High-fat meats, such as fatty beef or pork, hot dogs, ribs, ham, sausage, salami, and bacon. Fried meat or protein, including fried fish and fried chicken. Nuts and nut butters, in large amounts. Dairy Whole milk and chocolate milk. Sour cream. Cream. Ice cream. Cream cheese. Milkshakes. Fats and oils Butter. Margarine. Shortening. Ghee. Beverages Coffee and tea, with or without caffeine. Carbonated beverages. Sodas. Energy drinks. Fruit  juice made with acidic fruits, such as orange or grapefruit. Tomato juice. Alcoholic drinks. Sweets and desserts Chocolate and cocoa. Donuts. Seasonings and condiments Pepper. Peppermint and spearmint. Added salt. Any condiments, herbs, or seasonings that cause symptoms. For some people, this may include curry, hot sauce, or vinegar-based salad dressings. The items listed above may not be a complete list of what you should not eat and drink. Contact a food expert for more options. Questions to ask your doctor Diet and lifestyle changes are often the first steps that are taken to manage symptoms of GERD. If diet and lifestyle changes do not help, talk with your doctor about taking medicines. Where to find more information [employer] for Gastrointestinal [employer]: aboutgerd.org Summary When you have GERD, food and lifestyle choices are very important in easing your symptoms. Eat small meals often instead of 3 large meals a day. Eat your meals slowly and in a place where you are relaxed. Avoid bending over or lying down until 2-3 hours after eating. Limit high-fat foods such as fatty meats or fried foods. This information is not intended to replace advice given to you by your health care provider. Make sure you discuss any questions you have with your health care provider. Document Revised: 06/09/2020 Document Reviewed: 06/09/2020 Elsevier Patient Education  2022 [employer].

## 2021-11-17 NOTE — Telephone Encounter (Signed)
Called patient to let her know that she has an appointment with [facility] cardiovascular at [facility] [phone] on 11/19/2021 at 1:30 PM for her palpitations. Once she sees them, we will need cardiac clearance so we could schedule patient an EGD.

## 2021-11-17 NOTE — Progress Notes (Signed)
Wyline Mood MD, MRCP(U.K) [address]  [address]  [place], [place] [address]  Main: [phone]  Fax: [phone]   Gastroenterology Consultation  Referring Provider:     Leafy Ro, MD Primary Care Physician:  Dorothyann Peng, MD Primary Gastroenterologist:  Dr. Wyline Mood  Reason for Consultation:     Hiatal Hernia        HPI:   Tricia Ferguson is a 87 y.o. y/o female here today to see me for a hiatal hernia requiring endoscopy.   Seen by Dr Everlene Farrier in the clinic , CT abdomen and pelvis showed no significant hernia on 09/07/2021 and barium swallow showed a small hiatal hernia and mild reflux.  She states that she was diagnosed with a hiatal hernia when she was in her 60s.  Has had reflux for many years.  Takes Protonix 20 mg once a day first thing in the morning on empty stomach.  She states that over the past 3 months she is having some difficulty with food getting stuck in her throat.  Also having issues with palpitations but denies any chest pain.  Has not seen a cardiologist.  Complains of heartburn despite taking the medications.  Past Medical History:  Diagnosis Date   Chronic kidney disease    Hypertension    Peripheral vascular disease (HCC)    Vitamin D deficiency disease     Past Surgical History:  Procedure Laterality Date   ABDOMINAL HYSTERECTOMY     HEMORRHOID SURGERY      Prior to Admission medications   Medication Sig Start Date End Date Taking? Authorizing Provider  Acetaminophen (TYLENOL PO) Take by mouth.    [provider]  Ascorbic Acid (VITAMIN C PO) Take by mouth.    [provider]  calcium carbonate (TUMS - DOSED IN MG ELEMENTAL CALCIUM) 500 MG chewable tablet Chew 2 tablets by mouth daily.     [provider]  cetirizine (ZYRTEC) 10 MG tablet Take 1 tablet by mouth daily.    [provider]  Cholecalciferol (VITAMIN D3 PO) 1 cap PO QD    [provider]  Garlic 1000 MG CAPS Take by  mouth.    [provider]  glucosamine-chondroitin 500-400 MG tablet Take 1 tablet by mouth 3 (three) times daily.    [provider]  lisinopril (ZESTRIL) 5 MG tablet TAKE 1 TABLET(5 MG) BY MOUTH DAILY 07/20/21   Dorothyann Peng, MD  Multiple Vitamin (MULTIVITAMIN) tablet Take 1 tablet by mouth daily.    [provider]  Multiple Vitamins-Minerals (PRESERVISION AREDS PO) Take by mouth.    [provider]  Omega-3 Fatty Acids (FISH OIL) 1000 MG CAPS Take 1 tablet by mouth daily.    [provider]  pantoprazole (PROTONIX) 40 MG tablet TAKE 1 TABLET(40 MG) BY MOUTH DAILY 30 MINUTES BEFORE BREAKFAST 05/25/21   Dorothyann Peng, MD  traMADol (ULTRAM) 50 MG tablet Take 1 tablet (50 mg total) by mouth every 6 (six) hours as needed. 08/10/21 08/10/22  Dorothyann Peng, MD    Family History  Problem Relation Age of Onset   Hypertension Mother    Throat cancer Mother    Prostate cancer Father    Tremor Father    Hypertension Sister    Diabetes Sister    Hypertension Brother    Hypertension Sister    Diabetes Sister    Hypertension Sister      Social History   Tobacco Use   Smoking status:  Former    Packs/day: 1.00    Years: 20.00    Pack years: 20.00    Types: Cigarettes    Quit date: 2004    Years since quitting: 18.9   Smokeless tobacco: Never   Tobacco comments:    n/a  Vaping Use   Vaping Use: Never used  Substance Use Topics   Alcohol use: No   Drug use: No    Allergies as of 11/17/2021 - Review Complete 11/17/2021  Allergen Reaction Noted   Amoxicillin Itching 02/20/2016   Penicillins Hives 02/20/2016   Potassium-containing compounds Itching 02/20/2016    Review of Systems:    All systems reviewed and negative except where noted in HPI.   Physical Exam:  BP (!) 147/72   Pulse 82   Temp 98.3 F (36.8 C) (Oral)   Wt 187 lb (84.8 kg)   BMI 36.52 kg/m  No LMP recorded. Patient has had a hysterectomy. Psych:  Alert and  cooperative. Normal mood and affect. General:   Alert,  Well-developed, well-nourished, pleasant and cooperative in NAD Head:  Normocephalic and atraumatic. Eyes:  Sclera clear, no icterus.   Conjunctiva pink. Ears:  Normal auditory acuity. Lungs:  Respirations even and unlabored.  Clear throughout to auscultation.   No wheezes, crackles, or rhonchi. No acute distress. Heart:  Regular rate and rhythm; no murmurs, clicks, rubs, or gallops. Abdomen:  Normal bowel sounds.  No bruits.  Soft, non-tender and non-distended without masses, hepatosplenomegaly or hernias noted.  No guarding or rebound tenderness.    Neurologic:  Alert and oriented x3;  grossly normal neurologically. Psych:  Alert and cooperative. Normal mood and affect.  Imaging Studies: No results found.  Assessment and Plan:   Tricia Ferguson is a 87 y.o. y/o female has been referred for a hiatal hernia requiring an EGD.  It appears to have reflux symptoms for many years.  No recent endoscopy.  Complains of palpitations at this point of time.  Not seen a cardiologist.  Also having some issues with dysphagia.   Plan  EGD to evaluate for acid reflux as well as for dysphagia after cardiology evaluation for palpitations. Patient information about acid reflux and counseled about the same.  Suggested to use a wedge pillow picture of a wedge pillow provided 3.  Suggest increase Prilosec to 20 mg twice a day  I have discussed alternative options, risks & benefits,  which include, but are not limited to, bleeding, infection, perforation,respiratory complication & drug reaction.  The patient agrees with this plan & written consent will be obtained.     Follow up in 8 to 12 weeks Dr Wyline Mood MD,MRCP(U.K)

## 2021-11-18 NOTE — Progress Notes (Signed)
Primary Physician/Referring:  Dorothyann Peng, MD  Patient ID: Tricia Ferguson, female    DOB: 1933/12/23, 87 y.o.   MRN: [id number]  Chief Complaint  Patient presents with   Palpitations   Follow-up   Surgical Clearance   HPI:    Tricia Ferguson  is a 87 y.o. female  with hypertension, GERD, originally evaluated by [place] for palpitations.  Patient denies history of diabetes, hyperlipidemia, tobacco use.  Patient was last seen in our office 09/26/2019 for palpitations, at which time cardiac monitor revealed PVCs and symptoms significantly improved, she was therefore advised to follow-up as needed.  Patient is now referred back to our office for preoperative risk stratification as she is scheduled for upcoming EGD.  Patient reports she continues to have occasional palpitations which are unchanged compared to previous.  However she also reports chest discomfort which she describes as "tightness" particularly prior to meals and with exertion.  She reports exertional dyspnea which seems to be worse over the last several months.  Denies orthopnea, PND, leg edema.  She does some light exercises and stretching in the morning otherwise is relatively inactive.  Past Medical History:  Diagnosis Date   Chronic kidney disease    Hypertension    Peripheral vascular disease (HCC)    Vitamin D deficiency disease    Past Surgical History:  Procedure Laterality Date   ABDOMINAL HYSTERECTOMY     HEMORRHOID SURGERY     Family History  Problem Relation Age of Onset   Hypertension Mother    Throat cancer Mother    Prostate cancer Father    Tremor Father    Hypertension Sister    Diabetes Sister    Hypertension Brother    Hypertension Sister    Diabetes Sister    Hypertension Sister     Social History   Tobacco Use   Smoking status: Former    Packs/day: 1.00    Years: 20.00    Pack years: 20.00    Types: Cigarettes    Quit date: 2004    Years since quitting: 18.9    Smokeless tobacco: Never   Tobacco comments:    n/a  Substance Use Topics   Alcohol use: No   Marital Status: Widowed   ROS  Review of Systems  Constitutional: Negative for malaise/fatigue and weight gain.  Cardiovascular:  Positive for chest pain, dyspnea on exertion and palpitations. Negative for claudication, leg swelling, near-syncope, orthopnea, paroxysmal nocturnal dyspnea and syncope.  Neurological:  Negative for dizziness.   Objective  Blood pressure 138/73, pulse 71, height 5' (1.524 m), weight 188 lb 6.4 oz (85.5 kg), SpO2 99 %.  Vitals with BMI 11/19/2021 11/17/2021 08/31/2021  Height 5\' 0"  - 5\' 0"   Weight 188 lbs 6 oz 187 lbs 187 lbs  BMI 36.79 - 36.52  Systolic 138 147  Diastolic 73 72 70  Pulse 71 82 72      Physical Exam Vitals reviewed.  Constitutional:      Appearance: She is obese.  HENT:     Head: Normocephalic and atraumatic.  Cardiovascular:     Rate and Rhythm: Normal rate and regular rhythm.     Pulses: Intact distal pulses.     Heart sounds: S1 normal and S2 normal. No murmur heard.   No gallop.  Pulmonary:     Effort: Pulmonary effort is normal. No respiratory distress.     Breath sounds: No wheezing, rhonchi or rales.  Musculoskeletal:     Right  lower leg: Edema (1+ pitting) present.     Left lower leg: Edema (1+ pitting) present.  Neurological:     Mental Status: She is alert.    Laboratory examination:   Recent Labs    01/20/21 1604 08/10/21 1217  NA 145* 140  K 4.4 4.4  CL 108* 105  CO2 23 22  GLUCOSE 94 83  BUN 26 26  CREATININE 0.88 0.98  CALCIUM 9.1 9.4  GFRNONAA 60  --   GFRAA 69  --    CrCl cannot be calculated (Patient's most recent lab result is older than the maximum 21 days allowed.).  CMP Latest Ref Rng & Units 08/10/2021 01/20/2021 07/16/2020  Glucose 65 - 99 mg/dL 83 94 87  BUN 8 - 27 mg/dL 26 26 25   Creatinine 0.57 - 1.00 mg/dL 0.98 0.88 1.00  Sodium 134 - 144 mmol/L 140 145(H) 139  Potassium 3.5 - 5.2 mmol/L  4.4 4.4 4.1  Chloride 96 - 106 mmol/L 105 108(H) 100  CO2 20 - 29 mmol/L 22 23 23   Calcium 8.7 - 10.3 mg/dL 9.4 9.1 9.8  Total Protein 6.0 - 8.5 g/dL 7.1 6.6 7.4  Total Bilirubin 0.0 - 1.2 mg/dL 0.4 0.3 0.4  Alkaline Phos 44 - 121 IU/L 99 89 97  AST 0 - 40 IU/L 23 19 19   ALT 0 - 32 IU/L 16 16 16    CBC Latest Ref Rng & Units 08/10/2021 01/20/2021 07/16/2020  WBC 3.4 - 10.8 x10E3/uL 8.5 9.6 11.2(H)  Hemoglobin 11.1 - 15.9 g/dL 10.9(L) 10.9(L) 10.3(L)  Hematocrit 34.0 - 46.6 % 34.6 33.6(L) 32.3(L)  Platelets 150 - 450 x10E3/uL 249 257 326    Lipid Panel Recent Labs    08/10/21 1217  CHOL 159  TRIG 56  LDLCALC 72  HDL 75  CHOLHDL 2.1    HEMOGLOBIN A1C Lab Results  Component Value Date   HGBA1C 5.4 07/16/2020   TSH No results for input(s): TSH in the last 8760 hours.  External labs:   None  Allergies   Allergies  Allergen Reactions   Amoxicillin Itching   Penicillins Hives   Potassium-Containing Compounds Itching    Medications Prior to Visit:   Outpatient Medications Prior to Visit  Medication Sig Dispense Refill   Acetaminophen (TYLENOL PO) Take by mouth.     Ascorbic Acid (VITAMIN C PO) Take by mouth.     calcium carbonate (TUMS - DOSED IN MG ELEMENTAL CALCIUM) 500 MG chewable tablet Chew 2 tablets by mouth daily.      cetirizine (ZYRTEC) 10 MG tablet Take 1 tablet by mouth daily.     Cholecalciferol (VITAMIN D3 PO) 1 cap PO QD     Garlic 123XX123 MG CAPS Take by mouth.     glucosamine-chondroitin 500-400 MG tablet Take 1 tablet by mouth 3 (three) times daily.     lisinopril (ZESTRIL) 5 MG tablet TAKE 1 TABLET(5 MG) BY MOUTH DAILY 90 tablet 1   Multiple Vitamin (MULTIVITAMIN) tablet Take 1 tablet by mouth daily.     Multiple Vitamins-Minerals (PRESERVISION AREDS PO) Take by mouth.     Omega-3 Fatty Acids (FISH OIL) 1000 MG CAPS Take 1 tablet by mouth daily.     pantoprazole (PROTONIX) 40 MG tablet TAKE 1 TABLET(40 MG) BY MOUTH DAILY 30 MINUTES BEFORE BREAKFAST 30  tablet 11   traMADol (ULTRAM) 50 MG tablet Take 1 tablet (50 mg total) by mouth every 6 (six) hours as needed. 30 tablet 0   No facility-administered medications  prior to visit.   Final Medications at End of Visit    Current Meds  Medication Sig   Acetaminophen (TYLENOL PO) Take by mouth.   Ascorbic Acid (VITAMIN C PO) Take by mouth.   calcium carbonate (TUMS - DOSED IN MG ELEMENTAL CALCIUM) 500 MG chewable tablet Chew 2 tablets by mouth daily.    cetirizine (ZYRTEC) 10 MG tablet Take 1 tablet by mouth daily.   Cholecalciferol (VITAMIN D3 PO) 1 cap PO QD   Garlic 123XX123 MG CAPS Take by mouth.   glucosamine-chondroitin 500-400 MG tablet Take 1 tablet by mouth 3 (three) times daily.   lisinopril (ZESTRIL) 5 MG tablet TAKE 1 TABLET(5 MG) BY MOUTH DAILY   Multiple Vitamin (MULTIVITAMIN) tablet Take 1 tablet by mouth daily.   Multiple Vitamins-Minerals (PRESERVISION AREDS PO) Take by mouth.   Omega-3 Fatty Acids (FISH OIL) 1000 MG CAPS Take 1 tablet by mouth daily.   pantoprazole (PROTONIX) 40 MG tablet TAKE 1 TABLET(40 MG) BY MOUTH DAILY 30 MINUTES BEFORE BREAKFAST   Radiology:   No results found.  Cardiac Studies:   2 week event monitor 09/11-09/24/20: Dominant rhythm showed normal sinus rhythm. Symptoms of flutter skipped beats correlated with NSR with occasional PVC's and occasional episodes of PVC's in trigeminal and quadgeminal pattern. No A fib, SVT, or high degree AV block.   Echocardiogram 09/06/2019:  Normal LV systolic function with EF 73%. Left ventricle cavity is normal in size. Mild concentric hypertrophy of the left ventricle. Normal global wall motion. Doppler evidence of grade I (impaired) diastolic dysfunction, normal LAP. Calculated EF 73%. Left atrial cavity is mildly dilated by volume. Trileaflet aortic valve. Trace aortic regurgitation. No significant change since 12/28/2012.  EKG:   11/19/2021: Sinus rhythm with 68 bpm.  Left atrial enlargement.  Left axis.  LVH  with secondary ST-T abnormalities, however cannot exclude ischemia.  Compared EKG 08/24/2019, no significant change.  08/24/2019: Normal sinus rhythm at 78 bpm, left axis deviation, left anterior fasicular block, LVH. No evidence of ischemia.   Assessment     ICD-10-CM   1. Preoperative cardiovascular examination  Z01.810 EKG 12-Lead    PCV ECHOCARDIOGRAM COMPLETE    PCV MYOCARDIAL PERFUSION WO LEXISCAN    2. Dyspnea on exertion  R06.09 PCV ECHOCARDIOGRAM COMPLETE    PCV MYOCARDIAL PERFUSION WO LEXISCAN    3. Precordial pain  R07.2 PCV ECHOCARDIOGRAM COMPLETE    PCV MYOCARDIAL PERFUSION WO LEXISCAN    4. Bilateral leg edema  R60.0 PCV ECHOCARDIOGRAM COMPLETE       Medications Discontinued During This Encounter  Medication Reason   traMADol (ULTRAM) 50 MG tablet     No orders of the defined types were placed in this encounter.   Recommendations:   Kayona Hyman is a 87 y.o. female  with hypertension, GERD, originally evaluated by [place] for palpitations.   Patient was last seen in our office 09/26/2019 for palpitations, at which time cardiac monitor revealed PVCs and symptoms significantly improved, she was therefore advised to follow-up as needed.  Patient is now referred back to our office for preoperative risk stratification as she is scheduled for upcoming EGD.  Given patient's cardiovascular risk factors including advanced age and hypertension in the setting of dyspnea on exertion and chest pain feel further cardiac evaluation is appropriate prior to patient's upcoming EGD.  Given chest pain and dyspnea, as well as bilateral leg edema on exam, will obtain nuclear stress test as well as echocardiogram.  If stress test and  echocardiogram without significant abnormalities patient may proceed with EGD at low risk from a cardiac perspective.  However further recommendations pending results of cardiac testing.   Alethia Berthold, PA-C 11/19/2021, 3:37 PM Office:  [phone]

## 2021-11-19 ENCOUNTER — Other Ambulatory Visit: Payer: Self-pay

## 2021-11-19 ENCOUNTER — Ambulatory Visit: Payer: Medicare Other | Admitting: Student

## 2021-11-19 ENCOUNTER — Encounter: Payer: Self-pay | Admitting: Student

## 2021-11-19 VITALS — BP 138/73 | HR 71 | Ht 60.0 in | Wt 188.4 lb

## 2021-11-19 DIAGNOSIS — R0609 Other forms of dyspnea: Secondary | ICD-10-CM

## 2021-11-19 DIAGNOSIS — R072 Precordial pain: Secondary | ICD-10-CM

## 2021-11-19 DIAGNOSIS — R600 Localized edema: Secondary | ICD-10-CM | POA: Diagnosis not present

## 2021-11-19 DIAGNOSIS — Z01810 Encounter for preprocedural cardiovascular examination: Secondary | ICD-10-CM | POA: Diagnosis not present

## 2021-11-25 ENCOUNTER — Ambulatory Visit: Payer: Medicare Other | Admitting: Surgery

## 2021-11-30 ENCOUNTER — Other Ambulatory Visit: Payer: Self-pay

## 2021-11-30 ENCOUNTER — Ambulatory Visit: Payer: Medicare Other

## 2021-11-30 DIAGNOSIS — Z01810 Encounter for preprocedural cardiovascular examination: Secondary | ICD-10-CM

## 2021-11-30 DIAGNOSIS — R600 Localized edema: Secondary | ICD-10-CM

## 2021-11-30 DIAGNOSIS — R072 Precordial pain: Secondary | ICD-10-CM

## 2021-11-30 DIAGNOSIS — R0609 Other forms of dyspnea: Secondary | ICD-10-CM | POA: Diagnosis not present

## 2021-12-09 ENCOUNTER — Encounter: Payer: Self-pay | Admitting: Internal Medicine

## 2021-12-10 ENCOUNTER — Encounter: Payer: Self-pay | Admitting: Student

## 2021-12-21 ENCOUNTER — Ambulatory Visit (INDEPENDENT_AMBULATORY_CARE_PROVIDER_SITE_OTHER): Payer: Medicare Other | Admitting: Internal Medicine

## 2021-12-21 ENCOUNTER — Other Ambulatory Visit: Payer: Self-pay

## 2021-12-21 ENCOUNTER — Encounter: Payer: Self-pay | Admitting: Internal Medicine

## 2021-12-21 VITALS — BP 124/76 | HR 64 | Temp 97.8°F | Ht 60.0 in | Wt 188.8 lb

## 2021-12-21 DIAGNOSIS — H8110 Benign paroxysmal vertigo, unspecified ear: Secondary | ICD-10-CM

## 2021-12-21 DIAGNOSIS — Z6836 Body mass index (BMI) 36.0-36.9, adult: Secondary | ICD-10-CM

## 2021-12-21 DIAGNOSIS — N182 Chronic kidney disease, stage 2 (mild): Secondary | ICD-10-CM | POA: Diagnosis not present

## 2021-12-21 DIAGNOSIS — Z23 Encounter for immunization: Secondary | ICD-10-CM | POA: Diagnosis not present

## 2021-12-21 DIAGNOSIS — I739 Peripheral vascular disease, unspecified: Secondary | ICD-10-CM | POA: Diagnosis not present

## 2021-12-21 DIAGNOSIS — I129 Hypertensive chronic kidney disease with stage 1 through stage 4 chronic kidney disease, or unspecified chronic kidney disease: Secondary | ICD-10-CM | POA: Diagnosis not present

## 2021-12-21 NOTE — Patient Instructions (Addendum)
Chronic Kidney Disease, Adult Chronic kidney disease is when lasting damage happens to the kidneys slowly over a long time. The kidneys help to: Make pee (urine). Make hormones. Keep the right amount of fluids and chemicals in the body. Most often, this disease does not go away. You must take steps to help keep the kidney damage from getting worse. If steps are not taken, the kidneys might stop working forever. What are the causes? Diabetes. High blood pressure. Diseases that affect the heart and blood vessels. Other kidney diseases. Diseases of the body's disease-fighting system. A problem with the flow of pee. Infections of the organs that make pee, store it, and take it out of the body. Swelling or irritation of your blood vessels. What increases the risk? Getting older. Having someone in your family who has kidney disease or kidney failure. Having a disease caused by genes. Taking medicines often that harm the kidneys. Being near or having contact with harmful substances. Being very overweight. Using tobacco now or in the past. What are the signs or symptoms? Feeling very tired. Having a swollen face, legs, ankles, or feet. Feeling like you may vomit or vomiting. Not feeling hungry. Being confused or not able to focus. Twitches and cramps in the leg muscles or other muscles. Dry, itchy skin. A taste of metal in your mouth. Making less pee, or making more pee. Shortness of breath. Trouble sleeping. You may also become anemic or get weak bones. Anemic means there is not enough red blood cells or hemoglobin in your blood. You may get symptoms slowly. You may not notice them until the kidney damage gets very bad. How is this treated? Often, there is no cure for this disease. Treatment can help with symptoms and help keep the disease from getting worse. You may need to: Avoid alcohol. Avoid foods that are high in salt, potassium, phosphorous, and protein. Take medicines for  symptoms and to help control other conditions. Have dialysis. This treatment gets harmful waste out of your body. Treat other problems that cause your kidney disease or make it worse. Follow these instructions at home: Medicines Take over-the-counter and prescription medicines only as told by your doctor. Do not take any new medicines, vitamins, or supplements unless your doctor says it is okay. Lifestyle  Do not smoke or use any products that contain nicotine or tobacco. If you need help quitting, ask your doctor. If you drink alcohol: Limit how much you use to: 0-1 drink a day for women who are not pregnant. 0-2 drinks a day for men. Know how much alcohol is in your drink. In the [place] one drink equals one 12 oz bottle of beer (355 mL), one 5 oz glass of wine (148 mL), or one 1 oz glass of hard liquor (44 mL). Stay at a healthy weight. If you need help losing weight, ask your doctor. General instructions  Follow instructions from your doctor about what you cannot eat or drink. Track your blood pressure at home. Tell your doctor about any changes. If you have diabetes, track your blood sugar. Exercise at least 30 minutes a day, 5 days a week. Keep your shots (vaccinations) up to date. Keep all follow-up visits. Where to find more information American Association of Kidney Patients: BombTimer.gl National Kidney Foundation: www.kidney.[place]: https://mathis.com/ Life Options: www.lifeoptions.org Kidney School: www.kidneyschool.org Contact a doctor if: Your symptoms get worse. You get new symptoms. Get help right away if: You get symptoms of end-stage kidney disease. These  include: Headaches. Losing feeling in your hands or feet. Easy bruising. Having hiccups often. Chest pain. Shortness of breath. Lack of menstrual periods, in women. You have a fever. You make less pee than normal. You have pain or you bleed when you pee or poop. These symptoms may be an  emergency. Get help right away. Call your local emergency services (911 in the [place] Do not wait to see if the symptoms will go away. Do not drive yourself to the hospital. Summary Chronic kidney disease is when lasting damage happens to the kidneys slowly over a long time. Causes of this disease include diabetes and high blood pressure. Often, there is no cure for this disease. Treatment can help symptoms and help keep the disease from getting worse. Treatment may involve lifestyle changes, medicines, and dialysis. This information is not intended to replace advice given to you by your health care provider. Make sure you discuss any questions you have with your health care provider. Document Revised: 03/05/2020 Document Reviewed: 03/05/2020 Elsevier Patient Education  2022 [place] With Less [employer] with less salt is one way to reduce the amount of sodium you get from food. Sodium is one of the elements that make up salt. It is found naturally in foods and is also added to certain foods. Depending on your condition and overall health, your health care provider or dietitian may recommend that you reduce your sodium intake. Most people should have less than 2,300 milligrams (mg) of sodium each day. If you have high blood pressure (hypertension), you may need to limit your sodium to 1,500 mg each day. Follow the tips below to help reduce your sodium intake. What are tips for eating less sodium? Reading food labels  Check the food label before buying or using packaged ingredients. Always check the label for the serving size and sodium content. Look for products with no more than 140 mg of sodium in one serving. Check the % Daily Value column to see what percent of the daily recommended amount of sodium is provided in one serving of the product. Foods with 5% or less in this column are considered low in sodium. Foods with 20% or higher are considered high in sodium. Do not  choose foods with salt as one of the first three ingredients on the ingredients list. If salt is one of the first three ingredients, it usually means the item is high in sodium. Shopping Buy sodium-free or low-sodium products. Look for the following words on food labels: Low-sodium. Sodium-free. Reduced-sodium. No salt added. Unsalted. Always check the sodium content even if foods are labeled as low-sodium or no salt added. Buy fresh foods. Cooking Use herbs, seasonings without salt, and spices as substitutes for salt. Use sodium-free baking soda when baking. Grill, braise, or roast foods to add flavor with less salt. Avoid adding salt to pasta, rice, or hot cereals. Drain and rinse canned vegetables, beans, and meat before use. Avoid adding salt when cooking sweets and desserts. Cook with low-sodium ingredients. What foods are high in sodium? Vegetables Regular canned vegetables (not low-sodium or reduced-sodium). Sauerkraut, pickled vegetables, and relishes. Olives. [place] fries. Onion rings. Regular canned tomato sauce and paste. Regular tomato and vegetable juice. Frozen vegetables in sauces. Grains Instant hot cereals. Bread stuffing, pancake, and biscuit mixes. Croutons. Seasoned rice or pasta mixes. Noodle soup cups. Boxed or frozen macaroni and cheese. Regular salted crackers. Self-rising flour. Rolls. Bagels. Flour tortillas and wraps. Meats and other proteins Meat  or fish that is salted, canned, smoked, cured, spiced, or pickled. This includes bacon, ham, sausages, hot dogs, corned beef, chipped beef, meat loaves, salt pork, jerky, pickled herring, anchovies, regular canned tuna, and sardines. Salted nuts. Dairy Processed cheese and cheese spreads. Cheese curds. Blue cheese. Feta cheese. String cheese. Regular cottage cheese. Buttermilk. Canned milk. The items listed above may not be a complete list of foods high in sodium. Actual amounts of sodium may be different depending on  processing. Contact a dietitian for more information. What foods are low in sodium? Fruits Fresh, frozen, or canned fruit with no sauce added. Fruit juice. Vegetables Fresh or frozen vegetables with no sauce added. "No salt added" canned vegetables. "No salt added" tomato sauce and paste. Low-sodium or reduced-sodium tomato and vegetable juice. Grains Noodles, pasta, quinoa, rice. Shredded or puffed wheat or puffed rice. Regular or quick oats (not instant). Low-sodium crackers. Low-sodium bread. Whole-grain bread and whole-grain pasta. Unsalted popcorn. Meats and other proteins Fresh or frozen whole meats, poultry (not injected with sodium), and fish with no sauce added. Unsalted nuts. Dried peas, beans, and lentils without added salt. Unsalted canned beans. Eggs. Unsalted nut butters. Low-sodium canned tuna or chicken. Dairy Milk. Soy milk. Yogurt. Low-sodium cheeses, such as Swiss, Monterey Jack, [place], and [employer]. Sherbet or ice cream (keep to  cup per serving). Cream cheese. Fats and oils Unsalted butter or margarine. Other foods Homemade pudding. Sodium-free baking soda and baking powder. Herbs and spices. Low-sodium seasoning mixes. Beverages Coffee and tea. Carbonated beverages. The items listed above may not be a complete list of foods low in sodium. Actual amounts of sodium may be different depending on processing. Contact a dietitian for more information. What are some salt alternatives when cooking? The following are herbs, seasonings, and spices that can be used instead of salt to flavor your food. Herbs should be fresh or dried. Do not choose packaged mixes. Next to the name of the herb, spice, or seasoning are some examples of foods you can pair it with. Herbs [place] leaves - Soups, meat and vegetable dishes, and spaghetti sauce. Basil - [employer], soups, pasta, and fish dishes. Cilantro - Meat, poultry, and vegetable dishes. Chili powder - Marinades and Mexican  dishes. Chives - Salad dressings and potato dishes. Cumin - Mexican dishes, couscous, and meat dishes. Dill - Fish dishes, sauces, and salads. Fennel - Meat and vegetable dishes, breads, and cookies. Garlic (do not use garlic salt) - [place] dishes, meat dishes, salad dressings, and sauces. Marjoram - Soups, potato dishes, and meat dishes. Oregano - Pizza and spaghetti sauce. Parsley - Salads, soups, pasta, and meat dishes. Rosemary - [place] dishes, salad dressings, soups, and red meats. Saffron - Fish dishes, pasta, and some poultry dishes. Sage - Stuffings and sauces. Tarragon - Fish and [employer]. Thyme - Stuffing, meat, and fish dishes. Seasonings Lemon juice - Fish dishes, poultry dishes, vegetables, and salads. Vinegar - Salad dressings, vegetables, and fish dishes. Spices Cinnamon - Sweet dishes, such as cakes, cookies, and puddings. Cloves - Gingerbread, puddings, and marinades for meats. Curry - Vegetable dishes, fish and poultry dishes, and stir-fry dishes. Ginger - Vegetable dishes, fish dishes, and stir-fry dishes. Nutmeg - Pasta, vegetables, poultry, fish dishes, and custard. Summary Cooking with less salt is one way to reduce the amount of sodium that you get from food. Buy sodium-free or low-sodium products. Check the food label before using or buying packaged ingredients. Use herbs, seasonings without salt, and spices as substitutes  for salt in foods. This information is not intended to replace advice given to you by your health care provider. Make sure you discuss any questions you have with your health care provider. Document Revised: 11/21/2019 Document Reviewed: 11/21/2019 Elsevier Patient Education  [place].

## 2021-12-21 NOTE — Progress Notes (Signed)
I,Katawbba Wiggins,acting as a Education administrator for [name], MD.,have documented all relevant documentation on the behalf of [name], MD,as directed by  [name], MD while in the presence of [name], MD.  This visit occurred during the SARS-CoV-2 public health emergency.  Safety protocols were in place, including screening questions prior to the visit, additional usage of staff PPE, and extensive cleaning of exam room while observing appropriate contact time as indicated for disinfecting solutions.  Subjective:     Patient ID: Tricia Ferguson , female    DOB: 07-02-1934 , 88 y.o.   MRN: [id number]   Chief Complaint  Patient presents with   Hypertension    HPI  She is here today for BP check.  She reports compliance with meds. She denies headaches, chest pain and shortness of breath.   Hypertension This is a chronic problem. The current episode started more than 1 year ago. The problem has been gradually improving since onset. The problem is controlled. Pertinent negatives include no blurred vision, chest pain, palpitations or shortness of breath. Risk factors for coronary artery disease include obesity and post-menopausal state. The current treatment provides moderate improvement.    Past Medical History:  Diagnosis Date   Chronic kidney disease    Hypertension    Peripheral vascular disease ([place])    Vitamin D deficiency disease      Family History  Problem Relation Age of Onset   Hypertension Mother    Throat cancer Mother    Prostate cancer Father    Tremor Father    Hypertension Sister    Diabetes Sister    Hypertension Brother    Hypertension Sister    Diabetes Sister    Hypertension Sister      Current Outpatient Medications:    Acetaminophen (TYLENOL PO), Take by mouth., Disp: , Rfl:    Ascorbic Acid (VITAMIN C PO), Take by mouth., Disp: , Rfl:    calcium carbonate (TUMS - DOSED IN MG ELEMENTAL CALCIUM) 500 MG chewable tablet, Chew 2  tablets by mouth daily. , Disp: , Rfl:    cetirizine (ZYRTEC) 10 MG tablet, Take 1 tablet by mouth daily., Disp: , Rfl:    Cholecalciferol (VITAMIN D3 PO), 1 cap PO QD, Disp: , Rfl:    Garlic 7829 MG CAPS, Take by mouth., Disp: , Rfl:    glucosamine-chondroitin 500-400 MG tablet, Take 1 tablet by mouth 3 (three) times daily., Disp: , Rfl:    lisinopril (ZESTRIL) 5 MG tablet, TAKE 1 TABLET(5 MG) BY MOUTH DAILY, Disp: 90 tablet, Rfl: 1   Multiple Vitamin (MULTIVITAMIN) tablet, Take 1 tablet by mouth daily., Disp: , Rfl:    Multiple Vitamins-Minerals (PRESERVISION AREDS PO), Take by mouth., Disp: , Rfl:    Omega-3 Fatty Acids (FISH OIL) 1000 MG CAPS, Take 1 tablet by mouth daily., Disp: , Rfl:    pantoprazole (PROTONIX) 40 MG tablet, TAKE 1 TABLET(40 MG) BY MOUTH DAILY 30 MINUTES BEFORE BREAKFAST, Disp: 30 tablet, Rfl: 11   Allergies  Allergen Reactions   Amoxicillin Itching   Penicillins Hives   Potassium-Containing Compounds Itching     Review of Systems  Constitutional: Negative.   Eyes:  Negative for blurred vision.  Respiratory: Negative.  Negative for shortness of breath.   Cardiovascular: Negative.  Negative for chest pain and palpitations.  Gastrointestinal: Negative.   Psychiatric/Behavioral: Negative.    All other systems reviewed and are negative.   Today's Vitals   12/21/21 1546  BP: 124/76  Pulse: 64  Temp: 97.8 F (36.6 C)  Weight: 188 lb 12.8 oz (85.6 kg)  Height: 5' (1.524 m)  PainSc: 10-Worst pain ever  PainLoc: Leg   Body mass index is 36.87 kg/m.  Wt Readings from Last 3 Encounters:  12/21/21 188 lb 12.8 oz (85.6 kg)  11/19/21 188 lb 6.4 oz (85.5 kg)  11/17/21 187 lb (84.8 kg)    BP Readings from Last 3 Encounters:  12/21/21 124/76  11/19/21 138/73  11/17/21 (!) 147/72    Objective:  Physical Exam Vitals and nursing note reviewed.  Constitutional:      Appearance: Normal appearance. She is obese.  HENT:     Head: Normocephalic and atraumatic.      Nose:     Comments: Masked     Mouth/Throat:     Comments: Masked  Eyes:     Extraocular Movements: Extraocular movements intact.  Cardiovascular:     Rate and Rhythm: Normal rate and regular rhythm.     Heart sounds: Normal heart sounds.  Pulmonary:     Effort: Pulmonary effort is normal.     Breath sounds: Normal breath sounds.  Musculoskeletal:     Cervical back: Normal range of motion.  Skin:    General: Skin is warm.  Neurological:     General: No focal deficit present.     Mental Status: She is alert.  Psychiatric:        Mood and Affect: Mood normal.        Behavior: Behavior normal.        Assessment And Plan:     1. Parenchymal renal hypertension, stage 1 through stage 4 or unspecified chronic kidney disease Comments: Chronic, well controlled. No med changes. She is encouraged to follow low sodium diet. She will f/u in six months for re-evaluation.  - CMP14+EGFR  2. Chronic renal disease, stage II Comments: Chronic, I will check GFR, CR today. Advised to stay well hydrated, keep BP controlled and avoid NSAIDs to delay progression of CKD.   3. Peripheral arterial disease ([place]) Comments: Chronic, importance of regular exercise was discussed with the patient.  She should consider statin therapy.   4. Class 2 severe obesity due to excess calories with serious comorbidity and body mass index (BMI) of 36.0 to 36.9 in adult [facility]) Comments:  She is encouraged to strive for BMI less than 30 to decrease cardiac risk. Advised to aim for at least 150 minutes of exercise per week.    5. Need for vaccination Comments: She was given Pneumovax-23 IM x 1.   Patient was given opportunity to ask questions. Patient verbalized understanding of the plan and was able to repeat key elements of the plan. All questions were answered to their satisfaction.   I, [name], MD, have reviewed all documentation for this visit. The documentation on 12/23/21 for the exam, diagnosis,  procedures, and orders are all accurate and complete.   IF YOU HAVE BEEN REFERRED TO A SPECIALIST, IT MAY TAKE 1-2 WEEKS TO SCHEDULE/PROCESS THE REFERRAL. IF YOU HAVE NOT HEARD FROM US/SPECIALIST IN TWO WEEKS, PLEASE GIVE [place] A CALL AT [phone].   THE PATIENT IS ENCOURAGED TO PRACTICE SOCIAL DISTANCING DUE TO THE COVID-19 PANDEMIC.

## 2021-12-28 ENCOUNTER — Other Ambulatory Visit: Payer: Self-pay

## 2021-12-28 ENCOUNTER — Encounter: Payer: Self-pay | Admitting: Student

## 2021-12-28 DIAGNOSIS — R1310 Dysphagia, unspecified: Secondary | ICD-10-CM

## 2021-12-28 DIAGNOSIS — K219 Gastro-esophageal reflux disease without esophagitis: Secondary | ICD-10-CM

## 2021-12-28 NOTE — Telephone Encounter (Signed)
Called patient back to let her know that her cardiologist gave her the okay to proceed with her EGD. Therefore, she agreed and she wants to have it done on 01/11/2022 at [facility]. I told her that I would schedule it and that I would send her the instructions by mail and she agreed and had no further questions.

## 2022-01-05 DIAGNOSIS — H35453 Secondary pigmentary degeneration, bilateral: Secondary | ICD-10-CM | POA: Diagnosis not present

## 2022-01-05 DIAGNOSIS — H353111 Nonexudative age-related macular degeneration, right eye, early dry stage: Secondary | ICD-10-CM | POA: Diagnosis not present

## 2022-01-05 DIAGNOSIS — H35363 Drusen (degenerative) of macula, bilateral: Secondary | ICD-10-CM | POA: Diagnosis not present

## 2022-01-05 DIAGNOSIS — H353221 Exudative age-related macular degeneration, left eye, with active choroidal neovascularization: Secondary | ICD-10-CM | POA: Diagnosis not present

## 2022-01-09 ENCOUNTER — Other Ambulatory Visit: Payer: Self-pay | Admitting: Internal Medicine

## 2022-01-11 ENCOUNTER — Ambulatory Visit
Admission: RE | Admit: 2022-01-11 | Discharge: 2022-01-11 | Disposition: A | Discharge status: Home / Self Care | Payer: Medicare Other | Attending: Gastroenterology | Admitting: Gastroenterology

## 2022-01-11 ENCOUNTER — Ambulatory Visit: Payer: Medicare Other | Admitting: Registered Nurse

## 2022-01-11 ENCOUNTER — Encounter
Admission: RE | Disposition: A | Discharge status: Home / Self Care | Payer: Self-pay | Source: Home / Self Care | Attending: Gastroenterology

## 2022-01-11 ENCOUNTER — Encounter: Payer: Self-pay | Admitting: Gastroenterology

## 2022-01-11 DIAGNOSIS — K219 Gastro-esophageal reflux disease without esophagitis: Secondary | ICD-10-CM | POA: Diagnosis not present

## 2022-01-11 DIAGNOSIS — Z87891 Personal history of nicotine dependence: Secondary | ICD-10-CM | POA: Diagnosis not present

## 2022-01-11 DIAGNOSIS — E559 Vitamin D deficiency, unspecified: Secondary | ICD-10-CM | POA: Diagnosis not present

## 2022-01-11 DIAGNOSIS — R1310 Dysphagia, unspecified: Secondary | ICD-10-CM | POA: Insufficient documentation

## 2022-01-11 HISTORY — PX: ESOPHAGOGASTRODUODENOSCOPY (EGD) WITH PROPOFOL: SHX5813

## 2022-01-11 SURGERY — ESOPHAGOGASTRODUODENOSCOPY (EGD) WITH PROPOFOL
Anesthesia: General

## 2022-01-11 MED ORDER — SODIUM CHLORIDE 0.9 % IV SOLN: INTRAVENOUS | Status: DC

## 2022-01-11 MED ORDER — METOPROLOL TARTRATE 5 MG/5ML IV SOLN
INTRAVENOUS | Status: AC
  Filled 2022-01-11: qty 5

## 2022-01-11 MED ORDER — PROPOFOL 10 MG/ML IV BOLUS
INTRAVENOUS | Status: DC | PRN
Start: 2022-01-11 — End: 2022-01-11
  Administered 2022-01-11: 70 mg via INTRAVENOUS

## 2022-01-11 MED ORDER — LIDOCAINE HCL (CARDIAC) PF 100 MG/5ML IV SOSY
PREFILLED_SYRINGE | INTRAVENOUS | Status: DC | PRN
  Administered 2022-01-11: 100 mg via INTRAVENOUS

## 2022-01-11 MED ORDER — PROPOFOL 500 MG/50ML IV EMUL
INTRAVENOUS | Status: DC | PRN
  Administered 2022-01-11: 150 ug/kg/min via INTRAVENOUS

## 2022-01-11 NOTE — Op Note (Signed)
[facility] Patient Name: Tricia Ferguson Procedure Date: 01/11/2022 9:27 AM MRN: [id number] Account #: [id number] Date of Birth: [birth year] Admit Type: Outpatient Age: 88 Room: [facility] ENDO ROOM 4 Gender: Female Note Status: Finalized Instrument Name: Michaelle Birks [id number] Procedure:             Upper GI endoscopy Indications:           Dysphagia Providers:             Jonathon Bellows MD, MD Referring MD:          Theda Belfast. Baird Cancer, MD (Referring MD) Medicines:             Monitored Anesthesia Care Complications:         No immediate complications. Procedure:             Pre-Anesthesia Assessment:                        - Prior to the procedure, a History and Physical was                         performed, and patient medications, allergies and                         sensitivities were reviewed. The patient's tolerance                         of previous anesthesia was reviewed.                        - The risks and benefits of the procedure and the                         sedation options and risks were discussed with the                         patient. All questions were answered and informed                         consent was obtained.                        - ASA Grade Assessment: III - A patient with severe                         systemic disease.                        After obtaining informed consent, the endoscope was                         passed under direct vision. Throughout the procedure,                         the patient's blood pressure, pulse, and oxygen                         saturations were monitored continuously. The Endoscope                         was introduced through  the mouth, and advanced to the                         third part of duodenum. The upper GI endoscopy was                         accomplished with ease. The patient tolerated the                         procedure well. Findings:      The examined  esophagus was normal. Biopsies were taken with a cold       forceps for histology.      The examined duodenum was normal.      The stomach was normal.      The cardia and gastric fundus were normal on retroflexion. Impression:            - Normal esophagus. Biopsied.                        - Normal examined duodenum.                        - Normal stomach. Recommendation:        - Await pathology results.                        - Discharge patient to home (with escort).                        - Resume previous diet.                        - Continue present medications.                        - Await pathology results.                        - Return to my office as previously scheduled. Procedure Code(s):     --- Professional ---                        [phone], Esophagogastroduodenoscopy, flexible,                         transoral; with biopsy, single or multiple Diagnosis Code(s):     --- Professional ---                        R13.10, Dysphagia, unspecified CPT copyright 2019 American Medical Association. All rights reserved. The codes documented in this report are preliminary and upon coder review may  be revised to meet current compliance requirements. Jonathon Bellows, MD Jonathon Bellows MD, MD 01/11/2022 9:49:13 AM This report has been signed electronically. Number of Addenda: 0 Note Initiated On: 01/11/2022 9:27 AM Estimated Blood Loss:  Estimated blood loss: none.      [facility]

## 2022-01-11 NOTE — Anesthesia Preprocedure Evaluation (Signed)
Anesthesia Evaluation  Patient identified by MRN, date of birth, ID band Patient awake    Reviewed: Allergy & Precautions, NPO status , Patient's Chart, lab work & pertinent test results  History of Anesthesia Complications Negative for: history of anesthetic complications  Airway Mallampati: II  TM Distance: >3 FB Neck ROM: Full    Dental  (+) Poor Dentition, Chipped   Pulmonary neg pulmonary ROS, neg sleep apnea, neg COPD, Patient abstained from smoking.Not current smoker, former smoker,    Pulmonary exam normal breath sounds clear to auscultation       Cardiovascular Exercise Tolerance: Good METShypertension, + Peripheral Vascular Disease  (-) CAD and (-) Past MI (-) dysrhythmias  Rhythm:Regular Rate:Normal - Systolic murmurs    Neuro/Psych negative neurological ROS  negative psych ROS   GI/Hepatic neg GERD  ,(+)     (-) substance abuse  ,   Endo/Other  neg diabetes  Renal/GU CRFRenal disease     Musculoskeletal   Abdominal   Peds  Hematology   Anesthesia Other Findings Past Medical History: No date: Chronic kidney disease No date: Hypertension No date: Peripheral vascular disease ([facility]) No date: Vitamin D deficiency disease  Reproductive/Obstetrics                             Anesthesia Physical Anesthesia Plan  ASA: 3  Anesthesia Plan: General   Post-op Pain Management: Minimal or no pain anticipated   Induction: Intravenous  PONV Risk Score and Plan: 3 and Propofol infusion, TIVA and Ondansetron  Airway Management Planned: Nasal Cannula  Additional Equipment: None  Intra-op Plan:   Post-operative Plan:   Informed Consent: I have reviewed the patients History and Physical, chart, labs and discussed the procedure including the risks, benefits and alternatives for the proposed anesthesia with the patient or authorized representative who has indicated his/her  understanding and acceptance.     Dental advisory given  Plan Discussed with: CRNA and Surgeon  Anesthesia Plan Comments: (Discussed risks of anesthesia with patient, including possibility of difficulty with spontaneous ventilation under anesthesia necessitating airway intervention, PONV, post operative cognitive dysfunction, and rare risks such as cardiac or respiratory or neurological events, and allergic reactions. Discussed the role of CRNA in patient's perioperative care. Patient understands.)        Anesthesia Quick Evaluation

## 2022-01-11 NOTE — Anesthesia Postprocedure Evaluation (Signed)
Anesthesia Post Note  Patient: Tricia Ferguson  Procedure(s) Performed: ESOPHAGOGASTRODUODENOSCOPY (EGD) WITH PROPOFOL  Patient location during evaluation: Endoscopy Anesthesia Type: General Level of consciousness: awake and alert Pain management: pain level controlled Vital Signs Assessment: post-procedure vital signs reviewed and stable Respiratory status: spontaneous breathing, nonlabored ventilation, respiratory function stable and patient connected to nasal cannula oxygen Cardiovascular status: blood pressure returned to baseline and stable Postop Assessment: no apparent nausea or vomiting Anesthetic complications: no   No notable events documented.   Last Vitals:  Vitals:   01/11/22 0914 01/11/22 0952  BP: 138/63 115/60  Pulse: 70 69  Resp: 18 19  Temp: (!) 36.1 C 36.4 C  SpO2: 100% 96%    Last Pain:  Vitals:   01/11/22 0952  TempSrc: Temporal  PainSc: Asleep                 Arita Miss

## 2022-01-11 NOTE — Transfer of Care (Signed)
Immediate Anesthesia Transfer of Care Note  Patient: Tricia Ferguson  Procedure(s) Performed: ESOPHAGOGASTRODUODENOSCOPY (EGD) WITH PROPOFOL  Patient Location: Endoscopy Unit  Anesthesia Type:General  Level of Consciousness: drowsy  Airway & Oxygen Therapy: Patient Spontanous Breathing  Post-op Assessment: Report given to RN and Post -op Vital signs reviewed and stable  Post vital signs: Reviewed and stable  Last Vitals:  Vitals Value Taken Time  BP 115/60 01/11/22 0952  Temp 36.4 C 01/11/22 0952  Pulse 69 01/11/22 0953  Resp 19 01/11/22 0953  SpO2 96 % 01/11/22 0953  Vitals shown include unvalidated device data.  Last Pain:  Vitals:   01/11/22 0952  TempSrc: Temporal  PainSc: Asleep         Complications: No notable events documented.

## 2022-01-11 NOTE — H&P (Signed)
Jonathon Bellows, MD [address] [place], [place], [place], [address] [address], [place], [place], [place], [address] Phone: [phone]  Fax: [phone]  Primary Care Physician:  Glendale Chard, MD   Pre-Procedure History & Physical: HPI:  Tricia Ferguson is a 88 y.o. female is here for an endoscopy    Past Medical History:  Diagnosis Date   Chronic kidney disease    Hypertension    Peripheral vascular disease ([place])    Vitamin D deficiency disease     Past Surgical History:  Procedure Laterality Date   ABDOMINAL HYSTERECTOMY     HEMORRHOID SURGERY      Prior to Admission medications   Medication Sig Start Date End Date Taking? Authorizing Provider  lisinopril (ZESTRIL) 5 MG tablet TAKE 1 TABLET(5 MG) BY MOUTH DAILY 01/09/22  Yes Glendale Chard, MD  Acetaminophen (TYLENOL PO) Take by mouth.    [provider]  Ascorbic Acid (VITAMIN C PO) Take by mouth.    [provider]  calcium carbonate (TUMS - DOSED IN MG ELEMENTAL CALCIUM) 500 MG chewable tablet Chew 2 tablets by mouth daily.     [provider]  cetirizine (ZYRTEC) 10 MG tablet Take 1 tablet by mouth daily.    [provider]  Cholecalciferol (VITAMIN D3 PO) 1 cap PO QD    [provider]  Garlic 123XX123 MG CAPS Take by mouth.    [provider]  glucosamine-chondroitin 500-400 MG tablet Take 1 tablet by mouth 3 (three) times daily.    [provider]  Multiple Vitamin (MULTIVITAMIN) tablet Take 1 tablet by mouth daily.    [provider]  Multiple Vitamins-Minerals (PRESERVISION AREDS PO) Take by mouth.    [provider]  Omega-3 Fatty Acids (FISH OIL) 1000 MG CAPS Take 1 tablet by mouth daily.    [provider]  pantoprazole (PROTONIX) 40 MG tablet TAKE 1 TABLET(40 MG) BY MOUTH DAILY 30 MINUTES BEFORE BREAKFAST 05/25/21   Glendale Chard, MD    Allergies as of 12/29/2021 - Review Complete 12/21/2021   Allergen Reaction Noted   Amoxicillin Itching 02/20/2016   Penicillins Hives 02/20/2016   Potassium-containing compounds Itching 02/20/2016    Family History  Problem Relation Age of Onset   Hypertension Mother    Throat cancer Mother    Prostate cancer Father    Tremor Father    Hypertension Sister    Diabetes Sister    Hypertension Brother    Hypertension Sister    Diabetes Sister    Hypertension Sister     Social History   Socioeconomic History   Marital status: Widowed    Spouse name: Not on file   Number of children: 3   Years of education: Not on file   Highest education level: Not on file  Occupational History   Occupation: retired  Tobacco Use   Smoking status: Former    Packs/day: 1.00    Years: 20.00    Pack years: 20.00    Types: Cigarettes    Quit date: 2004    Years since quitting: 19.0   Smokeless tobacco: Never   Tobacco comments:    n/a  Vaping Use   Vaping Use: Never used  Substance and Sexual Activity   Alcohol use: No   Drug use: No   Sexual activity: Not Currently  Other Topics Concern   Not on file  Social History Narrative   Not on file   Social Determinants  of Health   Financial Resource Strain: Low Risk    Difficulty of Paying Living Expenses: Not hard at all  Food Insecurity: No Food Insecurity   Worried About [place] in the Last Year: Never true   [place] in the Last Year: Never true  Transportation Needs: No Transportation Needs   Lack of Transportation (Medical): No   Lack of Transportation (Non-Medical): No  Physical Activity: Inactive   Days of Exercise per Week: 0 days   Minutes of Exercise per Session: 0 min  Stress: No Stress Concern Present   Feeling of Stress : Not at all  Social Connections: Not on file  Intimate Partner Violence: Not on file    Review of Systems: See HPI, otherwise negative ROS  Physical Exam: BP 138/63    Pulse 70    Temp (!) 96.9 F (36.1 C) (Temporal)    Resp 18     Ht 5' (1.524 m)    Wt 83 kg    SpO2 100%    BMI 35.74 kg/m  General:   Alert,  pleasant and cooperative in NAD Head:  Normocephalic and atraumatic. Neck:  Supple; no masses or thyromegaly. Lungs:  Clear throughout to auscultation, normal respiratory effort.    Heart:  +S1, +S2, Regular rate and rhythm, No edema. Abdomen:  Soft, nontender and nondistended. Normal bowel sounds, without guarding, and without rebound.   Neurologic:  Alert and  oriented x4;  grossly normal neurologically.  Impression/Plan: Tricia Ferguson is here for an endoscopy  to be performed for  evaluation of dysphagia    Risks, benefits, limitations, and alternatives regarding endoscopy have been reviewed with the patient.  Questions have been answered.  All parties agreeable.   Jonathon Bellows, MD  01/11/2022, 9:23 AM

## 2022-01-12 LAB — SURGICAL PATHOLOGY

## 2022-01-13 ENCOUNTER — Encounter: Payer: Self-pay | Admitting: Gastroenterology

## 2022-01-18 ENCOUNTER — Other Ambulatory Visit: Payer: Self-pay

## 2022-01-18 ENCOUNTER — Encounter: Payer: Self-pay | Admitting: Gastroenterology

## 2022-01-18 ENCOUNTER — Ambulatory Visit: Payer: Medicare Other | Admitting: Gastroenterology

## 2022-01-18 VITALS — BP 135/74 | HR 83 | Temp 97.3°F | Wt 186.0 lb

## 2022-01-18 DIAGNOSIS — K219 Gastro-esophageal reflux disease without esophagitis: Secondary | ICD-10-CM

## 2022-01-18 DIAGNOSIS — R1310 Dysphagia, unspecified: Secondary | ICD-10-CM

## 2022-01-18 NOTE — Patient Instructions (Signed)

## 2022-01-18 NOTE — Progress Notes (Signed)
Wyline Mood MD, MRCP(U.K) [address]  [address]  [place], [place] [address]  Main: [phone]  Fax: [phone]   Primary Care Physician: Dorothyann Peng, MD  Primary Gastroenterologist:  Dr. Wyline Mood   Chief Complaint  Patient presents with   Gastroesophageal Reflux    HPI: Tricia Ferguson is a 88 y.o. female  Summary of history :  Referred  and seen initially on 11/17/2021 for a hiatal hernia.  Seen by Dr Everlene Farrier in the clinic , CT abdomen and pelvis showed no significant hernia on 09/07/2021 and barium swallow showed a small hiatal hernia and mild reflux.  She states that she was diagnosed with a hiatal hernia when she was in her 40s. Takes Protonix 20 mg once a day first thing in the morning on empty stomach.  H/o dysphagia for solids for 3 months.   Also having issues with palpitations but denies any chest pain.  Has not seen a cardiologist.  Complains of heartburn despite taking the medications.    Interval history  11/17/2021-01/18/2022  01/11/2022: EGD: Normal : Biopsies of the esophagus were normal.  Since the last visit she is doing well.  Denies any difficulty with swallowing.  She is yet to get COVID to be low.  Is taking her Protonix 40 mg first in the morning on an empty stomach.  Does not seem to have any symptoms of reflux but she feels like she has palpitations which she mentioned previously.   Current Outpatient Medications  Medication Sig Dispense Refill   Acetaminophen (TYLENOL PO) Take by mouth.     Ascorbic Acid (VITAMIN C PO) Take by mouth.     calcium carbonate (TUMS - DOSED IN MG ELEMENTAL CALCIUM) 500 MG chewable tablet Chew 2 tablets by mouth daily.      cetirizine (ZYRTEC) 10 MG tablet Take 1 tablet by mouth daily.     Cholecalciferol (VITAMIN D3 PO) 1 cap PO QD     Garlic 1000 MG CAPS Take by mouth.     glucosamine-chondroitin 500-400 MG tablet Take 1 tablet by mouth 3 (three) times daily.     levocetirizine (XYZAL) 5 MG tablet  Take 5 mg by mouth daily.     lisinopril (ZESTRIL) 5 MG tablet TAKE 1 TABLET(5 MG) BY MOUTH DAILY 90 tablet 1   Multiple Vitamin (MULTIVITAMIN) tablet Take 1 tablet by mouth daily.     Multiple Vitamins-Minerals (PRESERVISION AREDS PO) Take by mouth.     Omega-3 Fatty Acids (FISH OIL) 1000 MG CAPS Take 1 tablet by mouth daily.     pantoprazole (PROTONIX) 40 MG tablet TAKE 1 TABLET(40 MG) BY MOUTH DAILY 30 MINUTES BEFORE BREAKFAST 30 tablet 11   No current facility-administered medications for this visit.    Allergies as of 01/18/2022 - Review Complete 01/18/2022  Allergen Reaction Noted   Amoxicillin Itching 02/20/2016   Penicillins Hives 02/20/2016   Potassium-containing compounds Itching 02/20/2016    ROS:  General: Negative for anorexia, weight loss, fever, chills, fatigue, weakness. ENT: Negative for hoarseness, difficulty swallowing , nasal congestion. CV: Negative for chest pain, angina, palpitations, dyspnea on exertion, peripheral edema.  Respiratory: Negative for dyspnea at rest, dyspnea on exertion, cough, sputum, wheezing.  GI: See history of present illness. GU:  Negative for dysuria, hematuria, urinary incontinence, urinary frequency, nocturnal urination.  Endo: Negative for unusual weight change.    Physical Examination:   BP 135/74    Pulse 83    Temp (!) 97.3 F (36.3  C) (Oral)    Wt 186 lb (84.4 kg)    BMI 36.33 kg/m   General: Well-nourished, well-developed in no acute distress.  Eyes: No icterus. Conjunctivae pink. Mouth: Oropharyngeal mucosa moist and pink , no lesions erythema or exudate. Neuro: Alert and oriented x 3.  Grossly intact. Skin: Warm and dry, no jaundice.   Psych: Alert and cooperative, normal mood and affect.   Imaging Studies: No results found.  Assessment and Plan:   Tricia Ferguson is a 88 y.o. y/o female here to follow up for GERD, dysphagia , hiatal hernia.  Her symptoms have resolved since commencing on Protonix.  EGD  showed no gross abnormalities.  No stricture was noted.     Plan  Advised to continue Protonix 40 mg once a day, can potentially try to reduce the dose to 20 mg once a day in a few weeks time. Continue lifestyle changes for acid reflux discussed  again and provided patient information strongly advised to use a wedge pillow. She does complain about palpitations and I advised her to discuss it with her primary care physician   Dr Jonathon Bellows  MD,MRCP [facility]) Follow up in as needed

## 2022-01-19 NOTE — Progress Notes (Signed)
Primary Physician/Referring:  Glendale Chard, MD  Patient ID: Tricia Ferguson, female    DOB: January 28, 1934, 88 y.o.   MRN: [id number]  Chief Complaint  Patient presents with   Results    Echocardiogram and stress tests   Follow-up    8 weeks   HPI:    Tricia Ferguson  is a 88 y.o. female  with hypertension, GERD, originally evaluated by [place] for palpitations.  Patient denies history of diabetes, hyperlipidemia, tobacco use.  Patient was referred back to our office 11/19/2021 for preoperative risk stratification due to upcoming EGD.  Patient underwent EGD 01/11/2022 without issue.  Patient now presents for follow-up of cardiac testing.  Echocardiogram revealed preserved LVEF with mildly dilated LA and mild atherosclerosis of the aorta, otherwise no significant abnormality.  Stress test was overall low risk. Patient's only concern is that she has had occasional episodes of palpations similar to those previously reported. Denies associated symptoms.   Past Medical History:  Diagnosis Date   Chronic kidney disease    Hypertension    Peripheral vascular disease ([place])    Vitamin D deficiency disease    Past Surgical History:  Procedure Laterality Date   ABDOMINAL HYSTERECTOMY     ESOPHAGOGASTRODUODENOSCOPY (EGD) WITH PROPOFOL N/A 01/11/2022   Procedure: ESOPHAGOGASTRODUODENOSCOPY (EGD) WITH PROPOFOL;  Surgeon: Jonathon Bellows, MD;  Location: [facility] ENDOSCOPY;  Service: Gastroenterology;  Laterality: N/A;   HEMORRHOID SURGERY     Family History  Problem Relation Age of Onset   Hypertension Mother    Throat cancer Mother    Prostate cancer Father    Tremor Father    Hypertension Sister    Diabetes Sister    Hypertension Brother    Hypertension Sister    Diabetes Sister    Hypertension Sister     Social History   Tobacco Use   Smoking status: Former    Packs/day: 1.00    Years: 20.00    Pack years: 20.00    Types: Cigarettes    Quit date: 2004    Years since  quitting: 19.1   Smokeless tobacco: Never   Tobacco comments:    n/a  Substance Use Topics   Alcohol use: No   Marital Status: Widowed   ROS  Review of Systems  Constitutional: Negative for malaise/fatigue and weight gain.  Cardiovascular:  Positive for dyspnea on exertion (stable) and palpitations (stable). Negative for chest pain, claudication, leg swelling, near-syncope, orthopnea, paroxysmal nocturnal dyspnea and syncope.  Neurological:  Negative for dizziness.   Objective  Blood pressure 121/69, pulse 70, temperature (!) 97.2 F (36.2 C), temperature source Temporal, resp. rate 16, height 5' (1.524 m), weight 188 lb 3.2 oz (85.4 kg), SpO2 96 %.  Vitals with BMI 01/21/2022 01/21/2022 01/18/2022  Height - 5\' 0"  -  Weight - 188 lbs 3 oz 186 lbs  BMI - [id number] [id number]  Systolic 123XX123 [id number]  Diastolic 69 67 74  Pulse 70 76 83      Physical Exam Vitals reviewed.  Constitutional:      Appearance: She is obese.  Cardiovascular:     Rate and Rhythm: Normal rate and regular rhythm.     Pulses: Intact distal pulses.     Heart sounds: S1 normal and S2 normal. No murmur heard.   No gallop.  Pulmonary:     Effort: Pulmonary effort is normal. No respiratory distress.     Breath sounds: No wheezing, rhonchi or rales.  Musculoskeletal:     Right  lower leg: Edema (minimal) present.     Left lower leg: Edema (minimal) present.  Neurological:     Mental Status: She is alert.    Laboratory examination:   Recent Labs    08/10/21 1217  NA 140  K 4.4  CL 105  CO2 22  GLUCOSE 83  BUN 26  CREATININE 0.98  CALCIUM 9.4   CrCl cannot be calculated (Patient's most recent lab result is older than the maximum 21 days allowed.).  CMP Latest Ref Rng & Units 08/10/2021 01/20/2021 07/16/2020  Glucose 65 - 99 mg/dL 83 94 87  BUN 8 - 27 mg/dL 26 26 25   Creatinine 0.57 - 1.00 mg/dL 0.98 0.88 1.00  Sodium 134 - 144 mmol/L 140 145(H) 139  Potassium 3.5 - 5.2 mmol/L 4.4 4.4 4.1  Chloride 96 - 106  mmol/L 105 108(H) 100  CO2 20 - 29 mmol/L 22 23 23   Calcium 8.7 - 10.3 mg/dL 9.4 9.1 9.8  Total Protein 6.0 - 8.5 g/dL 7.1 6.6 7.4  Total Bilirubin 0.0 - 1.2 mg/dL 0.4 0.3 0.4  Alkaline Phos 44 - 121 IU/L 99 89 97  AST 0 - 40 IU/L 23 19 19   ALT 0 - 32 IU/L 16 16 16    CBC Latest Ref Rng & Units 08/10/2021 01/20/2021 07/16/2020  WBC 3.4 - 10.8 x10E3/uL 8.5 9.6 11.2(H)  Hemoglobin 11.1 - 15.9 g/dL 10.9(L) 10.9(L) 10.3(L)  Hematocrit 34.0 - 46.6 % 34.6 33.6(L) 32.3(L)  Platelets 150 - 450 x10E3/uL 249 257 326    Lipid Panel Recent Labs    08/10/21 1217  CHOL 159  TRIG 56  LDLCALC 72  HDL 75  CHOLHDL 2.1    HEMOGLOBIN A1C Lab Results  Component Value Date   HGBA1C 5.4 07/16/2020   TSH No results for input(s): TSH in the last 8760 hours.  External labs:   None  Allergies   Allergies  Allergen Reactions   Amoxicillin Itching   Penicillins Hives   Potassium-Containing Compounds Itching    Medications Prior to Visit:   Outpatient Medications Prior to Visit  Medication Sig Dispense Refill   Acetaminophen (TYLENOL PO) Take by mouth.     Ascorbic Acid (VITAMIN C PO) Take by mouth.     calcium carbonate (TUMS - DOSED IN MG ELEMENTAL CALCIUM) 500 MG chewable tablet Chew 2 tablets by mouth daily.      Cholecalciferol (VITAMIN D3 PO) 1 cap PO QD     Garlic 123XX123 MG CAPS Take by mouth.     glucosamine-chondroitin 500-400 MG tablet Take 1 tablet by mouth 3 (three) times daily.     levocetirizine (XYZAL) 5 MG tablet Take 5 mg by mouth daily.     lisinopril (ZESTRIL) 5 MG tablet TAKE 1 TABLET(5 MG) BY MOUTH DAILY 90 tablet 1   Multiple Vitamin (MULTIVITAMIN) tablet Take 1 tablet by mouth daily.     Multiple Vitamins-Minerals (PRESERVISION AREDS PO) Take by mouth.     Omega-3 Fatty Acids (FISH OIL) 1000 MG CAPS Take 1 tablet by mouth daily.     pantoprazole (PROTONIX) 40 MG tablet TAKE 1 TABLET(40 MG) BY MOUTH DAILY 30 MINUTES BEFORE BREAKFAST 30 tablet 11   cetirizine (ZYRTEC) 10  MG tablet Take 1 tablet by mouth daily.     No facility-administered medications prior to visit.   Final Medications at End of Visit    Current Meds  Medication Sig   Acetaminophen (TYLENOL PO) Take by mouth.   Ascorbic Acid (VITAMIN C  PO) Take by mouth.   calcium carbonate (TUMS - DOSED IN MG ELEMENTAL CALCIUM) 500 MG chewable tablet Chew 2 tablets by mouth daily.    Cholecalciferol (VITAMIN D3 PO) 1 cap PO QD   Garlic 123XX123 MG CAPS Take by mouth.   glucosamine-chondroitin 500-400 MG tablet Take 1 tablet by mouth 3 (three) times daily.   levocetirizine (XYZAL) 5 MG tablet Take 5 mg by mouth daily.   lisinopril (ZESTRIL) 5 MG tablet TAKE 1 TABLET(5 MG) BY MOUTH DAILY   Multiple Vitamin (MULTIVITAMIN) tablet Take 1 tablet by mouth daily.   Multiple Vitamins-Minerals (PRESERVISION AREDS PO) Take by mouth.   Omega-3 Fatty Acids (FISH OIL) 1000 MG CAPS Take 1 tablet by mouth daily.   pantoprazole (PROTONIX) 40 MG tablet TAKE 1 TABLET(40 MG) BY MOUTH DAILY 30 MINUTES BEFORE BREAKFAST   Radiology:   No results found.  Cardiac Studies:  PCV ECHOCARDIOGRAM COMPLETE 123XX123 Normal LV systolic function with EF 55%. Left ventricle cavity is normal in size. Moderate concentric remodeling of the left ventricle. Normal global wall motion. Normal diastolic filling pattern. Calculated EF 55%. Left atrial cavity is mildly dilated. Trileaflet aortic valve. No evidence of aortic stenosis. Trace aortic regurgitation. Trace mitral regurgitation. Mild calcification of the mitral valve annulus. No evidence of mitral valve stenosis. The aortic root is normal in size. Mild atherosclerotic changes in the aorta. No significant change from 09/06/2019.  PCV MYOCARDIAL PERFUSION WO LEXISCAN 11/30/2021 Nondiagnostic ECG stress. Myocardial perfusion is normal. Breast attenuation noted in both rest and stress images. Overall LV systolic function is normal without regional wall motion abnormalities. Stress LV  EF: 67%. No previous exam available for comparison. Low risk stress.  2 week event monitor 09/11-09/24/20: Dominant rhythm showed normal sinus rhythm. Symptoms of flutter skipped beats correlated with NSR with occasional PVC's and occasional episodes of PVC's in trigeminal and quadgeminal pattern. No A fib, SVT, or high degree AV block.  EKG:   11/19/2021: Sinus rhythm with 68 bpm.  Left atrial enlargement.  Left axis.  LVH with secondary ST-T abnormalities, however cannot exclude ischemia.  Compared EKG 08/24/2019, no significant change.  08/24/2019: Normal sinus rhythm at 78 bpm, left axis deviation, left anterior fasicular block, LVH. No evidence of ischemia.   Assessment     ICD-10-CM   1. Dyspnea on exertion  R06.09     2. Precordial pain  R07.2     3. Bilateral leg edema  R60.0     4. Palpitations  R00.2        Medications Discontinued During This Encounter  Medication Reason   cetirizine (ZYRTEC) 10 MG tablet     No orders of the defined types were placed in this encounter.    Recommendations:   Tricia Ferguson is a 88 y.o. female  with hypertension, GERD, originally evaluated by [place] for palpitations.   Patient was referred back to our office 11/19/2021 for preoperative risk stratification due to upcoming EGD.  Patient underwent EGD 01/11/2022 without issue.  Patient now presents for follow-up of cardiac testing.  Echocardiogram revealed preserved LVEF with mildly dilated LA and mild atherosclerosis of the aorta, otherwise no significant abnormality.  Stress test was overall low risk.  Reviewed and discussed results of stress test and echocardiogram, patient's questions were addressed to her satisfaction.  Dyspnea and leg edema remained stable and she has had no recurrence of chest pain since last office visit.  Patient does continue to have palpitations, therefore discussed with her options of  repeating cardiac monitor as well as empiric therapy with low-dose AV  nodal blocking agents.  Discussed risks versus benefits.  Patient recognizes benefits for further evaluation and management, however she chooses to hold off on repeat monitor or additional medical therapy at this time.  Blood pressure is well controlled.  Continue lisinopril.  Patient is otherwise stable from a cardiovascular standpoint.  Follow-up as needed.   Alethia Berthold, PA-C 01/21/2022, 3:12 PM Office: [phone]

## 2022-01-21 ENCOUNTER — Ambulatory Visit: Payer: Medicare Other | Admitting: Student

## 2022-01-21 ENCOUNTER — Other Ambulatory Visit: Payer: Self-pay

## 2022-01-21 ENCOUNTER — Encounter: Payer: Self-pay | Admitting: Student

## 2022-01-21 VITALS — BP 121/69 | HR 70 | Temp 97.2°F | Resp 16 | Ht 60.0 in | Wt 188.2 lb

## 2022-01-21 DIAGNOSIS — R072 Precordial pain: Secondary | ICD-10-CM | POA: Diagnosis not present

## 2022-01-21 DIAGNOSIS — R600 Localized edema: Secondary | ICD-10-CM

## 2022-01-21 DIAGNOSIS — R0609 Other forms of dyspnea: Secondary | ICD-10-CM

## 2022-01-21 DIAGNOSIS — R002 Palpitations: Secondary | ICD-10-CM | POA: Diagnosis not present

## 2022-02-03 DIAGNOSIS — H353221 Exudative age-related macular degeneration, left eye, with active choroidal neovascularization: Secondary | ICD-10-CM | POA: Diagnosis not present

## 2022-02-11 ENCOUNTER — Ambulatory Visit: Payer: Medicare Other | Admitting: Internal Medicine

## 2022-04-05 ENCOUNTER — Other Ambulatory Visit: Payer: Self-pay | Admitting: Internal Medicine

## 2022-04-05 DIAGNOSIS — Z1231 Encounter for screening mammogram for malignant neoplasm of breast: Secondary | ICD-10-CM

## 2022-04-06 ENCOUNTER — Telehealth: Payer: Self-pay

## 2022-04-06 NOTE — Telephone Encounter (Signed)
Unable to reach pt, left pt vm. Provider wants to make sure she is taking her Lisinopril 5mg  daily. We received a fax stating patient has not refilled.  ?

## 2022-04-09 ENCOUNTER — Other Ambulatory Visit: Payer: Self-pay | Admitting: Internal Medicine

## 2022-04-12 ENCOUNTER — Other Ambulatory Visit: Payer: Self-pay | Admitting: Internal Medicine

## 2022-05-03 DIAGNOSIS — H353221 Exudative age-related macular degeneration, left eye, with active choroidal neovascularization: Secondary | ICD-10-CM | POA: Diagnosis not present

## 2022-05-05 ENCOUNTER — Ambulatory Visit
Admission: RE | Admit: 2022-05-05 | Discharge: 2022-05-05 | Disposition: A | Discharge status: Home / Self Care | Payer: Medicare Other | Source: Ambulatory Visit | Attending: Internal Medicine | Admitting: Internal Medicine

## 2022-05-05 DIAGNOSIS — Z1231 Encounter for screening mammogram for malignant neoplasm of breast: Secondary | ICD-10-CM | POA: Diagnosis not present

## 2022-05-08 ENCOUNTER — Encounter (HOSPITAL_COMMUNITY): Payer: Self-pay | Admitting: *Deleted

## 2022-05-08 ENCOUNTER — Ambulatory Visit (HOSPITAL_COMMUNITY)
Admission: EM | Admit: 2022-05-08 | Discharge: 2022-05-08 | Disposition: A | Discharge status: Home / Self Care | Payer: Medicare Other | Attending: Emergency Medicine | Admitting: Emergency Medicine

## 2022-05-08 ENCOUNTER — Other Ambulatory Visit: Payer: Self-pay

## 2022-05-08 ENCOUNTER — Other Ambulatory Visit: Payer: Self-pay | Admitting: Nurse Practitioner

## 2022-05-08 DIAGNOSIS — M545 Low back pain, unspecified: Secondary | ICD-10-CM | POA: Diagnosis not present

## 2022-05-08 MED ORDER — CYCLOBENZAPRINE HCL 10 MG PO TABS: 10.0000 mg | ORAL_TABLET | Freq: Three times a day (TID) | ORAL | 0 refills | Status: DC | PRN

## 2022-05-08 MED ORDER — PREDNISONE 20 MG PO TABS: 40.0000 mg | ORAL_TABLET | Freq: Every day | ORAL | 0 refills | Status: DC

## 2022-05-08 NOTE — Discharge Instructions (Signed)
Your pain is most likely caused by irritation to the muscles.  Begin use of prednisone every morning with food for the next 5 days  May use the Flexeril that you are providers order to for additional comfort as needed, leave off of the medication may make you drowsy  You may use heating pad in 15 minute intervals as needed for additional comfort or you may find comfort in using ice in 10-15 minutes over affected area  Begin stretching affected area daily for 10 minutes as tolerated to further loosen muscles   When lying down place pillow underneath hip and behind back  Keep upcoming appointment with your primary care doctor for reevaluation of symptoms

## 2022-05-08 NOTE — ED Provider Notes (Signed)
[facility]    CSN: [id number] Arrival date & time: 05/08/22  1203      History   Chief Complaint Chief Complaint  Patient presents with   Hip Pain    HPI Tricia Ferguson is a 87 y.o. female.   Patient presents with left-sided hip pain radiating into the buttocks beginning 3 days ago.  Painful to do a directly onto the left side.  Able to bear weight to the left flank.  Endorses that prior to symptoms beginning she had a mammogram completed the day earlier where they were pulling on her body to get her onto the table.  Has attempted use of Tylenol and tramadol which has been ineffective.  Denies numbness or tingling.    Past Medical History:  Diagnosis Date   Chronic kidney disease    Hypertension    Peripheral vascular disease (HCC)    Vitamin D deficiency disease     Patient Active Problem List   Diagnosis Date Noted   Degenerative lumbar spinal stenosis 03/03/2021   Lumbar radiculitis 03/03/2021   Lumbago with sciatica, right side 08/12/2020   PAD (peripheral artery disease) (HCC) 09/06/2019   Essential hypertension, benign 01/04/2019   Estrogen deficiency 01/04/2019   Class 2 severe obesity due to excess calories with serious comorbidity and body mass index (BMI) of 35.0 to 35.9 in adult [facility]) 01/04/2019   Degeneration of lumbosacral intervertebral disc 07/31/2018    Past Surgical History:  Procedure Laterality Date   ABDOMINAL HYSTERECTOMY     ESOPHAGOGASTRODUODENOSCOPY (EGD) WITH PROPOFOL N/A 01/11/2022   Procedure: ESOPHAGOGASTRODUODENOSCOPY (EGD) WITH PROPOFOL;  Surgeon: Wyline Mood, MD;  Location: [facility];  Service: Gastroenterology;  Laterality: N/A;   HEMORRHOID SURGERY      OB History   No obstetric history on file.      Home Medications    Prior to Admission medications   Medication Sig Start Date End Date Taking? Authorizing Provider  Acetaminophen (TYLENOL PO) Take by mouth.    [provider]  Ascorbic  Acid (VITAMIN C PO) Take by mouth.    [provider]  calcium carbonate (TUMS - DOSED IN MG ELEMENTAL CALCIUM) 500 MG chewable tablet Chew 2 tablets by mouth daily.     [provider]  Cholecalciferol (VITAMIN D3 PO) 1 cap PO QD    [provider]  cyclobenzaprine (FLEXERIL) 10 MG tablet Take 1 tablet (10 mg total) by mouth 3 (three) times daily as needed for muscle spasms. 05/08/22   Arnette Felts, FNP  Garlic 1000 MG CAPS Take by mouth.    [provider]  glucosamine-chondroitin 500-400 MG tablet Take 1 tablet by mouth 3 (three) times daily.    [provider]  levocetirizine (XYZAL) 5 MG tablet TAKE 1 TABLET(5 MG) BY MOUTH EVERY EVENING 04/12/22   Dorothyann Peng, MD  lisinopril (ZESTRIL) 5 MG tablet TAKE 1 TABLET(5 MG) BY MOUTH DAILY 01/09/22   Dorothyann Peng, MD  Multiple Vitamin (MULTIVITAMIN) tablet Take 1 tablet by mouth daily.    [provider]  Multiple Vitamins-Minerals (PRESERVISION AREDS PO) Take by mouth.    [provider]  Omega-3 Fatty Acids (FISH OIL) 1000 MG CAPS Take 1 tablet by mouth daily.    [provider]  pantoprazole (PROTONIX) 40 MG tablet TAKE 1 TABLET(40 MG) BY MOUTH DAILY 30 MINUTES BEFORE BREAKFAST 04/09/22   Dorothyann Peng, MD    Family History Family History  Problem Relation Age of Onset   Hypertension Mother  Throat cancer Mother    Prostate cancer Father    Tremor Father    Hypertension Sister    Diabetes Sister    Hypertension Brother    Hypertension Sister    Diabetes Sister    Hypertension Sister     Social History Social History   Tobacco Use   Smoking status: Former    Packs/day: 1.00    Years: 20.00    Pack years: 20.00    Types: Cigarettes    Quit date: 2004    Years since quitting: 19.4   Smokeless tobacco: Never   Tobacco comments:    n/a  Vaping Use   Vaping Use: Never used  Substance Use Topics   Alcohol use: No   Drug use: No     Allergies    Amoxicillin, Penicillins, and Potassium-containing compounds   Review of Systems Review of Systems  Constitutional: Negative.   Respiratory: Negative.    Musculoskeletal:  Positive for back pain. Negative for arthralgias, gait problem, joint swelling, myalgias, neck pain and neck stiffness.  Skin: Negative.   Neurological: Negative.     Physical Exam Triage Vital Signs ED Triage Vitals  Enc Vitals Group     BP 05/08/22 1301 (!) 156/78     Pulse Rate 05/08/22 1301 70     Resp 05/08/22 1301 18     Temp 05/08/22 1301 98.6 F (37 C)     Temp src --      SpO2 05/08/22 1301 98 %     Weight --      Height --      Head Circumference --      Peak Flow --      Pain Score 05/08/22 1259 10     Pain Loc --      Pain Edu? --      Excl. in GC? --    No data found.  Updated Vital Signs BP (!) 156/78   Pulse 70   Temp 98.6 F (37 C)   Resp 18   SpO2 98%   Visual Acuity Right Eye Distance:   Left Eye Distance:   Bilateral Distance:    Right Eye Near:   Left Eye Near:    Bilateral Near:     Physical Exam Constitutional:      Appearance: Normal appearance.  HENT:     Head: Normocephalic.  Eyes:     Extraocular Movements: Extraocular movements intact.  Pulmonary:     Effort: Pulmonary effort is normal.  Musculoskeletal:     Comments: Tenderness present over the left lumbar region.,  No tenderness noted directly over the hip, no ecchymosis, swelling or deformity noted, able to bear weight, range of motion of the leg intact, range of motion of the back intact  Skin:    General: Skin is warm and dry.  Neurological:     Mental Status: She is alert and oriented to person, place, and time. Mental status is at baseline.  Psychiatric:        Mood and Affect: Mood normal.        Behavior: Behavior normal.     UC Treatments / Results  Labs (all labs ordered are listed, but only abnormal results are displayed) Labs Reviewed - No data to display  EKG   Radiology No  results found.  Procedures Procedures (including critical care time)  Medications Ordered in UC Medications - No data to display  Initial Impression / Assessment and Plan / UC Course  I have reviewed the triage vital signs and the nursing notes.  Pertinent labs & imaging results that were available during my care of the patient were reviewed by me and considered in my medical decision making (see chart for details).  Acute left-sided low back pain without sciatica  On exam tenderness is present more over the lumbar regions and over the hip, discussed with patient, most likely muscular irritation, discussed with patient, will defer imaging today, prednisone 40 mg burst prescribed for outpatient treatment, patient has received a Flexeril prescription from a different provider, okay to for medication to be used together but warned of drowsiness that occurs with muscle relaxers, may use RICE.  For additional comfort, has upcoming PCP appointment on Wednesday, may follow-up at that time Final Clinical Impressions(s) / UC Diagnoses   Final diagnoses:  None   Discharge Instructions   None    ED Prescriptions   None    PDMP not reviewed this encounter.   Valinda HoarWhite, Nikaela Coyne R, NP 05/08/22 1503

## 2022-05-08 NOTE — ED Triage Notes (Signed)
T reports hip pain started on WED. Lt hip pain.

## 2022-05-12 ENCOUNTER — Encounter: Payer: Self-pay | Admitting: Internal Medicine

## 2022-05-12 ENCOUNTER — Ambulatory Visit (INDEPENDENT_AMBULATORY_CARE_PROVIDER_SITE_OTHER): Payer: Medicare Other | Admitting: Internal Medicine

## 2022-05-12 VITALS — BP 122/80 | HR 85 | Temp 98.3°F | Ht 60.0 in | Wt 183.0 lb

## 2022-05-12 DIAGNOSIS — M48061 Spinal stenosis, lumbar region without neurogenic claudication: Secondary | ICD-10-CM

## 2022-05-12 DIAGNOSIS — N182 Chronic kidney disease, stage 2 (mild): Secondary | ICD-10-CM

## 2022-05-12 DIAGNOSIS — M25552 Pain in left hip: Secondary | ICD-10-CM | POA: Diagnosis not present

## 2022-05-12 DIAGNOSIS — I129 Hypertensive chronic kidney disease with stage 1 through stage 4 chronic kidney disease, or unspecified chronic kidney disease: Secondary | ICD-10-CM | POA: Diagnosis not present

## 2022-05-12 DIAGNOSIS — Z6835 Body mass index (BMI) 35.0-35.9, adult: Secondary | ICD-10-CM

## 2022-05-12 DIAGNOSIS — M5441 Lumbago with sciatica, right side: Secondary | ICD-10-CM | POA: Diagnosis not present

## 2022-05-12 DIAGNOSIS — M5459 Other low back pain: Secondary | ICD-10-CM | POA: Diagnosis not present

## 2022-05-12 NOTE — Progress Notes (Signed)
[name],acting as a Education administrator for Tricia Greenland, MD.,have documented all relevant documentation on the behalf of Tricia Greenland, MD,as directed by  Tricia Greenland, MD while in the presence of Tricia Greenland, MD.  This visit occurred during the SARS-CoV-2 public health emergency.  Safety protocols were in place, including screening questions prior to the visit, additional usage of staff PPE, and extensive cleaning of exam room while observing appropriate contact time as indicated for disinfecting solutions.  Subjective:     Patient ID: Tricia Ferguson , female    DOB: 16-Mar-1934 , 88 y.o.   MRN: [id number]   Chief Complaint  Patient presents with   Hypertension    HPI  She is here today for BP check.  She reports compliance with meds. Patient stated she is having a lot of pain in her hip.  She feels like she strained a muscle in her back while doing her mammogram last Wednesday. She denies having a recent fall. States she was twisted in a strange position during her mammogram. Denies urinary/fecal incontinence. Denies LE weakness.   Hypertension This is a chronic problem. The current episode started more than 1 year ago. The problem has been gradually improving since onset. The problem is controlled. Pertinent negatives include no blurred vision, chest pain, palpitations or shortness of breath. Risk factors for coronary artery disease include obesity and post-menopausal state. The current treatment provides moderate improvement.     Past Medical History:  Diagnosis Date   Chronic kidney disease    Hypertension    Peripheral vascular disease ([place])    Vitamin D deficiency disease      Family History  Problem Relation Age of Onset   Hypertension Mother    Throat cancer Mother    Prostate cancer Father    Tremor Father    Hypertension Sister    Diabetes Sister    Hypertension Brother    Hypertension Sister    Diabetes Sister    Hypertension Sister       Current Outpatient Medications:    Acetaminophen (TYLENOL PO), Take by mouth., Disp: , Rfl:    Ascorbic Acid (VITAMIN C PO), Take by mouth., Disp: , Rfl:    calcium carbonate (TUMS - DOSED IN MG ELEMENTAL CALCIUM) 500 MG chewable tablet, Chew 2 tablets by mouth daily. , Disp: , Rfl:    Cholecalciferol (VITAMIN D3 PO), 1 cap PO QD, Disp: , Rfl:    cyclobenzaprine (FLEXERIL) 10 MG tablet, Take 1 tablet (10 mg total) by mouth 3 (three) times daily as needed for muscle spasms., Disp: 30 tablet, Rfl: 0   Garlic 5397 MG CAPS, Take by mouth., Disp: , Rfl:    glucosamine-chondroitin 500-400 MG tablet, Take 1 tablet by mouth 3 (three) times daily., Disp: , Rfl:    levocetirizine (XYZAL) 5 MG tablet, TAKE 1 TABLET(5 MG) BY MOUTH EVERY EVENING, Disp: 90 tablet, Rfl: 1   lisinopril (ZESTRIL) 5 MG tablet, TAKE 1 TABLET(5 MG) BY MOUTH DAILY, Disp: 90 tablet, Rfl: 1   Multiple Vitamin (MULTIVITAMIN) tablet, Take 1 tablet by mouth daily., Disp: , Rfl:    Multiple Vitamins-Minerals (PRESERVISION AREDS PO), Take by mouth., Disp: , Rfl:    Omega-3 Fatty Acids (FISH OIL) 1000 MG CAPS, Take 1 tablet by mouth daily., Disp: , Rfl:    pantoprazole (PROTONIX) 40 MG tablet, TAKE 1 TABLET(40 MG) BY MOUTH DAILY 30 MINUTES BEFORE BREAKFAST, Disp: 30 tablet, Rfl: 11   Allergies  Allergen Reactions  Amoxicillin Itching   Penicillins Hives   Potassium-Containing Compounds Itching     Review of Systems  Constitutional: Negative.   Eyes:  Negative for blurred vision.  Respiratory: Negative.  Negative for shortness of breath.   Cardiovascular: Negative.  Negative for chest pain and palpitations.  Gastrointestinal: Negative.   Musculoskeletal:  Positive for arthralgias and back pain.  Neurological: Negative.   Psychiatric/Behavioral: Negative.       Today's Vitals   05/12/22 1556  BP: 122/80  Pulse: 85  Temp: 98.3 F (36.8 C)  Weight: 183 lb (83 kg)  Height: 5' (1.524 m)  PainSc: 10-Worst pain ever   PainLoc: Hip   Body mass index is 35.74 kg/m.  Wt Readings from Last 3 Encounters:  05/12/22 183 lb (83 kg)  01/21/22 188 lb 3.2 oz (85.4 kg)  01/18/22 186 lb (84.4 kg)    Objective:  Physical Exam Vitals and nursing note reviewed.  Constitutional:      Appearance: Normal appearance.  HENT:     Head: Normocephalic and atraumatic.  Eyes:     Extraocular Movements: Extraocular movements intact.  Cardiovascular:     Rate and Rhythm: Normal rate and regular rhythm.     Heart sounds: Normal heart sounds.  Pulmonary:     Effort: Pulmonary effort is normal.     Breath sounds: Normal breath sounds.  Musculoskeletal:        General: Tenderness present.     Cervical back: Normal range of motion.  Skin:    General: Skin is warm.  Neurological:     General: No focal deficit present.     Mental Status: She is alert.  Psychiatric:        Mood and Affect: Mood normal.        Behavior: Behavior normal.         Assessment And Plan:     1. 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 NO med changes. She is encouraged to follow a low sodium diet.   2. Chronic renal disease, stage II Comments: Chronic, I will check renal function today. Encouraged to stay well hydrated. avoid NSAIDs and keep BP controlled to decrease risk of CKD progression.  - CMP14+EGFR - Protein electrophoresis, serum  3. Degenerative lumbar spinal stenosis Comments: Chronic, it appears she may be having a flare. I will send rx tizanidine to use nightly prn.  4. Acute left-sided low back pain with right-sided sciatica Comments: She has already been prescribed cyclobenzaprine by Ortho. I showed her several stretching exercises to perform at home. Agrees to see Ortho if sx persist.   5. Hip pain, left Comments: I will check an arthritis panel as below. Her sx are likely due to OA. She is encouraged to  f/u with Ortho for radiographic studies/further evaluation.  - ANA, IFA (with reflex) - CYCLIC CITRUL PEPTIDE ANTIBODY, IGG/IGA - Rheumatoid factor - Sedimentation rate - Uric acid  6. Class 2 severe obesity due to excess calories with serious comorbidity and body mass index (BMI) of 35.0 to 35.9 in adult ([facility]  She is encouraged to strive for BMI less than 30 to decrease cardiac risk. Advised to aim for at least 150 minutes of exercise per week.   Patient was given opportunity to ask questions. Patient verbalized understanding of the plan and was able to repeat key elements of the plan. All questions were answered to their satisfaction.  I, Tricia Greenland, MD, have reviewed all documentation for this visit. The documentation on 05/12/22 for the exam, diagnosis, procedures, and orders are all accurate and complete.   IF YOU HAVE BEEN REFERRED TO A SPECIALIST, IT MAY TAKE 1-2 WEEKS TO SCHEDULE/PROCESS THE REFERRAL. IF YOU HAVE NOT HEARD FROM US/SPECIALIST IN TWO WEEKS, PLEASE GIVE [place] A CALL AT [phone].   THE PATIENT IS ENCOURAGED TO PRACTICE SOCIAL DISTANCING DUE TO THE COVID-19 PANDEMIC.

## 2022-05-12 NOTE — Patient Instructions (Signed)
Hypertension, Adult ?Hypertension is another name for high blood pressure. High blood pressure forces your heart to work harder to pump blood. This can cause problems over time. ?There are two numbers in a blood pressure reading. There is a top number (systolic) over a bottom number (diastolic). It is best to have a blood pressure that is below 120/80. ?What are the causes? ?The cause of this condition is not known. Some other conditions can lead to high blood pressure. ?What increases the risk? ?Some lifestyle factors can make you more likely to develop high blood pressure: ?Smoking. ?Not getting enough exercise or physical activity. ?Being overweight. ?Having too much fat, sugar, calories, or salt (sodium) in your diet. ?Drinking too much alcohol. ?Other risk factors include: ?Having any of these conditions: ?Heart disease. ?Diabetes. ?High cholesterol. ?Kidney disease. ?Obstructive sleep apnea. ?Having a family history of high blood pressure and high cholesterol. ?Age. The risk increases with age. ?Stress. ?What are the signs or symptoms? ?High blood pressure may not cause symptoms. Very high blood pressure (hypertensive crisis) may cause: ?Headache. ?Fast or uneven heartbeats (palpitations). ?Shortness of breath. ?Nosebleed. ?Vomiting or feeling like you may vomit (nauseous). ?Changes in how you see. ?Very bad chest pain. ?Feeling dizzy. ?Seizures. ?How is this treated? ?This condition is treated by making healthy lifestyle changes, such as: ?Eating healthy foods. ?Exercising more. ?Drinking less alcohol. ?Your doctor may prescribe medicine if lifestyle changes do not help enough and if: ?Your top number is above 130. ?Your bottom number is above 80. ?Your personal target blood pressure may vary. ?Follow these instructions at home: ?Eating and drinking ? ?If told, follow the DASH eating plan. To follow this plan: ?Fill one half of your plate at each meal with fruits and vegetables. ?Fill one fourth of your plate  at each meal with whole grains. Whole grains include whole-wheat pasta, brown rice, and whole-grain bread. ?Eat or drink low-fat dairy products, such as skim milk or low-fat yogurt. ?Fill one fourth of your plate at each meal with low-fat (lean) proteins. Low-fat proteins include fish, chicken without skin, eggs, beans, and tofu. ?Avoid fatty meat, cured and processed meat, or chicken with skin. ?Avoid pre-made or processed food. ?Limit the amount of salt in your diet to less than 1,500 mg each day. ?Do not drink alcohol if: ?Your doctor tells you not to drink. ?You are pregnant, may be pregnant, or are planning to become pregnant. ?If you drink alcohol: ?Limit how much you have to: ?0-1 drink a day for women. ?0-2 drinks a day for men. ?Know how much alcohol is in your drink. In the [place] one drink equals one 12 oz bottle of beer (355 mL), one 5 oz glass of wine (148 mL), or one 1? oz glass of hard liquor (44 mL). ?Lifestyle ? ?Work with your doctor to stay at a healthy weight or to lose weight. Ask your doctor what the best weight is for you. ?Get at least 30 minutes of exercise that causes your heart to beat faster (aerobic exercise) most days of the week. This may include walking, swimming, or biking. ?Get at least 30 minutes of exercise that strengthens your muscles (resistance exercise) at least 3 days a week. This may include lifting weights or doing Pilates. ?Do not smoke or use any products that contain nicotine or tobacco. If you need help quitting, ask your doctor. ?Check your blood pressure at home as told by your doctor. ?Keep all follow-up visits. ?Medicines ?Take over-the-counter and prescription medicines   only as told by your doctor. Follow directions carefully. ?Do not skip doses of blood pressure medicine. The medicine does not work as well if you skip doses. Skipping doses also puts you at risk for problems. ?Ask your doctor about side effects or reactions to medicines that you should watch  for. ?Contact a doctor if: ?You think you are having a reaction to the medicine you are taking. ?You have headaches that keep coming back. ?You feel dizzy. ?You have swelling in your ankles. ?You have trouble with your vision. ?Get help right away if: ?You get a very bad headache. ?You start to feel mixed up (confused). ?You feel weak or numb. ?You feel faint. ?You have very bad pain in your: ?Chest. ?Belly (abdomen). ?You vomit more than once. ?You have trouble breathing. ?These symptoms may be an emergency. Get help right away. Call 911. ?Do not wait to see if the symptoms will go away. ?Do not drive yourself to the hospital. ?Summary ?Hypertension is another name for high blood pressure. ?High blood pressure forces your heart to work harder to pump blood. ?For most people, a normal blood pressure is less than 120/80. ?Making healthy choices can help lower blood pressure. If your blood pressure does not get lower with healthy choices, you may need to take medicine. ?This information is not intended to replace advice given to you by your health care provider. Make sure you discuss any questions you have with your health care provider. ?Document Revised: 09/17/2021 Document Reviewed: 09/17/2021 ?Elsevier Patient Education ? 2023 [employer] ? ?

## 2022-05-17 LAB — CMP14+EGFR
ALT: 20 IU/L (ref 0–32)
AST: 19 IU/L (ref 0–40)
Albumin/Globulin Ratio: 1.5 (ref 1.2–2.2)
Albumin: 4.1 g/dL (ref 3.6–4.6)
Alkaline Phosphatase: 90 IU/L (ref 44–121)
BUN/Creatinine Ratio: 23 (ref 12–28)
BUN: 23 mg/dL (ref 8–27)
Bilirubin Total: 0.2 mg/dL (ref 0.0–1.2)
CO2: 23 mmol/L (ref 20–29)
Calcium: 9.4 mg/dL (ref 8.7–10.3)
Chloride: 104 mmol/L (ref 96–106)
Creatinine, Ser: 0.99 mg/dL (ref 0.57–1.00)
Globulin, Total: 2.7 g/dL (ref 1.5–4.5)
Glucose: 137 mg/dL — ABNORMAL HIGH (ref 70–99)
Potassium: 4.4 mmol/L (ref 3.5–5.2)
Sodium: 141 mmol/L (ref 134–144)
Total Protein: 6.8 g/dL (ref 6.0–8.5)
eGFR: 56 mL/min/{1.73_m2} — ABNORMAL LOW (ref >59)

## 2022-05-17 LAB — SEDIMENTATION RATE: Sed Rate: 31 mm/hr (ref 0–40)

## 2022-05-17 LAB — PROTEIN ELECTROPHORESIS, SERUM
A/G Ratio: 1.2 (ref 0.7–1.7)
Albumin ELP: 3.7 g/dL (ref 2.9–4.4)
Alpha 1: 0.2 g/dL (ref 0.0–0.4)
Alpha 2: 0.8 g/dL (ref 0.4–1.0)
Beta: 1 g/dL (ref 0.7–1.3)
Gamma Globulin: 1.2 g/dL (ref 0.4–1.8)
Globulin, Total: 3.1 g/dL (ref 2.2–3.9)

## 2022-05-17 LAB — CYCLIC CITRUL PEPTIDE ANTIBODY, IGG/IGA: Cyclic Citrullin Peptide Ab: 8 units (ref 0–19)

## 2022-05-17 LAB — ANTINUCLEAR ANTIBODIES, IFA: ANA Titer 1: NEGATIVE

## 2022-05-17 LAB — RHEUMATOID FACTOR: Rheumatoid fact SerPl-aCnc: <10 IU/mL (ref <14.0)

## 2022-05-17 LAB — URIC ACID: Uric Acid: 4.6 mg/dL (ref 3.1–7.9)

## 2022-05-18 DIAGNOSIS — M25552 Pain in left hip: Secondary | ICD-10-CM | POA: Diagnosis not present

## 2022-05-27 DIAGNOSIS — M5137 Other intervertebral disc degeneration, lumbosacral region: Secondary | ICD-10-CM | POA: Diagnosis not present

## 2022-05-31 DIAGNOSIS — M5137 Other intervertebral disc degeneration, lumbosacral region: Secondary | ICD-10-CM | POA: Diagnosis not present

## 2022-06-03 ENCOUNTER — Other Ambulatory Visit: Payer: Self-pay | Admitting: Nurse Practitioner

## 2022-06-03 DIAGNOSIS — M25552 Pain in left hip: Secondary | ICD-10-CM

## 2022-06-03 DIAGNOSIS — M47896 Other spondylosis, lumbar region: Secondary | ICD-10-CM | POA: Diagnosis not present

## 2022-06-03 MED ORDER — TRAMADOL HCL 50 MG PO TABS: 50.0000 mg | ORAL_TABLET | Freq: Four times a day (QID) | ORAL | 0 refills | Status: AC | PRN

## 2022-06-04 DIAGNOSIS — M5137 Other intervertebral disc degeneration, lumbosacral region: Secondary | ICD-10-CM | POA: Diagnosis not present

## 2022-06-08 DIAGNOSIS — M5137 Other intervertebral disc degeneration, lumbosacral region: Secondary | ICD-10-CM | POA: Diagnosis not present

## 2022-06-11 DIAGNOSIS — M5137 Other intervertebral disc degeneration, lumbosacral region: Secondary | ICD-10-CM | POA: Diagnosis not present

## 2022-06-14 DIAGNOSIS — M5137 Other intervertebral disc degeneration, lumbosacral region: Secondary | ICD-10-CM | POA: Diagnosis not present

## 2022-06-17 DIAGNOSIS — M5137 Other intervertebral disc degeneration, lumbosacral region: Secondary | ICD-10-CM | POA: Diagnosis not present

## 2022-06-18 DIAGNOSIS — M5416 Radiculopathy, lumbar region: Secondary | ICD-10-CM | POA: Diagnosis not present

## 2022-06-22 DIAGNOSIS — M5137 Other intervertebral disc degeneration, lumbosacral region: Secondary | ICD-10-CM | POA: Diagnosis not present

## 2022-06-25 DIAGNOSIS — M5137 Other intervertebral disc degeneration, lumbosacral region: Secondary | ICD-10-CM | POA: Diagnosis not present

## 2022-07-05 DIAGNOSIS — M5137 Other intervertebral disc degeneration, lumbosacral region: Secondary | ICD-10-CM | POA: Diagnosis not present

## 2022-07-09 DIAGNOSIS — M5137 Other intervertebral disc degeneration, lumbosacral region: Secondary | ICD-10-CM | POA: Diagnosis not present

## 2022-07-13 DIAGNOSIS — M5137 Other intervertebral disc degeneration, lumbosacral region: Secondary | ICD-10-CM | POA: Diagnosis not present

## 2022-07-15 ENCOUNTER — Other Ambulatory Visit: Payer: Self-pay | Admitting: Internal Medicine

## 2022-07-15 DIAGNOSIS — M48061 Spinal stenosis, lumbar region without neurogenic claudication: Secondary | ICD-10-CM | POA: Diagnosis not present

## 2022-07-15 DIAGNOSIS — M5416 Radiculopathy, lumbar region: Secondary | ICD-10-CM | POA: Diagnosis not present

## 2022-07-16 DIAGNOSIS — M5137 Other intervertebral disc degeneration, lumbosacral region: Secondary | ICD-10-CM | POA: Diagnosis not present

## 2022-07-20 DIAGNOSIS — M5137 Other intervertebral disc degeneration, lumbosacral region: Secondary | ICD-10-CM | POA: Diagnosis not present

## 2022-07-22 DIAGNOSIS — M5137 Other intervertebral disc degeneration, lumbosacral region: Secondary | ICD-10-CM | POA: Diagnosis not present

## 2022-07-27 DIAGNOSIS — M5137 Other intervertebral disc degeneration, lumbosacral region: Secondary | ICD-10-CM | POA: Diagnosis not present

## 2022-07-29 DIAGNOSIS — M5137 Other intervertebral disc degeneration, lumbosacral region: Secondary | ICD-10-CM | POA: Diagnosis not present

## 2022-08-02 DIAGNOSIS — H353111 Nonexudative age-related macular degeneration, right eye, early dry stage: Secondary | ICD-10-CM | POA: Diagnosis not present

## 2022-08-02 DIAGNOSIS — H35453 Secondary pigmentary degeneration, bilateral: Secondary | ICD-10-CM | POA: Diagnosis not present

## 2022-08-02 DIAGNOSIS — H353221 Exudative age-related macular degeneration, left eye, with active choroidal neovascularization: Secondary | ICD-10-CM | POA: Diagnosis not present

## 2022-08-02 DIAGNOSIS — H35363 Drusen (degenerative) of macula, bilateral: Secondary | ICD-10-CM | POA: Diagnosis not present

## 2022-08-03 DIAGNOSIS — M5137 Other intervertebral disc degeneration, lumbosacral region: Secondary | ICD-10-CM | POA: Diagnosis not present

## 2022-08-05 DIAGNOSIS — M5137 Other intervertebral disc degeneration, lumbosacral region: Secondary | ICD-10-CM | POA: Diagnosis not present

## 2022-08-09 DIAGNOSIS — H353221 Exudative age-related macular degeneration, left eye, with active choroidal neovascularization: Secondary | ICD-10-CM | POA: Diagnosis not present

## 2022-08-10 DIAGNOSIS — M5137 Other intervertebral disc degeneration, lumbosacral region: Secondary | ICD-10-CM | POA: Diagnosis not present

## 2022-08-12 DIAGNOSIS — M5137 Other intervertebral disc degeneration, lumbosacral region: Secondary | ICD-10-CM | POA: Diagnosis not present

## 2022-08-17 ENCOUNTER — Ambulatory Visit (INDEPENDENT_AMBULATORY_CARE_PROVIDER_SITE_OTHER): Payer: Medicare Other | Admitting: Internal Medicine

## 2022-08-17 ENCOUNTER — Encounter: Payer: Self-pay | Admitting: Internal Medicine

## 2022-08-17 VITALS — BP 118/70 | HR 71 | Temp 98.1°F | Ht 60.0 in | Wt 182.0 lb

## 2022-08-17 DIAGNOSIS — Z0000 Encounter for general adult medical examination without abnormal findings: Secondary | ICD-10-CM

## 2022-08-17 DIAGNOSIS — R269 Unspecified abnormalities of gait and mobility: Secondary | ICD-10-CM | POA: Diagnosis not present

## 2022-08-17 DIAGNOSIS — N189 Chronic kidney disease, unspecified: Secondary | ICD-10-CM

## 2022-08-17 DIAGNOSIS — Z6835 Body mass index (BMI) 35.0-35.9, adult: Secondary | ICD-10-CM

## 2022-08-17 DIAGNOSIS — I129 Hypertensive chronic kidney disease with stage 1 through stage 4 chronic kidney disease, or unspecified chronic kidney disease: Secondary | ICD-10-CM | POA: Diagnosis not present

## 2022-08-17 DIAGNOSIS — R7309 Other abnormal glucose: Secondary | ICD-10-CM | POA: Diagnosis not present

## 2022-08-17 LAB — POCT URINALYSIS DIPSTICK
Bilirubin, UA: NEGATIVE
Blood, UA: NEGATIVE
Glucose, UA: NEGATIVE
Ketones, UA: NEGATIVE
Leukocytes, UA: NEGATIVE
Nitrite, UA: NEGATIVE
Protein, UA: NEGATIVE
Spec Grav, UA: 1.01 (ref 1.010–1.025)
Urobilinogen, UA: 0.2 E.U./dL
pH, UA: 5.5 (ref 5.0–8.0)

## 2022-08-17 NOTE — Patient Instructions (Signed)

## 2022-08-17 NOTE — Progress Notes (Signed)
Tricia Ferguson,acting as a Education administrator for Tricia Greenland, MD.,have documented all relevant documentation on the behalf of Tricia Greenland, MD,as directed by  Tricia Greenland, MD while in the presence of Tricia Greenland, MD.   Subjective:     Patient ID: Tricia Ferguson , female    DOB: 1934/04/13 , 88 y.o.   MRN: [id number]   Chief Complaint  Patient presents with   Annual Exam   Hypertension    HPI  She is here today for a full physical examination. She is no longer seen by GYN. She has no new concerns or complaints at this time. She reports compliance with meds. Denies headaches, chest pain and shortness of breath. Wants to be examined in chair, she does not wish to get onto exam table.   Hypertension This is a chronic problem. The current episode started more than 1 year ago. The problem has been gradually improving since onset. The problem is controlled. Pertinent negatives include no blurred vision. Risk factors for coronary artery disease include obesity, post-menopausal state and sedentary lifestyle. Past treatments include ACE inhibitors. The current treatment provides moderate improvement. Compliance problems include exercise.      Past Medical History:  Diagnosis Date   Chronic kidney disease    Hypertension    Peripheral vascular disease ([place])    Vitamin D deficiency disease      Family History  Problem Relation Age of Onset   Hypertension Mother    Throat cancer Mother    Prostate cancer Father    Tremor Father    Hypertension Sister    Diabetes Sister    Hypertension Brother    Hypertension Sister    Diabetes Sister    Hypertension Sister      Current Outpatient Medications:    Acetaminophen (TYLENOL PO), Take by mouth., Disp: , Rfl:    Ascorbic Acid (VITAMIN C PO), Take by mouth., Disp: , Rfl:    calcium carbonate (TUMS - DOSED IN MG ELEMENTAL CALCIUM) 500 MG chewable tablet, Chew 2 tablets by mouth daily. , Disp: , Rfl:    Cholecalciferol  (VITAMIN D3 PO), 1 cap PO QD, Disp: , Rfl:    cyclobenzaprine (FLEXERIL) 10 MG tablet, Take 1 tablet (10 mg total) by mouth 3 (three) times daily as needed for muscle spasms., Disp: 30 tablet, Rfl: 0   Garlic 4098 MG CAPS, Take by mouth., Disp: , Rfl:    glucosamine-chondroitin 500-400 MG tablet, Take 1 tablet by mouth 3 (three) times daily., Disp: , Rfl:    levocetirizine (XYZAL) 5 MG tablet, TAKE 1 TABLET(5 MG) BY MOUTH EVERY EVENING, Disp: 90 tablet, Rfl: 1   lisinopril (ZESTRIL) 5 MG tablet, TAKE 1 TABLET(5 MG) BY MOUTH DAILY, Disp: 90 tablet, Rfl: 1   Multiple Vitamin (MULTIVITAMIN) tablet, Take 1 tablet by mouth daily., Disp: , Rfl:    Multiple Vitamins-Minerals (PRESERVISION AREDS PO), Take by mouth., Disp: , Rfl:    Omega-3 Fatty Acids (FISH OIL) 1000 MG CAPS, Take 1 tablet by mouth daily., Disp: , Rfl:    pantoprazole (PROTONIX) 40 MG tablet, TAKE 1 TABLET(40 MG) BY MOUTH DAILY 30 MINUTES BEFORE BREAKFAST, Disp: 30 tablet, Rfl: 11   traMADol (ULTRAM) 50 MG tablet, Take 1 tablet (50 mg total) by mouth every 6 (six) hours as needed., Disp: 20 tablet, Rfl: 0   Allergies  Allergen Reactions   Amoxicillin Itching   Penicillins Hives   Potassium-Containing Compounds Itching      The  patient states she uses post menopausal status for birth control. Last LMP was No LMP recorded. Patient has had a hysterectomy.. Negative for Dysmenorrhea. Negative for: breast discharge, breast lump(s), breast pain and breast self exam. Associated symptoms include abnormal vaginal bleeding. Pertinent negatives include abnormal bleeding (hematology), anxiety, decreased libido, depression, difficulty falling sleep, dyspareunia, history of infertility, nocturia, sexual dysfunction, sleep disturbances, urinary incontinence, urinary urgency, vaginal discharge and vaginal itching. Diet regular.The patient states her exercise level is    . The patient's tobacco use is:  Social History   Tobacco Use  Smoking Status  Former   Packs/day: 1.00   Years: 20.00   Total pack years: 20.00   Types: Cigarettes   Quit date: 2004   Years since quitting: 19.7  Smokeless Tobacco Never  Tobacco Comments   n/a  . She has been exposed to passive smoke. The patient's alcohol use is:  Social History   Substance and Sexual Activity  Alcohol Use No    Review of Systems  Constitutional: Negative.   HENT: Negative.    Eyes: Negative.  Negative for blurred vision.  Respiratory: Negative.    Cardiovascular: Negative.   Gastrointestinal: Negative.   Endocrine: Negative.   Genitourinary: Negative.   Musculoskeletal: Negative.   Skin: Negative.   Allergic/Immunologic: Negative.   Neurological: Negative.   Hematological: Negative.   Psychiatric/Behavioral: Negative.       Today's Vitals   08/17/22 1013  BP: 118/70  Pulse: 71  Temp: 98.1 F (36.7 C)  Weight: 182 lb (82.6 kg)  Height: 5' (1.524 m)  PainSc: 5   PainLoc: Back   Body mass index is 35.54 kg/m.  Wt Readings from Last 3 Encounters:  08/23/22 182 lb (82.6 kg)  08/17/22 182 lb (82.6 kg)  05/12/22 183 lb (83 kg)     Objective:  Physical Exam Vitals and nursing note reviewed.  Constitutional:      Appearance: Normal appearance.     Comments: Examined while in chair  HENT:     Head: Normocephalic and atraumatic.     Right Ear: Tympanic membrane, ear canal and external ear normal.     Left Ear: Tympanic membrane, ear canal and external ear normal.     Nose:     Comments: Masked     Mouth/Throat:     Comments: Masked  Eyes:     Extraocular Movements: Extraocular movements intact.     Conjunctiva/sclera: Conjunctivae normal.     Pupils: Pupils are equal, round, and reactive to light.  Cardiovascular:     Rate and Rhythm: Normal rate and regular rhythm.     Pulses: Normal pulses.     Heart sounds: Normal heart sounds.  Pulmonary:     Effort: Pulmonary effort is normal.     Breath sounds: Normal breath sounds.  Chest:  Breasts:     Tanner Score is 5.     Right: Normal.     Left: Normal.     Comments: Pendulous Abdominal:     General: Abdomen is flat. Bowel sounds are normal.     Palpations: Abdomen is soft.  Genitourinary:    Comments: deferred Musculoskeletal:        General: Normal range of motion.     Cervical back: Normal range of motion and neck supple.  Skin:    General: Skin is warm and dry.  Neurological:     General: No focal deficit present.     Mental Status: She is alert and oriented  to person, place, and time.  Psychiatric:        Mood and Affect: Mood normal.        Behavior: Behavior normal.        Judgment: Judgment normal.      Assessment And Plan:     1. Encounter for general adult medical examination w/o abnormal findings Comments: A full exam was performed. Importance of monthly self breast exams was discussed with the patient. PATIENT IS ADVISED TO GET 30-45 MINUTES REGULAR EXERCISE NO LESS THAN FOUR TO FIVE DAYS PER WEEK - BOTH WEIGHTBEARING EXERCISES AND AEROBIC ARE RECOMMENDED.  PATIENT IS ADVISED TO FOLLOW A HEALTHY DIET WITH AT LEAST SIX FRUITS/VEGGIES PER DAY, DECREASE INTAKE OF RED MEAT, AND TO INCREASE FISH INTAKE TO TWO DAYS PER WEEK.  MEATS/FISH SHOULD NOT BE FRIED, BAKED OR BROILED IS PREFERABLE.  IT IS ALSO IMPORTANT TO CUT BACK ON YOUR SUGAR INTAKE. PLEASE AVOID ANYTHING WITH ADDED SUGAR, CORN SYRUP OR OTHER SWEETENERS. IF YOU MUST USE A SWEETENER, YOU CAN TRY STEVIA. IT IS ALSO IMPORTANT TO AVOID ARTIFICIALLY SWEETENERS AND DIET BEVERAGES. LASTLY, I SUGGEST WEARING SPF 50 SUNSCREEN ON EXPOSED PARTS AND ESPECIALLY WHEN IN THE DIRECT SUNLIGHT FOR AN EXTENDED PERIOD OF TIME.  PLEASE AVOID FAST FOOD RESTAURANTS AND INCREASE YOUR WATER INTAKE.  2. Parenchymal renal hypertension, stage 1 through stage 4 or unspecified chronic kidney disease Comments: Chronic, well controlled. EKG performed, NSR w/ LAD, voltage criteria for LVH, nonspecific T abnormality. She is encouraged to follow  low sodium diet. F/u 6 mos - POCT Urinalysis Dipstick (81002) - Microalbumin / Creatinine Urine Ratio - EKG 12-Lead - CBC - Lipid panel - Hemoglobin A1c - BMP8+eGFR  3. Gait abnormality Comments: I will send rx rolling walker to [facility]. She does not wish to have PT at this time.   4. Abnormal glucose Comments: Her a1c has been elevated in the past. I will recheck this today.  She is encouraged to limit her intake of sweetened drinks/foods and processed meats.  - Hemoglobin A1c - BMP8+eGFR  5. Class 2 severe obesity due to excess calories with serious comorbidity and body mass index (BMI) of 35.0 to 35.9 in adult [facility]) Comments: She is encouraged to aim for at least 150 minutes of exercise per week, while striving for BMI<30 to decrease cardiac risk.   Patient was given opportunity to ask questions. Patient verbalized understanding of the plan and was able to repeat key elements of the plan. All questions were answered to their satisfaction.   I, Tricia Greenland, MD, have reviewed all documentation for this visit. The documentation on 08/17/22 for the exam, diagnosis, procedures, and orders are all accurate and complete.   THE PATIENT IS ENCOURAGED TO PRACTICE SOCIAL DISTANCING DUE TO THE COVID-19 PANDEMIC.

## 2022-08-18 LAB — LIPID PANEL
Chol/HDL Ratio: 2.2 ratio (ref 0.0–4.4)
Cholesterol, Total: 152 mg/dL (ref 100–199)
HDL: 70 mg/dL (ref >39)
LDL Chol Calc (NIH): 69 mg/dL (ref 0–99)
Triglycerides: 62 mg/dL (ref 0–149)
VLDL Cholesterol Cal: 13 mg/dL (ref 5–40)

## 2022-08-18 LAB — MICROALBUMIN / CREATININE URINE RATIO
Creatinine, Urine: 26.7 mg/dL
Microalb/Creat Ratio: <11 mg/g creat (ref 0–29)
Microalbumin, Urine: <3 ug/mL

## 2022-08-18 LAB — CBC
Hematocrit: 34.3 % (ref 34.0–46.6)
Hemoglobin: 11 g/dL — ABNORMAL LOW (ref 11.1–15.9)
MCH: 30.1 pg (ref 26.6–33.0)
MCHC: 32.1 g/dL (ref 31.5–35.7)
MCV: 94 fL (ref 79–97)
Platelets: 256 10*3/uL (ref 150–450)
RBC: 3.66 x10E6/uL — ABNORMAL LOW (ref 3.77–5.28)
RDW: 12.1 % (ref 11.7–15.4)
WBC: 8.7 10*3/uL (ref 3.4–10.8)

## 2022-08-18 LAB — HEMOGLOBIN A1C
Est. average glucose Bld gHb Est-mCnc: 103 mg/dL
Hgb A1c MFr Bld: 5.2 % (ref 4.8–5.6)

## 2022-08-18 LAB — BMP8+EGFR
BUN/Creatinine Ratio: 31 — ABNORMAL HIGH (ref 12–28)
BUN: 30 mg/dL — ABNORMAL HIGH (ref 8–27)
CO2: 22 mmol/L (ref 20–29)
Calcium: 9.6 mg/dL (ref 8.7–10.3)
Chloride: 103 mmol/L (ref 96–106)
Creatinine, Ser: 0.97 mg/dL (ref 0.57–1.00)
Glucose: 87 mg/dL (ref 70–99)
Potassium: 4.1 mmol/L (ref 3.5–5.2)
Sodium: 139 mmol/L (ref 134–144)
eGFR: 57 mL/min/{1.73_m2} — ABNORMAL LOW (ref >59)

## 2022-08-19 DIAGNOSIS — M5416 Radiculopathy, lumbar region: Secondary | ICD-10-CM | POA: Diagnosis not present

## 2022-08-23 ENCOUNTER — Ambulatory Visit (INDEPENDENT_AMBULATORY_CARE_PROVIDER_SITE_OTHER): Payer: Medicare Other

## 2022-08-23 VITALS — BP 130/80 | HR 85 | Temp 98.3°F | Ht 60.0 in | Wt 182.0 lb

## 2022-08-23 DIAGNOSIS — Z23 Encounter for immunization: Secondary | ICD-10-CM

## 2022-08-23 NOTE — Progress Notes (Signed)
Patient presents today for a flu shot. YL,RMA 

## 2022-09-06 DIAGNOSIS — M47816 Spondylosis without myelopathy or radiculopathy, lumbar region: Secondary | ICD-10-CM | POA: Diagnosis not present

## 2022-09-06 DIAGNOSIS — M5136 Other intervertebral disc degeneration, lumbar region: Secondary | ICD-10-CM | POA: Diagnosis not present

## 2022-09-06 DIAGNOSIS — M5416 Radiculopathy, lumbar region: Secondary | ICD-10-CM | POA: Diagnosis not present

## 2022-09-06 DIAGNOSIS — M48061 Spinal stenosis, lumbar region without neurogenic claudication: Secondary | ICD-10-CM | POA: Diagnosis not present

## 2022-09-08 ENCOUNTER — Ambulatory Visit (INDEPENDENT_AMBULATORY_CARE_PROVIDER_SITE_OTHER): Payer: Medicare Other

## 2022-09-08 VITALS — BP 126/64 | HR 86 | Temp 98.1°F | Ht 60.0 in | Wt 184.2 lb

## 2022-09-08 DIAGNOSIS — Z0000 Encounter for general adult medical examination without abnormal findings: Secondary | ICD-10-CM | POA: Diagnosis not present

## 2022-09-08 NOTE — Patient Instructions (Signed)
[name] , Thank you for taking time to come for your Medicare Wellness Visit. I appreciate your ongoing commitment to your health goals. Please review the following plan we discussed and let me know if I can assist you in the future.   Screening recommendations/referrals: Colonoscopy: not required Mammogram: completed 05/06/2022, due 05/08/2023 Bone Density: completed 07/24/2019 Recommended yearly ophthalmology/optometry visit for glaucoma screening and checkup Recommended yearly dental visit for hygiene and checkup  Vaccinations: Influenza vaccine: completed 08/23/2022 Pneumococcal vaccine: completed 12/21/2021 Tdap vaccine: completed 06/27/2019, due 06/26/2029 Shingles vaccine: completed   Covid-19: 10/12/2021, 04/02/2021, 02/09/2020, 01/19/2020  Advanced directives:  Advance directive discussed with you today. Even though you declined this today please call our office should you change your mind and we can give you the proper paperwork for you to fill out.  Conditions/risks identified: none  Next appointment: Follow up in one year for your annual wellness visit    Preventive Care 65 Years and Older, Female Preventive care refers to lifestyle choices and visits with your health care provider that can promote health and wellness. What does preventive care include? A yearly physical exam. This is also called an annual well check. Dental exams once or twice a year. Routine eye exams. Ask your health care provider how often you should have your eyes checked. Personal lifestyle choices, including: Daily care of your teeth and gums. Regular physical activity. Eating a healthy diet. Avoiding tobacco and drug use. Limiting alcohol use. Practicing safe sex. Taking low-dose aspirin every day. Taking vitamin and mineral supplements as recommended by your health care provider. What happens during an annual well check? The services and screenings done by your health care provider during your  annual well check will depend on your age, overall health, lifestyle risk factors, and family history of disease. Counseling  Your health care provider may ask you questions about your: Alcohol use. Tobacco use. Drug use. Emotional well-being. Home and relationship well-being. Sexual activity. Eating habits. History of falls. Memory and ability to understand (cognition). Work and work Statistician. Reproductive health. Screening  You may have the following tests or measurements: Height, weight, and BMI. Blood pressure. Lipid and cholesterol levels. These may be checked every 5 years, or more frequently if you are over 62 years old. Skin check. Lung cancer screening. You may have this screening every year starting at age 1 if you have a 30-pack-year history of smoking and currently smoke or have quit within the past 15 years. Fecal occult blood test (FOBT) of the stool. You may have this test every year starting at age 76. Flexible sigmoidoscopy or colonoscopy. You may have a sigmoidoscopy every 5 years or a colonoscopy every 10 years starting at age 16. Hepatitis C blood test. Hepatitis B blood test. Sexually transmitted disease (STD) testing. Diabetes screening. This is done by checking your blood sugar (glucose) after you have not eaten for a while (fasting). You may have this done every 1-3 years. Bone density scan. This is done to screen for osteoporosis. You may have this done starting at age 81. Mammogram. This may be done every 1-2 years. Talk to your health care provider about how often you should have regular mammograms. Talk with your health care provider about your test results, treatment options, and if necessary, the need for more tests. Vaccines  Your health care provider may recommend certain vaccines, such as: Influenza vaccine. This is recommended every year. Tetanus, diphtheria, and acellular pertussis (Tdap, Td) vaccine. You may need a Td  booster every 10  years. Zoster vaccine. You may need this after age 62. Pneumococcal 13-valent conjugate (PCV13) vaccine. One dose is recommended after age 69. Pneumococcal polysaccharide (PPSV23) vaccine. One dose is recommended after age 18. Talk to your health care provider about which screenings and vaccines you need and how often you need them. This information is not intended to replace advice given to you by your health care provider. Make sure you discuss any questions you have with your health care provider. Document Released: 12/26/2015 Document Revised: 08/18/2016 Document Reviewed: 09/30/2015 Elsevier Interactive Patient Education  2017 [employer] Prevention in the Home Falls can cause injuries. They can happen to people of all ages. There are many things you can do to make your home safe and to help prevent falls. What can I do on the outside of my home? Regularly fix the edges of walkways and driveways and fix any cracks. Remove anything that might make you trip as you walk through a door, such as a raised step or threshold. Trim any bushes or trees on the path to your home. Use bright outdoor lighting. Clear any walking paths of anything that might make someone trip, such as rocks or tools. Regularly check to see if handrails are loose or broken. Make sure that both sides of any steps have handrails. Any raised decks and porches should have guardrails on the edges. Have any leaves, snow, or ice cleared regularly. Use sand or salt on walking paths during winter. Clean up any spills in your garage right away. This includes oil or grease spills. What can I do in the bathroom? Use night lights. Install grab bars by the toilet and in the tub and shower. Do not use towel bars as grab bars. Use non-skid mats or decals in the tub or shower. If you need to sit down in the shower, use a plastic, non-slip stool. Keep the floor dry. Clean up any water that spills on the floor as soon as it  happens. Remove soap buildup in the tub or shower regularly. Attach bath mats securely with double-sided non-slip rug tape. Do not have throw rugs and other things on the floor that can make you trip. What can I do in the bedroom? Use night lights. Make sure that you have a light by your bed that is easy to reach. Do not use any sheets or blankets that are too big for your bed. They should not hang down onto the floor. Have a firm chair that has side arms. You can use this for support while you get dressed. Do not have throw rugs and other things on the floor that can make you trip. What can I do in the kitchen? Clean up any spills right away. Avoid walking on wet floors. Keep items that you use a lot in easy-to-reach places. If you need to reach something above you, use a strong step stool that has a grab bar. Keep electrical cords out of the way. Do not use floor polish or wax that makes floors slippery. If you must use wax, use non-skid floor wax. Do not have throw rugs and other things on the floor that can make you trip. What can I do with my stairs? Do not leave any items on the stairs. Make sure that there are handrails on both sides of the stairs and use them. Fix handrails that are broken or loose. Make sure that handrails are as long as the stairways. Check any carpeting  to make sure that it is firmly attached to the stairs. Fix any carpet that is loose or worn. Avoid having throw rugs at the top or bottom of the stairs. If you do have throw rugs, attach them to the floor with carpet tape. Make sure that you have a light switch at the top of the stairs and the bottom of the stairs. If you do not have them, ask someone to add them for you. What else can I do to help prevent falls? Wear shoes that: Do not have high heels. Have rubber bottoms. Are comfortable and fit you well. Are closed at the toe. Do not wear sandals. If you use a stepladder: Make sure that it is fully opened.  Do not climb a closed stepladder. Make sure that both sides of the stepladder are locked into place. Ask someone to hold it for you, if possible. Clearly mark and make sure that you can see: Any grab bars or handrails. First and last steps. Where the edge of each step is. Use tools that help you move around (mobility aids) if they are needed. These include: Canes. Walkers. Scooters. Crutches. Turn on the lights when you go into a dark area. Replace any light bulbs as soon as they burn out. Set up your furniture so you have a clear path. Avoid moving your furniture around. If any of your floors are uneven, fix them. If there are any pets around you, be aware of where they are. Review your medicines with your doctor. Some medicines can make you feel dizzy. This can increase your chance of falling. Ask your doctor what other things that you can do to help prevent falls. This information is not intended to replace advice given to you by your health care provider. Make sure you discuss any questions you have with your health care provider. Document Released: 09/25/2009 Document Revised: 05/06/2016 Document Reviewed: 01/03/2015 Elsevier Interactive Patient Education  2017 [employer].

## 2022-09-08 NOTE — Progress Notes (Signed)
Subjective:   Tricia Ferguson is a 88 y.o. female who presents for Medicare Annual (Subsequent) preventive examination.  Review of Systems     Cardiac Risk Factors include: advanced age (>72men, >67 women);hypertension;obesity (BMI >30kg/m2)     Objective:    Today's Vitals   09/08/22 0908 09/08/22 0913  BP: 126/64   Pulse: 86   Temp: 98.1 F (36.7 C)   TempSrc: Oral   SpO2: 91%   Weight: 184 lb 3.2 oz (83.6 kg)   Height: 5' (1.524 m)   PainSc:  6    Body mass index is 35.97 kg/m.     09/08/2022    9:17 AM 01/11/2022    9:13 AM 08/13/2021   11:34 AM 07/16/2020    9:42 AM 06/27/2019    9:31 AM 09/29/2017    3:00 PM  Advanced Directives  Does Patient Have a Medical Advance Directive? No No No No No No  Would patient like information on creating a medical advance directive? No - Patient declined  No - Patient declined No - Patient declined Yes (MAU/Ambulatory/Procedural Areas - Information given)     Current Medications (verified) Outpatient Encounter Medications as of 09/08/2022  Medication Sig   Acetaminophen (TYLENOL PO) Take by mouth.   Ascorbic Acid (VITAMIN C PO) Take by mouth.   calcium carbonate (TUMS - DOSED IN MG ELEMENTAL CALCIUM) 500 MG chewable tablet Chew 2 tablets by mouth daily.    Cholecalciferol (VITAMIN D3 PO) 1 cap PO QD   cyclobenzaprine (FLEXERIL) 10 MG tablet Take 1 tablet (10 mg total) by mouth 3 (three) times daily as needed for muscle spasms.   Garlic 0160 MG CAPS Take by mouth.   glucosamine-chondroitin 500-400 MG tablet Take 1 tablet by mouth 3 (three) times daily.   levocetirizine (XYZAL) 5 MG tablet TAKE 1 TABLET(5 MG) BY MOUTH EVERY EVENING   lisinopril (ZESTRIL) 5 MG tablet TAKE 1 TABLET(5 MG) BY MOUTH DAILY   Multiple Vitamin (MULTIVITAMIN) tablet Take 1 tablet by mouth daily.   Multiple Vitamins-Minerals (PRESERVISION AREDS PO) Take by mouth.   Omega-3 Fatty Acids (FISH OIL) 1000 MG CAPS Take 1 tablet by mouth daily.    pantoprazole (PROTONIX) 40 MG tablet TAKE 1 TABLET(40 MG) BY MOUTH DAILY 30 MINUTES BEFORE BREAKFAST   traMADol (ULTRAM) 50 MG tablet Take 1 tablet (50 mg total) by mouth every 6 (six) hours as needed.   No facility-administered encounter medications on file as of 09/08/2022.    Allergies (verified) Amoxicillin, Penicillins, and Potassium-containing compounds   History: Past Medical History:  Diagnosis Date   Chronic kidney disease    Hypertension    Peripheral vascular disease ([place])    Vitamin D deficiency disease    Past Surgical History:  Procedure Laterality Date   ABDOMINAL HYSTERECTOMY     ESOPHAGOGASTRODUODENOSCOPY (EGD) WITH PROPOFOL N/A 01/11/2022   Procedure: ESOPHAGOGASTRODUODENOSCOPY (EGD) WITH PROPOFOL;  Surgeon: Jonathon Bellows, MD;  Location: [facility] ENDOSCOPY;  Service: Gastroenterology;  Laterality: N/A;   HEMORRHOID SURGERY     Family History  Problem Relation Age of Onset   Hypertension Mother    Throat cancer Mother    Prostate cancer Father    Tremor Father    Hypertension Sister    Diabetes Sister    Hypertension Brother    Hypertension Sister    Diabetes Sister    Hypertension Sister    Social History   Socioeconomic History   Marital status: Widowed    Spouse name: Not on  file   Number of children: 3   Years of education: Not on file   Highest education level: Not on file  Occupational History   Occupation: retired  Tobacco Use   Smoking status: Former    Packs/day: 1.00    Years: 20.00    Total pack years: 20.00    Types: Cigarettes    Quit date: 2004    Years since quitting: 19.7   Smokeless tobacco: Never   Tobacco comments:    n/a  Vaping Use   Vaping Use: Never used  Substance and Sexual Activity   Alcohol use: No   Drug use: No   Sexual activity: Not Currently  Other Topics Concern   Not on file  Social History Narrative   Not on file   Social Determinants of Health   Financial Resource Strain: Low Risk  (09/08/2022)    Overall Financial Resource Strain (CARDIA)    Difficulty of Paying Living Expenses: Not hard at all  Food Insecurity: No Food Insecurity (09/08/2022)   Hunger Vital Sign    Worried About Running Out of Food in the Last Year: Never true    [place] in the Last Year: Never true  Transportation Needs: No Transportation Needs (09/08/2022)   PRAPARE - Hydrologist (Medical): No    Lack of Transportation (Non-Medical): No  Physical Activity: Sufficiently Active (09/08/2022)   Exercise Vital Sign    Days of Exercise per Week: 7 days    Minutes of Exercise per Session: 30 min  Stress: No Stress Concern Present (09/08/2022)   [place]    Feeling of Stress : Not at all  Social Connections: Not on file    Tobacco Counseling Counseling given: Not Answered Tobacco comments: n/a   Clinical Intake:  Pre-visit preparation completed: Yes  Pain : 0-10 Pain Score: 6  Pain Type: Chronic pain Pain Location: Back Pain Orientation: Lower Pain Descriptors / Indicators: Aching Pain Onset: More than a month ago Pain Frequency: Constant     Nutritional Status: BMI > 30  Obese Nutritional Risks: None Diabetes: No  How often do you need to have someone help you when you read instructions, pamphlets, or other written materials from your doctor or pharmacy?: 1 - Never What is the last grade level you completed in school?: 12th grade  Diabetic? no  Interpreter Needed?: No  Information entered by :: NAllen LPN   Activities of Daily Living    09/08/2022    9:18 AM  In your present state of health, do you have any difficulty performing the following activities:  Hearing? 0  Vision? 0  Difficulty concentrating or making decisions? 1  Walking or climbing stairs? 1  Dressing or bathing? 0  Doing errands, shopping? 0  Preparing Food and eating ? N  Using the Toilet? N  In the past six months,  have you accidently leaked urine? Y  Do you have problems with loss of bowel control? N  Managing your Medications? N  Managing your Finances? N  Housekeeping or managing your Housekeeping? N    Patient Care Team: Glendale Chard, MD as PCP - General (Internal Medicine) Marygrace Drought, MD as Consulting Physician (Ophthalmology)  Indicate any recent Medical Services you may have received from other than [facility] providers in the past year (date may be approximate).     Assessment:   This is a routine wellness examination for [place].  Hearing/Vision screen Vision Screening - Comments:: Regular eye exams, Dr. Satira Sark, Dr. Posey Pronto  Dietary issues and exercise activities discussed: Current Exercise Habits: Home exercise routine, Type of exercise: stretching, Time (Minutes): 30, Frequency (Times/Week): 7, Weekly Exercise (Minutes/Week): 210   Goals Addressed             This Visit's Progress    Patient Stated       09/08/2022, no goals       Depression Screen    09/08/2022    9:18 AM 08/17/2022   10:14 AM 08/13/2021   11:35 AM 08/10/2021   11:09 AM 07/16/2020    9:43 AM 07/15/2020    3:32 PM 02/13/2020    8:56 AM  PHQ 2/9 Scores  PHQ - 2 Score 0 0 0 0 0 0 1  PHQ- 9 Score     1      Fall Risk    09/08/2022    9:18 AM 08/17/2022   10:14 AM 08/13/2021   11:34 AM 08/10/2021   11:09 AM 07/16/2020    9:43 AM  Fall Risk   Falls in the past year? 0 0 1 1 0  Comment   slipped in the shower    Number falls in past yr: 0 0 0 0   Comment    slipped getting out the bath tub   Injury with Fall? 0 0 0 0   Risk for fall due to : Impaired balance/gait;Impaired mobility;Medication side effect History of fall(s) Impaired mobility;Impaired balance/gait;Medication side effect Impaired balance/gait Impaired balance/gait;Medication side effect  Follow up Falls prevention discussed;Falls evaluation completed;Education provided Falls evaluation completed Falls evaluation completed;Education provided;Falls  prevention discussed  Falls evaluation completed;Education provided;Falls prevention discussed    FALL RISK PREVENTION PERTAINING TO THE HOME:  Any stairs in or around the home? No  If so, are there any without handrails?  N/a Home free of loose throw rugs in walkways, pet beds, electrical cords, etc? Yes  Adequate lighting in your home to reduce risk of falls? Yes   ASSISTIVE DEVICES UTILIZED TO PREVENT FALLS:  Life alert? No  Use of a cane, walker or w/c? Yes  Grab bars in the bathroom? Yes  Shower chair or bench in shower? No  Elevated toilet seat or a handicapped toilet? Yes   TIMED UP AND GO:  Was the test performed? Yes .  Length of time to ambulate 10 feet: 6 sec.   Gait slow and steady with assistive device  Cognitive Function:        09/08/2022    9:19 AM 08/13/2021   11:37 AM 07/16/2020    9:46 AM 06/27/2019    9:39 AM  6CIT Screen  What Year? 0 points 0 points 0 points 0 points  What month? 0 points 0 points 0 points 0 points  What time? 0 points 0 points 0 points 0 points  Count back from 20 0 points 0 points 0 points 0 points  Months in reverse 0 points 0 points 0 points 0 points  Repeat phrase 0 points 0 points 0 points 0 points  Total Score 0 points 0 points 0 points 0 points    Immunizations Immunization History  Administered Date(s) Administered   Fluad Quad(high Dose 65+) 08/23/2022   Influenza, High Dose Seasonal PF 09/14/2014, 09/09/2015, 09/13/2016, 09/14/2017   Influenza-Unspecified 08/28/2018, 09/16/2021   PFIZER(Purple Top)SARS-COV-2 Vaccination 01/19/2020, 02/09/2020, 10/06/2020, 04/02/2021   Pfizer Covid-19 Vaccine Bivalent Booster 5y-11y 10/12/2021   Pneumococcal Conjugate-13 05/16/2015  Pneumococcal Polysaccharide-23 12/21/2021   Tdap 06/27/2019   Zoster Recombinat (Shingrix) 07/16/2020, 09/13/2020    TDAP status: Up to date  Flu Vaccine status: Up to date  Pneumococcal vaccine status: Up to date  Covid-19 vaccine status:  Completed vaccines  Qualifies for Shingles Vaccine? Yes   Zostavax completed Yes   Shingrix Completed?: Yes  Screening Tests Health Maintenance  Topic Date Due   COVID-19 Vaccine (6 - Pfizer series) 02/09/2022   TETANUS/TDAP  06/26/2029   Pneumonia Vaccine 50+ Years old  Completed   INFLUENZA VACCINE  Completed   DEXA SCAN  Completed   Zoster Vaccines- Shingrix  Completed   HPV VACCINES  Aged Out    Health Maintenance  Health Maintenance Due  Topic Date Due   COVID-19 Vaccine (6 - Pfizer series) 02/09/2022    Colorectal cancer screening: No longer required.   Mammogram status: Completed 05/06/2022. Repeat every year  Bone Density status: Completed 07/24/2019.   Lung Cancer Screening: (Low Dose CT Chest recommended if Age 69-80 years, 30 pack-year currently smoking OR have quit w/in 15years.) does not qualify.   Lung Cancer Screening Referral: no  Additional Screening:  Hepatitis C Screening: does not qualify;   Vision Screening: Recommended annual ophthalmology exams for early detection of glaucoma and other disorders of the eye. Is the patient up to date with their annual eye exam?  Yes  Who is the provider or what is the name of the office in which the patient attends annual eye exams? Dr. Posey Pronto and Dr. Satira Sark If pt is not established with a provider, would they like to be referred to a provider to establish care? No .   Dental Screening: Recommended annual dental exams for proper oral hygiene  Community Resource Referral / Chronic Care Management: CRR required this visit?  No   CCM required this visit?  No      Plan:     I have personally reviewed and noted the following in the patient's chart:   Medical and social history Use of alcohol, tobacco or illicit drugs  Current medications and supplements including opioid prescriptions. Patient is not currently taking opioid prescriptions. Functional ability and status Nutritional status Physical  activity Advanced directives List of other physicians Hospitalizations, surgeries, and ER visits in previous 12 months Vitals Screenings to include cognitive, depression, and falls Referrals and appointments  In addition, I have reviewed and discussed with patient certain preventive protocols, quality metrics, and best practice recommendations. A written personalized care plan for preventive services as well as general preventive health recommendations were provided to patient.     Kellie Simmering, LPN   [id number]   Nurse Notes: none

## 2022-09-13 DIAGNOSIS — R2689 Other abnormalities of gait and mobility: Secondary | ICD-10-CM | POA: Diagnosis not present

## 2022-09-16 ENCOUNTER — Encounter: Payer: Self-pay | Admitting: Internal Medicine

## 2022-10-14 ENCOUNTER — Other Ambulatory Visit: Payer: Self-pay | Admitting: Internal Medicine

## 2022-11-09 DIAGNOSIS — H353221 Exudative age-related macular degeneration, left eye, with active choroidal neovascularization: Secondary | ICD-10-CM | POA: Diagnosis not present

## 2022-11-11 DIAGNOSIS — M47816 Spondylosis without myelopathy or radiculopathy, lumbar region: Secondary | ICD-10-CM | POA: Diagnosis not present

## 2022-11-11 DIAGNOSIS — M25561 Pain in right knee: Secondary | ICD-10-CM | POA: Diagnosis not present

## 2022-11-11 DIAGNOSIS — Z6836 Body mass index (BMI) 36.0-36.9, adult: Secondary | ICD-10-CM | POA: Diagnosis not present

## 2022-11-25 DIAGNOSIS — M47816 Spondylosis without myelopathy or radiculopathy, lumbar region: Secondary | ICD-10-CM | POA: Diagnosis not present

## 2022-12-15 DIAGNOSIS — M47816 Spondylosis without myelopathy or radiculopathy, lumbar region: Secondary | ICD-10-CM | POA: Diagnosis not present

## 2023-01-12 ENCOUNTER — Other Ambulatory Visit: Payer: Self-pay | Admitting: Internal Medicine

## 2023-01-26 IMAGING — CT CT ABD-PELV W/ CM
2 of 5 series · 14 of 46 positions shown, 16 images · IV contrast (omnipaque)
Comparison: MRI 04/05/2016

CLINICAL DATA: Hiatal hernia.  Reflux.

EXAM:
CT ABDOMEN AND PELVIS WITH CONTRAST
TECHNIQUE: Multidetector CT imaging of the abdomen and pelvis was performed
using the standard protocol following bolus administration of
intravenous contrast.
CONTRAST:  80mL OMNIPAQUE IOHEXOL 350 MG/ML SOLN

[Series 2: abd pelvis 5.00 · axial · 0.73mm/px · z∈[-1535,-1205]mm · 11 of 80 slices shown, 13 images]
[im 7/80  soft-tissue]
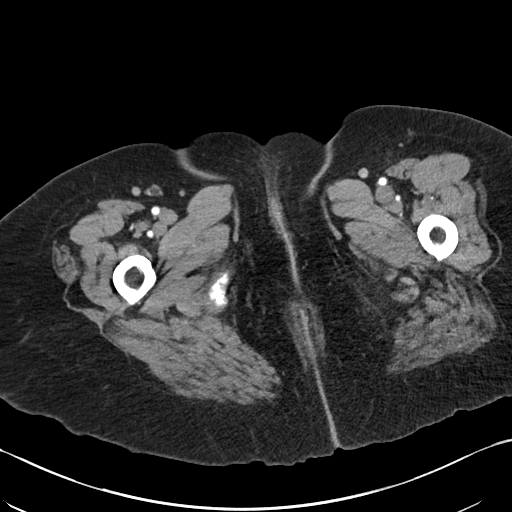
[im 7/80  bone]
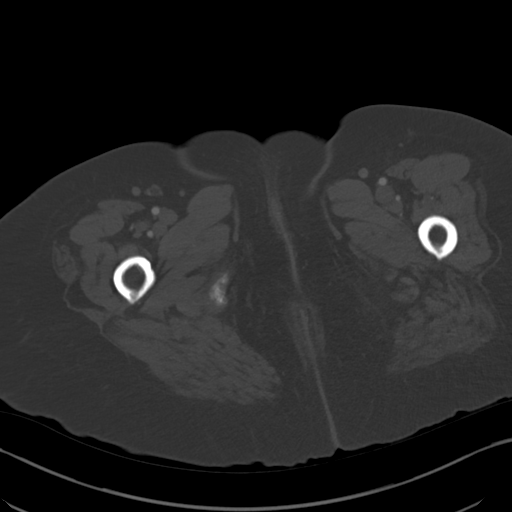
[im 14/80  soft-tissue]
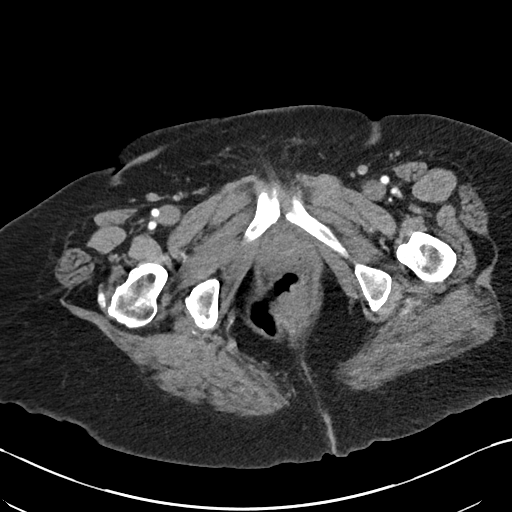
[im 20/80  soft-tissue]
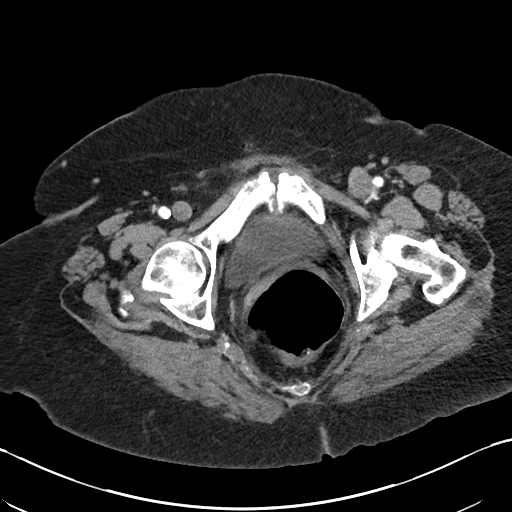
[im 27/80  soft-tissue]
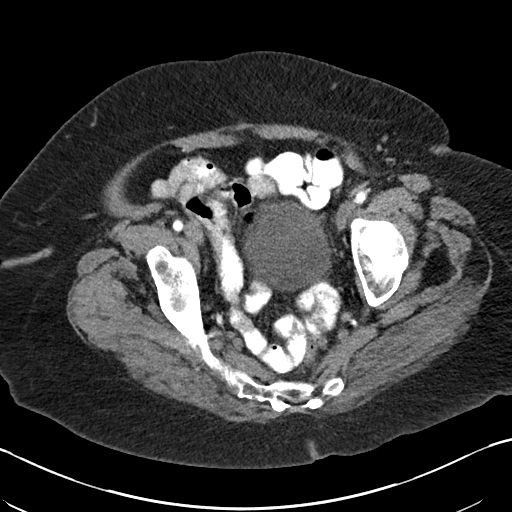
[im 33/80  soft-tissue]
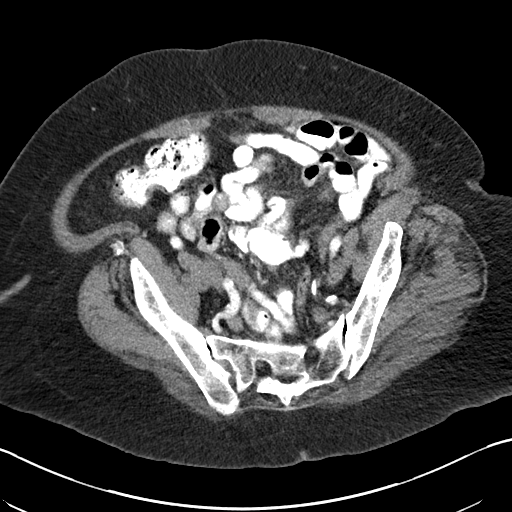
[im 40/80  soft-tissue]
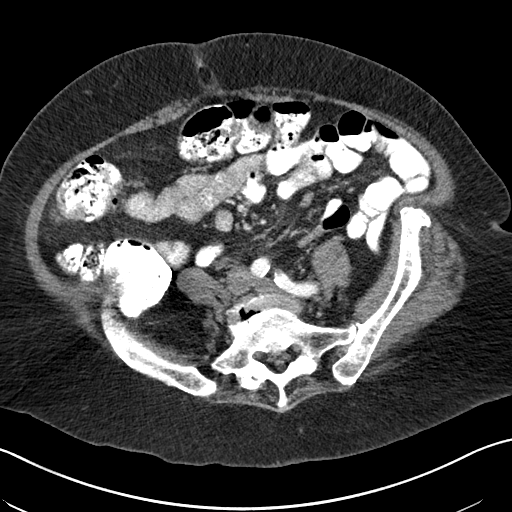
[im 47/80  soft-tissue]
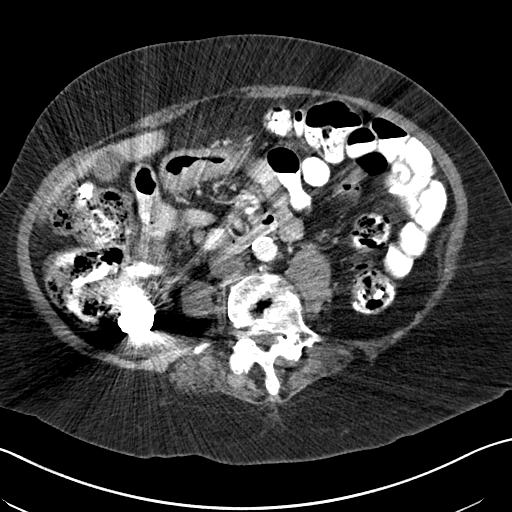
[im 53/80  soft-tissue]
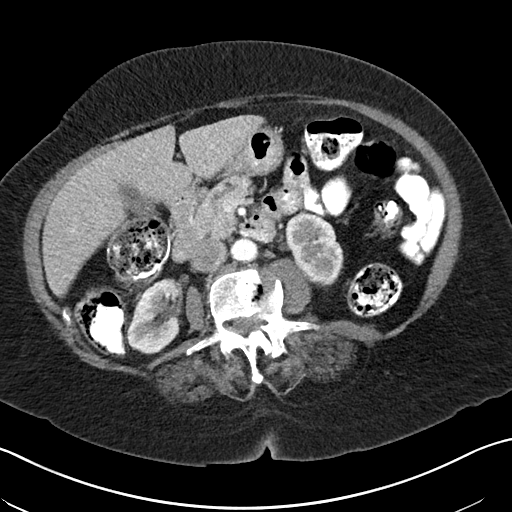
[im 60/80  soft-tissue]
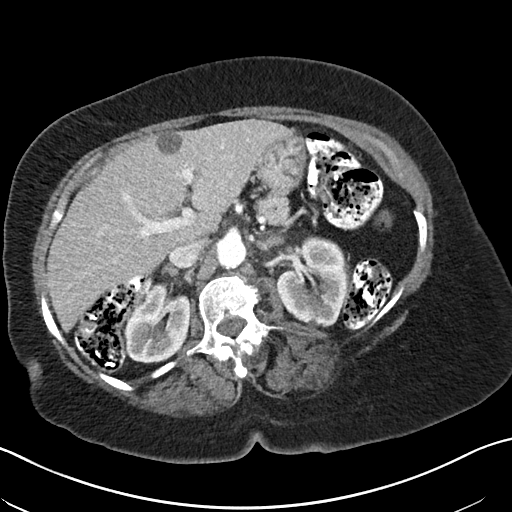
[im 60/80  bone]
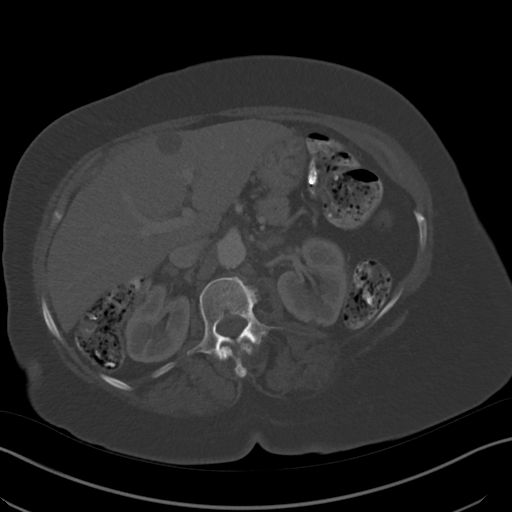
[im 66/80  soft-tissue]
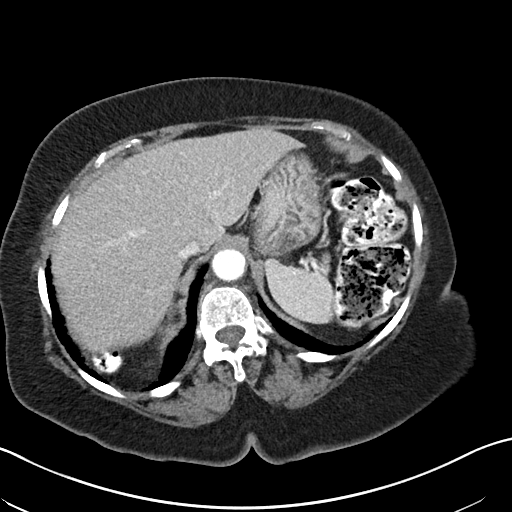
[im 73/80  soft-tissue]
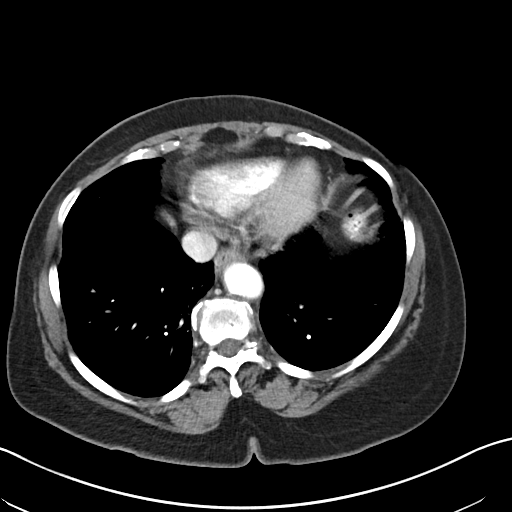

[Series 4: coronals abd pelvis 2.00 cor · coronal · 0.73mm/px · 3 of 156 slices shown]
[im 52/156  soft-tissue]
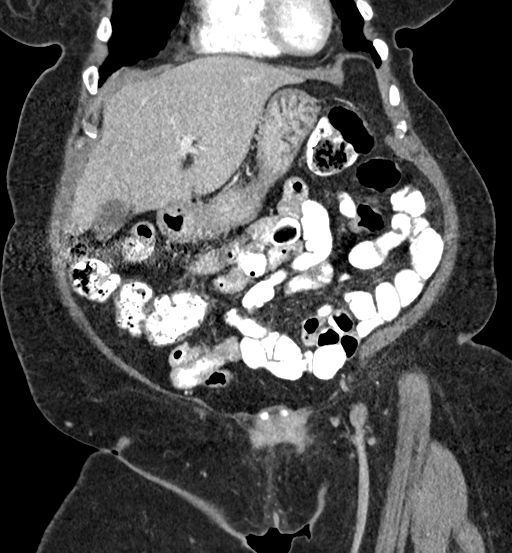
[im 69/156  soft-tissue]
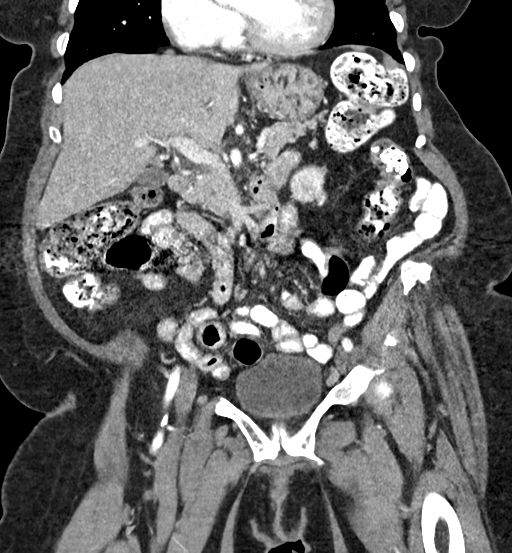
[im 87/156  soft-tissue]
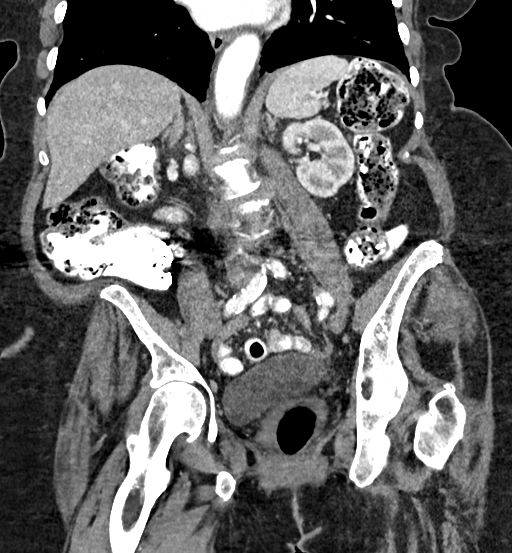

[14 of 46 positions shown; findings below may reference images not displayed]

FINDINGS: Lower chest: Scattered peripheral reticular densities at the lung
bases are suggestive for chronic changes. No pleural effusions.

Hepatobiliary: Again noted are small hepatic cysts. Normal
appearance of the gallbladder. Tiny hyperdense or enhancement focus
in the liver on sequence 2 image 12 is likely an incidental finding.
No biliary dilatation.

Pancreas: Unremarkable. No pancreatic ductal dilatation or
surrounding inflammatory changes.

Spleen: Normal in size without focal abnormality.

Adrenals/Urinary Tract: Normal adrenal glands. Small cysts along the
posterior left kidney. Negative for hydronephrosis. No suspicious
renal lesion. Normal appearance of the urinary bladder.

Stomach/Bowel: There is a large very dense structure in the right
colon that causes streak artifact. This structure measures 3.4 x
x 3.4 cm and appears to represent a large calcification or stone.
Oral contrast in the small and large bowel. No evidence for an
obstructive process. Normal appearance of the stomach without a
hiatal hernia. Rectum is distended with gas.

Vascular/Lymphatic: Aortic atherosclerosis. No enlarged abdominal or
pelvic lymph nodes.

Reproductive: Status post hysterectomy. No adnexal masses.

Other: Negative for free fluid. Negative for free air. Umbilical
hernia containing fat.

Musculoskeletal: Severe degenerative facet arthropathy in the lumbar
spine. Anterolisthesis of L5 on S1. Mild anterolisthesis of L4 on
L5. Multilevel disc space disease. Curvature in the lumbar spine.
IMPRESSION: 1. No acute abnormality in the abdomen or pelvis.
2. No significant hiatal hernia.
3. Large hyperdense structure in the cecum measuring up to 3.4 cm.
This is suggestive for a large fecalith. Structure is very dense and
likely contains or coated with barium from recent esophagram.
4. Small hepatic and renal cysts.

## 2023-02-01 DIAGNOSIS — M47816 Spondylosis without myelopathy or radiculopathy, lumbar region: Secondary | ICD-10-CM | POA: Diagnosis not present

## 2023-02-03 DIAGNOSIS — H353221 Exudative age-related macular degeneration, left eye, with active choroidal neovascularization: Secondary | ICD-10-CM | POA: Diagnosis not present

## 2023-02-15 ENCOUNTER — Encounter: Payer: Self-pay | Admitting: Internal Medicine

## 2023-02-15 ENCOUNTER — Ambulatory Visit (INDEPENDENT_AMBULATORY_CARE_PROVIDER_SITE_OTHER): Payer: Medicare Other | Admitting: Internal Medicine

## 2023-02-15 VITALS — BP 110/74 | HR 73 | Temp 98.2°F | Ht 60.0 in | Wt 183.8 lb

## 2023-02-15 DIAGNOSIS — N1831 Chronic kidney disease, stage 3a: Secondary | ICD-10-CM

## 2023-02-15 DIAGNOSIS — I1310 Hypertensive heart and chronic kidney disease without heart failure, with stage 1 through stage 4 chronic kidney disease, or unspecified chronic kidney disease: Secondary | ICD-10-CM

## 2023-02-15 DIAGNOSIS — I700 Atherosclerosis of aorta: Secondary | ICD-10-CM

## 2023-02-15 DIAGNOSIS — E2839 Other primary ovarian failure: Secondary | ICD-10-CM

## 2023-02-15 DIAGNOSIS — I129 Hypertensive chronic kidney disease with stage 1 through stage 4 chronic kidney disease, or unspecified chronic kidney disease: Secondary | ICD-10-CM | POA: Diagnosis not present

## 2023-02-15 DIAGNOSIS — R1012 Left upper quadrant pain: Secondary | ICD-10-CM

## 2023-02-15 DIAGNOSIS — R7309 Other abnormal glucose: Secondary | ICD-10-CM | POA: Diagnosis not present

## 2023-02-15 DIAGNOSIS — R269 Unspecified abnormalities of gait and mobility: Secondary | ICD-10-CM

## 2023-02-15 DIAGNOSIS — Z6835 Body mass index (BMI) 35.0-35.9, adult: Secondary | ICD-10-CM

## 2023-02-15 LAB — CBC
Hematocrit: 35.3 % (ref 34.0–46.6)
Hemoglobin: 11.1 g/dL (ref 11.1–15.9)
MCH: 30.3 pg (ref 26.6–33.0)
MCHC: 31.4 g/dL — ABNORMAL LOW (ref 31.5–35.7)
MCV: 96 fL (ref 79–97)
Platelets: 238 10*3/uL (ref 150–450)
RBC: 3.66 x10E6/uL — ABNORMAL LOW (ref 3.77–5.28)
RDW: 12.7 % (ref 11.7–15.4)
WBC: 9.9 10*3/uL (ref 3.4–10.8)

## 2023-02-15 LAB — CMP14+EGFR
ALT: 18 IU/L (ref 0–32)
AST: 20 IU/L (ref 0–40)
Albumin/Globulin Ratio: 1.5 (ref 1.2–2.2)
Albumin: 4 g/dL (ref 3.7–4.7)
Alkaline Phosphatase: 98 IU/L (ref 44–121)
BUN/Creatinine Ratio: 27 (ref 12–28)
BUN: 25 mg/dL (ref 8–27)
Bilirubin Total: 0.3 mg/dL (ref 0.0–1.2)
CO2: 20 mmol/L (ref 20–29)
Calcium: 9.1 mg/dL (ref 8.7–10.3)
Chloride: 105 mmol/L (ref 96–106)
Creatinine, Ser: 0.94 mg/dL (ref 0.57–1.00)
Globulin, Total: 2.7 g/dL (ref 1.5–4.5)
Glucose: 64 mg/dL — ABNORMAL LOW (ref 70–99)
Potassium: 4.4 mmol/L (ref 3.5–5.2)
Sodium: 139 mmol/L (ref 134–144)
Total Protein: 6.7 g/dL (ref 6.0–8.5)
eGFR: 59 mL/min/{1.73_m2} — ABNORMAL LOW (ref >59)

## 2023-02-15 LAB — LIPID PANEL
Chol/HDL Ratio: 2 ratio (ref 0.0–4.4)
Cholesterol, Total: 145 mg/dL (ref 100–199)
HDL: 74 mg/dL (ref >39)
LDL Chol Calc (NIH): 58 mg/dL (ref 0–99)
Triglycerides: 63 mg/dL (ref 0–149)
VLDL Cholesterol Cal: 13 mg/dL (ref 5–40)

## 2023-02-15 LAB — POCT URINALYSIS DIPSTICK
Bilirubin, UA: NEGATIVE
Blood, UA: NEGATIVE
Glucose, UA: NEGATIVE
Ketones, UA: NEGATIVE
Leukocytes, UA: NEGATIVE
Nitrite, UA: NEGATIVE
Protein, UA: NEGATIVE
Spec Grav, UA: 1.02 (ref 1.010–1.025)
Urobilinogen, UA: 0.2 E.U./dL
pH, UA: 5.5 (ref 5.0–8.0)

## 2023-02-15 NOTE — Patient Instructions (Signed)
Hypertension, Adult Hypertension is another name for high blood pressure. High blood pressure forces your heart to work harder to pump blood. This can cause problems over time. There are two numbers in a blood pressure reading. There is a top number (systolic) over a bottom number (diastolic). It is best to have a blood pressure that is below 120/80. What are the causes? The cause of this condition is not known. Some other conditions can lead to high blood pressure. What increases the risk? Some lifestyle factors can make you more likely to develop high blood pressure: Smoking. Not getting enough exercise or physical activity. Being overweight. Having too much fat, sugar, calories, or salt (sodium) in your diet. Drinking too much alcohol. Other risk factors include: Having any of these conditions: Heart disease. Diabetes. High cholesterol. Kidney disease. Obstructive sleep apnea. Having a family history of high blood pressure and high cholesterol. Age. The risk increases with age. Stress. What are the signs or symptoms? High blood pressure may not cause symptoms. Very high blood pressure (hypertensive crisis) may cause: Headache. Fast or uneven heartbeats (palpitations). Shortness of breath. Nosebleed. Vomiting or feeling like you may vomit (nauseous). Changes in how you see. Very bad chest pain. Feeling dizzy. Seizures. How is this treated? This condition is treated by making healthy lifestyle changes, such as: Eating healthy foods. Exercising more. Drinking less alcohol. Your doctor may prescribe medicine if lifestyle changes do not help enough and if: Your top number is above 130. Your bottom number is above 80. Your personal target blood pressure may vary. Follow these instructions at home: Eating and drinking  If told, follow the DASH eating plan. To follow this plan: Fill one half of your plate at each meal with fruits and vegetables. Fill one fourth of your plate  at each meal with whole grains. Whole grains include whole-wheat pasta, brown rice, and whole-grain bread. Eat or drink low-fat dairy products, such as skim milk or low-fat yogurt. Fill one fourth of your plate at each meal with low-fat (lean) proteins. Low-fat proteins include fish, chicken without skin, eggs, beans, and tofu. Avoid fatty meat, cured and processed meat, or chicken with skin. Avoid pre-made or processed food. Limit the amount of salt in your diet to less than 1,500 mg each day. Do not drink alcohol if: Your doctor tells you not to drink. You are pregnant, may be pregnant, or are planning to become pregnant. If you drink alcohol: Limit how much you have to: 0-1 drink a day for women. 0-2 drinks a day for men. Know how much alcohol is in your drink. In the [place] one drink equals one 12 oz bottle of beer (355 mL), one 5 oz glass of wine (148 mL), or one 1 oz glass of hard liquor (44 mL). Lifestyle  Work with your doctor to stay at a healthy weight or to lose weight. Ask your doctor what the best weight is for you. Get at least 30 minutes of exercise that causes your heart to beat faster (aerobic exercise) most days of the week. This may include walking, swimming, or biking. Get at least 30 minutes of exercise that strengthens your muscles (resistance exercise) at least 3 days a week. This may include lifting weights or doing Pilates. Do not smoke or use any products that contain nicotine or tobacco. If you need help quitting, ask your doctor. Check your blood pressure at home as told by your doctor. Keep all follow-up visits. Medicines Take over-the-counter and prescription medicines   only as told by your doctor. Follow directions carefully. Do not skip doses of blood pressure medicine. The medicine does not work as well if you skip doses. Skipping doses also puts you at risk for problems. Ask your doctor about side effects or reactions to medicines that you should watch  for. Contact a doctor if: You think you are having a reaction to the medicine you are taking. You have headaches that keep coming back. You feel dizzy. You have swelling in your ankles. You have trouble with your vision. Get help right away if: You get a very bad headache. You start to feel mixed up (confused). You feel weak or numb. You feel faint. You have very bad pain in your: Chest. Belly (abdomen). You vomit more than once. You have trouble breathing. These symptoms may be an emergency. Get help right away. Call 911. Do not wait to see if the symptoms will go away. Do not drive yourself to the hospital. Summary Hypertension is another name for high blood pressure. High blood pressure forces your heart to work harder to pump blood. For most people, a normal blood pressure is less than 120/80. Making healthy choices can help lower blood pressure. If your blood pressure does not get lower with healthy choices, you may need to take medicine. This information is not intended to replace advice given to you by your health care provider. Make sure you discuss any questions you have with your health care provider. Document Revised: 09/17/2021 Document Reviewed: 09/17/2021 Elsevier Patient Education  2023 [employer]  

## 2023-02-15 NOTE — Progress Notes (Unsigned)
I,Tricia Ferguson,acting as a scribe for Tricia Greenland, MD.,have documented all relevant documentation on the behalf of Tricia Greenland, MD,as directed by  Tricia Greenland, MD while in the presence of Tricia Greenland, MD.    Subjective:     Patient ID: Tricia Ferguson , female    DOB: 1934/10/07 , 89 y.o.   MRN: [id number]   Chief Complaint  Patient presents with   Hypertension    HPI  She is here today for BP check.  She reports compliance with meds. She denies headache, chest pain, SOB.  She reports having pains periodically under her rib on the left side. She does not know if it is gas.    Hypertension This is a chronic problem. The current episode started more than 1 year ago. The problem has been gradually improving since onset. The problem is controlled. Pertinent negatives include no blurred vision, chest pain, palpitations or shortness of breath. Risk factors for coronary artery disease include obesity and post-menopausal state. The current treatment provides moderate improvement.     Past Medical History:  Diagnosis Date   Chronic kidney disease    Hypertension    Peripheral vascular disease ([place])    Vitamin D deficiency disease      Family History  Problem Relation Age of Onset   Hypertension Mother    Throat cancer Mother    Prostate cancer Father    Tremor Father    Hypertension Sister    Diabetes Sister    Hypertension Brother    Hypertension Sister    Diabetes Sister    Hypertension Sister      Current Outpatient Medications:    Acetaminophen (TYLENOL PO), Take by mouth., Disp: , Rfl:    Ascorbic Acid (VITAMIN C PO), Take by mouth., Disp: , Rfl:    calcium carbonate (TUMS - DOSED IN MG ELEMENTAL CALCIUM) 500 MG chewable tablet, Chew 2 tablets by mouth daily. , Disp: , Rfl:    Cholecalciferol (VITAMIN D3 PO), 1 cap PO QD, Disp: , Rfl:    cyclobenzaprine (FLEXERIL) 10 MG tablet, Take 1 tablet (10 mg total) by mouth 3 (three) times daily  as needed for muscle spasms., Disp: 30 tablet, Rfl: 0   Garlic 123XX123 MG CAPS, Take by mouth., Disp: , Rfl:    glucosamine-chondroitin 500-400 MG tablet, Take 1 tablet by mouth 3 (three) times daily., Disp: , Rfl:    levocetirizine (XYZAL) 5 MG tablet, TAKE 1 TABLET(5 MG) BY MOUTH EVERY EVENING, Disp: 90 tablet, Rfl: 1   lisinopril (ZESTRIL) 5 MG tablet, TAKE 1 TABLET(5 MG) BY MOUTH DAILY, Disp: 90 tablet, Rfl: 1   Multiple Vitamin (MULTIVITAMIN) tablet, Take 1 tablet by mouth daily., Disp: , Rfl:    Multiple Vitamins-Minerals (PRESERVISION AREDS PO), Take by mouth., Disp: , Rfl:    Omega-3 Fatty Acids (FISH OIL) 1000 MG CAPS, Take 1 tablet by mouth daily., Disp: , Rfl:    pantoprazole (PROTONIX) 40 MG tablet, TAKE 1 TABLET(40 MG) BY MOUTH DAILY 30 MINUTES BEFORE BREAKFAST, Disp: 30 tablet, Rfl: 11   traMADol (ULTRAM) 50 MG tablet, Take 1 tablet (50 mg total) by mouth every 6 (six) hours as needed. (Patient not taking: Reported on 02/15/2023), Disp: 20 tablet, Rfl: 0   Allergies  Allergen Reactions   Amoxicillin Itching   Penicillins Hives   Potassium-Containing Compounds Itching     Review of Systems  Constitutional: Negative.   Eyes:  Negative for blurred vision.  Respiratory: Negative.  Negative for shortness of breath.   Cardiovascular: Negative.  Negative for chest pain and palpitations.  Neurological: Negative.   Psychiatric/Behavioral: Negative.       Today's Vitals   02/15/23 1036  BP: 110/74  Pulse: 73  Temp: 98.2 F (36.8 C)  SpO2: 98%  Weight: 183 lb 12.8 oz (83.4 kg)  Height: 5' (1.524 m)   Body mass index is 35.9 kg/m.  Wt Readings from Last 3 Encounters:  02/15/23 183 lb 12.8 oz (83.4 kg)  09/08/22 184 lb 3.2 oz (83.6 kg)  08/23/22 182 lb (82.6 kg)    Objective:  Physical Exam      Assessment And Plan:     1. Parenchymal renal hypertension, stage 1 through stage 4 or unspecified chronic kidney disease  2. Abnormal glucose  3. Gait abnormality  4.  Class 2 severe obesity due to excess calories with serious comorbidity and body mass index (BMI) of 35.0 to 35.9 in adult [facility]) She is encouraged to strive for BMI less than 30 to decrease cardiac risk. Advised to aim for at least 150 minutes of exercise per week.    Patient was given opportunity to ask questions. Patient verbalized understanding of the plan and was able to repeat key elements of the plan. All questions were answered to their satisfaction.  Tricia Ferguson, CMA   I, Tricia Ferguson, CMA, have reviewed all documentation for this visit. The documentation on 02/15/23 for the exam, diagnosis, procedures, and orders are all accurate and complete.   IF YOU HAVE BEEN REFERRED TO A SPECIALIST, IT MAY TAKE 1-2 WEEKS TO SCHEDULE/PROCESS THE REFERRAL. IF YOU HAVE NOT HEARD FROM US/SPECIALIST IN TWO WEEKS, PLEASE GIVE [place] A CALL AT [phone].   THE PATIENT IS ENCOURAGED TO PRACTICE SOCIAL DISTANCING DUE TO THE COVID-19 PANDEMIC.

## 2023-02-16 DIAGNOSIS — I1310 Hypertensive heart and chronic kidney disease without heart failure, with stage 1 through stage 4 chronic kidney disease, or unspecified chronic kidney disease: Secondary | ICD-10-CM | POA: Insufficient documentation

## 2023-02-16 DIAGNOSIS — N1831 Chronic kidney disease, stage 3a: Secondary | ICD-10-CM | POA: Insufficient documentation

## 2023-02-16 DIAGNOSIS — I700 Atherosclerosis of aorta: Secondary | ICD-10-CM | POA: Insufficient documentation

## 2023-02-16 LAB — MICROALBUMIN / CREATININE URINE RATIO
Creatinine, Urine: 68.5 mg/dL
Microalb/Creat Ratio: 5 mg/g creat (ref 0–29)
Microalbumin, Urine: 3.1 ug/mL

## 2023-02-18 ENCOUNTER — Telehealth: Payer: Self-pay | Admitting: *Deleted

## 2023-02-18 NOTE — Telephone Encounter (Signed)
Contacted regarding PREP Class referral. Left voice message to return call for more information. 

## 2023-03-04 ENCOUNTER — Ambulatory Visit: Payer: Medicare Other | Admitting: Podiatry

## 2023-03-04 DIAGNOSIS — M79675 Pain in left toe(s): Secondary | ICD-10-CM

## 2023-03-04 DIAGNOSIS — L84 Corns and callosities: Secondary | ICD-10-CM | POA: Diagnosis not present

## 2023-03-04 DIAGNOSIS — M79674 Pain in right toe(s): Secondary | ICD-10-CM | POA: Diagnosis not present

## 2023-03-04 DIAGNOSIS — B351 Tinea unguium: Secondary | ICD-10-CM | POA: Diagnosis not present

## 2023-03-04 DIAGNOSIS — M216X2 Other acquired deformities of left foot: Secondary | ICD-10-CM

## 2023-03-04 MED ORDER — CICLOPIROX 0.77 % EX GEL: 1.0000 | Freq: Two times a day (BID) | CUTANEOUS | 2 refills | Status: AC

## 2023-03-04 NOTE — Progress Notes (Unsigned)
Subjective:   Patient ID: Tricia Ferguson, female   DOB: 89 y.o.   MRN: [id number]   HPI Chief Complaint  Patient presents with   Foot Problem    possible fungus between toes, that itches and burns sometimes/ tried otc medication    She has been using neosporin and powder in between the toe right 4th interspace Hammertoes bother her   ROS      Objective:  Physical Exam  ***    Callus left 3rd distal, sub 3  Assessment:  ***     Plan:  ***    -submit pre determination for callus

## 2023-03-21 DIAGNOSIS — H35363 Drusen (degenerative) of macula, bilateral: Secondary | ICD-10-CM | POA: Diagnosis not present

## 2023-03-21 DIAGNOSIS — H353221 Exudative age-related macular degeneration, left eye, with active choroidal neovascularization: Secondary | ICD-10-CM | POA: Diagnosis not present

## 2023-03-21 DIAGNOSIS — H35722 Serous detachment of retinal pigment epithelium, left eye: Secondary | ICD-10-CM | POA: Diagnosis not present

## 2023-03-21 DIAGNOSIS — H353111 Nonexudative age-related macular degeneration, right eye, early dry stage: Secondary | ICD-10-CM | POA: Diagnosis not present

## 2023-04-04 DIAGNOSIS — M7061 Trochanteric bursitis, right hip: Secondary | ICD-10-CM | POA: Diagnosis not present

## 2023-04-04 DIAGNOSIS — M25561 Pain in right knee: Secondary | ICD-10-CM | POA: Diagnosis not present

## 2023-04-04 DIAGNOSIS — M47816 Spondylosis without myelopathy or radiculopathy, lumbar region: Secondary | ICD-10-CM | POA: Diagnosis not present

## 2023-04-14 ENCOUNTER — Other Ambulatory Visit: Payer: Self-pay | Admitting: Internal Medicine

## 2023-04-18 ENCOUNTER — Other Ambulatory Visit: Payer: Self-pay | Admitting: Internal Medicine

## 2023-05-05 DIAGNOSIS — H353221 Exudative age-related macular degeneration, left eye, with active choroidal neovascularization: Secondary | ICD-10-CM | POA: Diagnosis not present

## 2023-05-19 DIAGNOSIS — M216X2 Other acquired deformities of left foot: Secondary | ICD-10-CM | POA: Diagnosis not present

## 2023-05-19 DIAGNOSIS — M79674 Pain in right toe(s): Secondary | ICD-10-CM | POA: Diagnosis not present

## 2023-05-19 DIAGNOSIS — B351 Tinea unguium: Secondary | ICD-10-CM | POA: Diagnosis not present

## 2023-05-19 DIAGNOSIS — M79675 Pain in left toe(s): Secondary | ICD-10-CM | POA: Diagnosis not present

## 2023-06-08 ENCOUNTER — Ambulatory Visit: Payer: Medicare Other | Admitting: Podiatry

## 2023-06-08 ENCOUNTER — Encounter: Payer: Self-pay | Admitting: Podiatry

## 2023-06-08 DIAGNOSIS — B351 Tinea unguium: Secondary | ICD-10-CM | POA: Diagnosis not present

## 2023-06-08 DIAGNOSIS — Q828 Other specified congenital malformations of skin: Secondary | ICD-10-CM

## 2023-06-08 DIAGNOSIS — N1831 Chronic kidney disease, stage 3a: Secondary | ICD-10-CM

## 2023-06-08 DIAGNOSIS — M79675 Pain in left toe(s): Secondary | ICD-10-CM | POA: Diagnosis not present

## 2023-06-08 DIAGNOSIS — M216X2 Other acquired deformities of left foot: Secondary | ICD-10-CM

## 2023-06-08 DIAGNOSIS — L84 Corns and callosities: Secondary | ICD-10-CM | POA: Diagnosis not present

## 2023-06-08 DIAGNOSIS — M79674 Pain in right toe(s): Secondary | ICD-10-CM | POA: Diagnosis not present

## 2023-06-08 NOTE — Progress Notes (Signed)
This patient returns to my office for at risk foot care.  This patient requires this care by a professional since this patient will be at risk due to having CKD and PVD.  This patient is unable to cut nails herself since the patient cannot reach her nails.These nails are painful walking and wearing shoes. She also has painful callus under left forefoot which is painful when walking.  This patient presents for at risk foot care today.  General Appearance  Alert, conversant and in no acute stress.  Vascular  Dorsalis pedis and posterior tibial  pulses are  weakly palpable  bilaterally.  Capillary return is within normal limits  bilaterally. Cold feet   Neurologic  Senn-Weinstein monofilament wire test within normal limits  bilaterally. Muscle power within normal limits bilaterally.  Nails Thick disfigured discolored nails with subungual debris  from hallux to fifth toes bilaterally. No evidence of bacterial infection or drainage bilaterally.  Orthopedic  No limitations of motion  feet .  No crepitus or effusions noted.  No bony pathology or digital deformities noted.  Skin  normotropic skin noted bilaterally.  No signs of infections or ulcers noted.   Porokeratosis sub 4 left foot.  Blackened area behind medial malleolus  left foot.  Onychomycosis  Pain in right toes  Pain in left toes  Porokeratosis  B/L.  Consent was obtained for treatment procedures.   Mechanical debridement of nails 1-5  bilaterally performed with a nail nipper.  Filed with dremel without incident.  Debridement of callus left foot with # 15 blade .   Return office visit   3 months                   Told patient to return for periodic foot care and evaluation due to potential at risk complications.   Helane Gunther DPM

## 2023-06-29 ENCOUNTER — Ambulatory Visit: Payer: Medicare Other | Admitting: Family Medicine

## 2023-07-19 ENCOUNTER — Other Ambulatory Visit: Payer: Self-pay

## 2023-07-19 DIAGNOSIS — I1310 Hypertensive heart and chronic kidney disease without heart failure, with stage 1 through stage 4 chronic kidney disease, or unspecified chronic kidney disease: Secondary | ICD-10-CM

## 2023-07-19 MED ORDER — LISINOPRIL 5 MG PO TABS: 5.0000 mg | ORAL_TABLET | Freq: Every day | ORAL | 2 refills | Status: DC

## 2023-08-11 DIAGNOSIS — H353221 Exudative age-related macular degeneration, left eye, with active choroidal neovascularization: Secondary | ICD-10-CM | POA: Diagnosis not present

## 2023-08-17 ENCOUNTER — Ambulatory Visit
Admission: RE | Admit: 2023-08-17 | Discharge: 2023-08-17 | Disposition: A | Discharge status: Home / Self Care | Payer: Medicare Other | Source: Ambulatory Visit | Attending: Internal Medicine | Admitting: Internal Medicine

## 2023-08-17 DIAGNOSIS — M8588 Other specified disorders of bone density and structure, other site: Secondary | ICD-10-CM | POA: Diagnosis not present

## 2023-08-17 DIAGNOSIS — E349 Endocrine disorder, unspecified: Secondary | ICD-10-CM | POA: Diagnosis not present

## 2023-08-17 DIAGNOSIS — E2839 Other primary ovarian failure: Secondary | ICD-10-CM

## 2023-08-17 DIAGNOSIS — N189 Chronic kidney disease, unspecified: Secondary | ICD-10-CM | POA: Diagnosis not present

## 2023-08-24 ENCOUNTER — Encounter: Payer: Self-pay | Admitting: Internal Medicine

## 2023-08-24 ENCOUNTER — Ambulatory Visit (INDEPENDENT_AMBULATORY_CARE_PROVIDER_SITE_OTHER): Payer: Medicare Other | Admitting: Internal Medicine

## 2023-08-24 VITALS — BP 126/80 | HR 69 | Temp 98.7°F | Ht 60.0 in | Wt 184.8 lb

## 2023-08-24 DIAGNOSIS — Z6836 Body mass index (BMI) 36.0-36.9, adult: Secondary | ICD-10-CM

## 2023-08-24 DIAGNOSIS — Z0000 Encounter for general adult medical examination without abnormal findings: Secondary | ICD-10-CM

## 2023-08-24 DIAGNOSIS — N1831 Chronic kidney disease, stage 3a: Secondary | ICD-10-CM | POA: Diagnosis not present

## 2023-08-24 DIAGNOSIS — Z23 Encounter for immunization: Secondary | ICD-10-CM

## 2023-08-24 DIAGNOSIS — Z79899 Other long term (current) drug therapy: Secondary | ICD-10-CM

## 2023-08-24 DIAGNOSIS — M25532 Pain in left wrist: Secondary | ICD-10-CM

## 2023-08-24 DIAGNOSIS — I1310 Hypertensive heart and chronic kidney disease without heart failure, with stage 1 through stage 4 chronic kidney disease, or unspecified chronic kidney disease: Secondary | ICD-10-CM

## 2023-08-24 DIAGNOSIS — G4733 Obstructive sleep apnea (adult) (pediatric): Secondary | ICD-10-CM

## 2023-08-24 DIAGNOSIS — R7309 Other abnormal glucose: Secondary | ICD-10-CM

## 2023-08-24 DIAGNOSIS — I700 Atherosclerosis of aorta: Secondary | ICD-10-CM | POA: Diagnosis not present

## 2023-08-24 DIAGNOSIS — F5101 Primary insomnia: Secondary | ICD-10-CM

## 2023-08-24 LAB — POCT URINALYSIS DIPSTICK
Bilirubin, UA: NEGATIVE
Blood, UA: NEGATIVE
Glucose, UA: NEGATIVE
Ketones, UA: NEGATIVE
Leukocytes, UA: NEGATIVE
Nitrite, UA: NEGATIVE
Protein, UA: NEGATIVE
Spec Grav, UA: 1.015 (ref 1.010–1.025)
Urobilinogen, UA: 0.2 U/dL
pH, UA: 5.5 (ref 5.0–8.0)

## 2023-08-24 NOTE — Assessment & Plan Note (Signed)

## 2023-08-24 NOTE — Assessment & Plan Note (Signed)
Previous labs reviewed, her A1c has been elevated in the past. I will check an A1c today. Reminded to avoid refined sugars including sugary drinks/foods and processed meats including bacon, sausages and deli meats.  

## 2023-08-24 NOTE — Assessment & Plan Note (Signed)
Chronic, LDL goal < 70. She should be on statin therapy. Would prefer to not add additional meds, will readress at next visit.

## 2023-08-24 NOTE — Assessment & Plan Note (Signed)
Chronic, reminded to avoid NSAIDs, stay well hydrated and keep BP well controlled to decrease risk of CKD progression.

## 2023-08-24 NOTE — Assessment & Plan Note (Signed)
She is encouraged to strive for BMI less than 30 to decrease cardiac risk. Advised to aim for at least 150 minutes of exercise per week.  

## 2023-08-24 NOTE — Progress Notes (Signed)
I,Victoria T Deloria Lair, CMA,acting as a Neurosurgeon for Gwynneth Aliment, MD.,have documented all relevant documentation on the behalf of Gwynneth Aliment, MD,as directed by  Gwynneth Aliment, MD while in the presence of Gwynneth Aliment, MD.  Subjective:    Patient ID: Tricia Ferguson , female    DOB: 07-05-1934 , 89 y.o.   MRN: [id number]  Chief Complaint  Patient presents with   Annual Exam   Hypertension    HPI  She is here today for a full physical examination. She is no longer seen by GYN. She has no new concerns or complaints at this time. She reports compliance with meds. Denies headaches, chest pain and shortness of breath. Wants to be examined in chair, she does not wish to get onto exam table.   Hypertension This is a chronic problem. The current episode started more than 1 year ago. The problem has been gradually improving since onset. The problem is controlled. Pertinent negatives include no blurred vision. Risk factors for coronary artery disease include obesity, post-menopausal state and sedentary lifestyle. Past treatments include ACE inhibitors. The current treatment provides moderate improvement. Compliance problems include exercise.      Past Medical History:  Diagnosis Date   Chronic kidney disease    Hypertension    Peripheral vascular disease (HCC)    Vitamin D deficiency disease      Family History  Problem Relation Age of Onset   Hypertension Mother    Throat cancer Mother    Prostate cancer Father    Tremor Father    Hypertension Sister    Diabetes Sister    Hypertension Brother    Hypertension Sister    Diabetes Sister    Hypertension Sister      Current Outpatient Medications:    Acetaminophen (TYLENOL PO), Take by mouth., Disp: , Rfl:    Ascorbic Acid (VITAMIN C PO), Take by mouth., Disp: , Rfl:    calcium carbonate (TUMS - DOSED IN MG ELEMENTAL CALCIUM) 500 MG chewable tablet, Chew 2 tablets by mouth daily. , Disp: , Rfl:    Cholecalciferol  (VITAMIN D3 PO), 1 cap PO QD, Disp: , Rfl:    Ciclopirox 0.77 % gel, Apply 1 Application topically 2 (two) times daily., Disp: 45 g, Rfl: 2   Garlic 1000 MG CAPS, Take by mouth., Disp: , Rfl:    glucosamine-chondroitin 500-400 MG tablet, Take 1 tablet by mouth 3 (three) times daily., Disp: , Rfl:    levocetirizine (XYZAL) 5 MG tablet, TAKE 1 TABLET(5 MG) BY MOUTH EVERY EVENING, Disp: 90 tablet, Rfl: 1   lisinopril (ZESTRIL) 5 MG tablet, Take 1 tablet (5 mg total) by mouth daily., Disp: 90 tablet, Rfl: 2   Multiple Vitamin (MULTIVITAMIN) tablet, Take 1 tablet by mouth daily., Disp: , Rfl:    Multiple Vitamins-Minerals (PRESERVISION AREDS PO), Take by mouth., Disp: , Rfl:    Omega-3 Fatty Acids (FISH OIL) 1000 MG CAPS, Take 1 tablet by mouth daily., Disp: , Rfl:    Allergies  Allergen Reactions   Amoxicillin Itching   Penicillins Hives   Potassium-Containing Compounds Itching      The patient states she uses post menopausal status for birth control. No LMP recorded. Patient has had a hysterectomy.. Negative for Dysmenorrhea. Negative for: breast discharge, breast lump(s), breast pain and breast self exam. Associated symptoms include abnormal vaginal bleeding. Pertinent negatives include abnormal bleeding (hematology), anxiety, decreased libido, depression, difficulty falling sleep, dyspareunia, history of infertility, nocturia, sexual dysfunction,  sleep disturbances, urinary incontinence, urinary urgency, vaginal discharge and vaginal itching. Diet regular.The patient states her exercise level is  minimal.  . The patient's tobacco use is:  Social History   Tobacco Use  Smoking Status Former   Current packs/day: 0.00   Average packs/day: 1 pack/day for 20.0 years (20.0 ttl pk-yrs)   Types: Cigarettes   Start date: 10   Quit date: 2004   Years since quitting: 20.7  Smokeless Tobacco Never  Tobacco Comments   n/a  . She has been exposed to passive smoke. The patient's alcohol use is:   Social History   Substance and Sexual Activity  Alcohol Use No      Review of Systems  Constitutional: Negative.   HENT: Negative.    Eyes: Negative.  Negative for blurred vision.  Respiratory: Negative.    Cardiovascular: Negative.   Gastrointestinal: Negative.   Endocrine: Negative.   Genitourinary: Negative.   Musculoskeletal: Negative.   Skin: Negative.   Allergic/Immunologic: Negative.   Neurological: Negative.   Hematological: Negative.   Psychiatric/Behavioral: Negative.       Today's Vitals   08/24/23 1053  BP: 126/80  Pulse: 69  Temp: 98.7 F (37.1 C)  SpO2: 98%  Weight: 184 lb 12.8 oz (83.8 kg)  Height: 5' (1.524 m)   Body mass index is 36.09 kg/m.  Wt Readings from Last 3 Encounters:  08/24/23 184 lb 12.8 oz (83.8 kg)  02/15/23 183 lb 12.8 oz (83.4 kg)  09/08/22 184 lb 3.2 oz (83.6 kg)     Objective:  Physical Exam Vitals and nursing note reviewed.  Constitutional:      Appearance: Normal appearance. She is obese.     Comments: Examined while seated in chair with pants/shoes on per her request  HENT:     Head: Normocephalic and atraumatic.     Right Ear: Tympanic membrane, ear canal and external ear normal.     Left Ear: Tympanic membrane, ear canal and external ear normal.     Nose: Nose normal.     Mouth/Throat:     Mouth: Mucous membranes are moist.     Pharynx: Oropharynx is clear.  Eyes:     Extraocular Movements: Extraocular movements intact.     Conjunctiva/sclera: Conjunctivae normal.     Pupils: Pupils are equal, round, and reactive to light.  Cardiovascular:     Rate and Rhythm: Normal rate and regular rhythm.     Pulses: Normal pulses.     Heart sounds: Normal heart sounds.  Pulmonary:     Effort: Pulmonary effort is normal.     Breath sounds: Normal breath sounds.  Chest:  Breasts:    Tanner Score is 5.     Comments: pendulous Abdominal:     General: Bowel sounds are normal.     Palpations: Abdomen is soft.      Comments: Obese, soft. Difficult to assess organomegaly  Genitourinary:    Comments: deferred Musculoskeletal:        General: Normal range of motion.     Cervical back: Normal range of motion and neck supple.     Comments: Ambulatory with cane  Skin:    General: Skin is warm and dry.  Neurological:     General: No focal deficit present.     Mental Status: She is alert and oriented to person, place, and time.  Psychiatric:        Mood and Affect: Mood normal.        Behavior:  Behavior normal.         Assessment And Plan:     Encounter for general adult medical examination w/o abnormal findings Assessment & Plan: A full exam was performed.  Importance of monthly self breast exams was discussed with the patient.  She is advised to get 30-45 minutes of regular exercise, no less than four to five days per week. Both weight-bearing and aerobic exercises are recommended.  She is advised to follow a healthy diet with at least six fruits/veggies per day, decrease intake of red meat and other saturated fats and to increase fish intake to twice weekly.  Meats/fish should not be fried -- baked, boiled or broiled is preferable. It is also important to cut back on your sugar intake.  Be sure to read labels - try to avoid anything with added sugar, high fructose corn syrup or other sweeteners.  If you must use a sweetener, you can try stevia or monkfruit.  It is also important to avoid artificially sweetened foods/beverages and diet drinks. Lastly, wear SPF 50 sunscreen on exposed skin and when in direct sunlight for an extended period of time.  Be sure to avoid fast food restaurants and aim for at least 60 ounces of water daily.       Hypertensive heart and renal disease with renal failure, stage 1 through stage 4 or unspecified chronic kidney disease, without heart failure Assessment & Plan: Chronic, controlled.  EKG performed, NSR w/ first degree A-V block, left axis -anterior fascicular block,  voltage criteria for LVH, and old anteroseptal infarct. She will continue with lisinopril 5mg  daily. Reminded to follow a low sodium diet. She will f/u in four to six months.   Orders: -     POCT urinalysis dipstick -     Microalbumin / creatinine urine ratio -     EKG 12-Lead -     CBC -     CMP14+EGFR -     Lipid panel -     TSH -     Amb Referral To Provider Referral Exercise Program (P.R.E.P)  Stage 3a chronic kidney disease (HCC) Assessment & Plan: Chronic, reminded to avoid NSAIDs, stay well hydrated and keep BP well controlled to decrease risk of CKD progression.   Orders: -     Amb Referral To Provider Referral Exercise Program (P.R.E.P)  Aortic atherosclerosis (HCC) Assessment & Plan: Chronic, LDL goal < 70. She should be on statin therapy. Would prefer to not add additional meds, will readress at next visit.    Orders: -     TSH -     Amb Referral To Provider Referral Exercise Program (P.R.E.P)  OSA (obstructive sleep apnea) Assessment & Plan: Chronic, encouraged to wear CPAP for at least four hours per night.    Abnormal glucose Assessment & Plan: Previous labs reviewed, her A1c has been elevated in the past. I will check an A1c today. Reminded to avoid refined sugars including sugary drinks/foods and processed meats including bacon, sausages and deli meats.    Orders: -     Hemoglobin A1c  Primary insomnia Assessment & Plan: Chronic, states her sx started "a long time ago". Sx improved when she takes Tylenol, no more than twice weekly.    Class 2 severe obesity due to excess calories with serious comorbidity and body mass index (BMI) of 36.0 to 36.9 in adult [facility]) Assessment & Plan: She is encouraged to strive for BMI less than 30 to decrease cardiac risk. Advised to aim  for at least 150 minutes of exercise per week.   Orders: -     Amb Referral To Provider Referral Exercise Program (P.R.E.P)  Immunization due -     Flu Vaccine Trivalent High Dose  (Fluad)  Drug therapy -     Vitamin B12  She is encouraged to strive for BMI less than 30 to decrease cardiac risk. Advised to aim for at least 150 minutes of exercise per week.    Return for 1 YEAR HM, 6 month bpc. . Patient was given opportunity to ask questions. Patient verbalized understanding of the plan and was able to repeat key elements of the plan. All questions were answered to their satisfaction.    I, Gwynneth Aliment, MD, have reviewed all documentation for this visit. The documentation on 08/24/23 for the exam, diagnosis, procedures, and orders are all accurate and complete.

## 2023-08-24 NOTE — Assessment & Plan Note (Signed)
Chronic, states her sx started "a long time ago". Sx improved when she takes Tylenol, no more than twice weekly.

## 2023-08-24 NOTE — Patient Instructions (Signed)

## 2023-08-25 LAB — MICROALBUMIN / CREATININE URINE RATIO
Creatinine, Urine: 37.5 mg/dL
Microalb/Creat Ratio: <8 mg/g{creat} (ref 0–29)
Microalbumin, Urine: <3 ug/mL

## 2023-08-25 LAB — CMP14+EGFR
ALT: 22 IU/L (ref 0–32)
AST: 24 IU/L (ref 0–40)
Albumin: 4.1 g/dL (ref 3.7–4.7)
Alkaline Phosphatase: 90 IU/L (ref 44–121)
BUN/Creatinine Ratio: 29 — ABNORMAL HIGH (ref 12–28)
BUN: 25 mg/dL (ref 8–27)
Bilirubin Total: 0.4 mg/dL (ref 0.0–1.2)
CO2: 24 mmol/L (ref 20–29)
Calcium: 9.5 mg/dL (ref 8.7–10.3)
Chloride: 101 mmol/L (ref 96–106)
Creatinine, Ser: 0.86 mg/dL (ref 0.57–1.00)
Globulin, Total: 2.8 g/dL (ref 1.5–4.5)
Glucose: 86 mg/dL (ref 70–99)
Potassium: 4.2 mmol/L (ref 3.5–5.2)
Sodium: 138 mmol/L (ref 134–144)
Total Protein: 6.9 g/dL (ref 6.0–8.5)
eGFR: 65 mL/min/{1.73_m2} (ref >59)

## 2023-08-25 LAB — LIPID PANEL
Chol/HDL Ratio: 2.3 ratio (ref 0.0–4.4)
Cholesterol, Total: 176 mg/dL (ref 100–199)
HDL: 75 mg/dL (ref >39)
LDL Chol Calc (NIH): 88 mg/dL (ref 0–99)
Triglycerides: 71 mg/dL (ref 0–149)
VLDL Cholesterol Cal: 13 mg/dL (ref 5–40)

## 2023-08-25 LAB — CBC
Hematocrit: 36.7 % (ref 34.0–46.6)
Hemoglobin: 11.3 g/dL (ref 11.1–15.9)
MCH: 29.6 pg (ref 26.6–33.0)
MCHC: 30.8 g/dL — ABNORMAL LOW (ref 31.5–35.7)
MCV: 96 fL (ref 79–97)
Platelets: 259 10*3/uL (ref 150–450)
RBC: 3.82 x10E6/uL (ref 3.77–5.28)
RDW: 11.9 % (ref 11.7–15.4)
WBC: 8 10*3/uL (ref 3.4–10.8)

## 2023-08-25 LAB — HEMOGLOBIN A1C
Est. average glucose Bld gHb Est-mCnc: 100 mg/dL
Hgb A1c MFr Bld: 5.1 % (ref 4.8–5.6)

## 2023-08-25 LAB — TSH: TSH: 1.7 u[IU]/mL (ref 0.450–4.500)

## 2023-08-25 LAB — VITAMIN B12: Vitamin B-12: 836 pg/mL (ref 232–1245)

## 2023-08-26 ENCOUNTER — Telehealth: Payer: Self-pay

## 2023-08-26 NOTE — Telephone Encounter (Signed)
VMT pt requesting call back reference PREP referral. Next class starting at [facility] with afternoon classes in Oct.

## 2023-09-04 NOTE — Assessment & Plan Note (Signed)
Chronic, encouraged to wear CPAP for at least four hours per night.

## 2023-09-04 NOTE — Assessment & Plan Note (Addendum)
Chronic, controlled.  EKG performed, NSR w/ first degree A-V block, left axis -anterior fascicular block, voltage criteria for LVH, and old anteroseptal infarct. She will continue with lisinopril 5mg  daily. Reminded to follow a low sodium diet. She will f/u in four to six months.

## 2023-09-05 ENCOUNTER — Telehealth: Payer: Self-pay

## 2023-09-05 NOTE — Telephone Encounter (Signed)
Attempted to reach pt reference starting PREP in Oct. Unable to leave message after >10 rings

## 2023-09-08 ENCOUNTER — Ambulatory Visit: Payer: Medicare Other | Admitting: Podiatry

## 2023-09-14 ENCOUNTER — Ambulatory Visit: Payer: Medicare Other

## 2023-09-14 VITALS — BP 126/64 | HR 86 | Temp 98.5°F | Ht 60.0 in | Wt 185.2 lb

## 2023-09-14 DIAGNOSIS — Z0000 Encounter for general adult medical examination without abnormal findings: Secondary | ICD-10-CM | POA: Diagnosis not present

## 2023-09-14 NOTE — Progress Notes (Signed)
Subjective:   Tricia Ferguson is a 89 y.o. female who presents for Medicare Annual (Subsequent) preventive examination.  Visit Complete: In person    Cardiac Risk Factors include: advanced age (>68men, >31 women);hypertension;obesity (BMI >30kg/m2)     Objective:    Today's Vitals   09/14/23 0904 09/14/23 0905  BP: 126/64   Pulse: 86   Temp: 98.5 F (36.9 C)   TempSrc: Oral   SpO2: 97%   Weight: 185 lb 3.2 oz (84 kg)   Height: 5' (1.524 m)   PainSc:  8    Body mass index is 36.17 kg/m.     09/14/2023    9:12 AM 09/08/2022    9:17 AM 01/11/2022    9:13 AM 08/13/2021   11:34 AM 07/16/2020    9:42 AM 06/27/2019    9:31 AM 09/29/2017    3:00 PM  Advanced Directives  Does Patient Have a Medical Advance Directive? No No No No No No No  Would patient like information on creating a medical advance directive? No - Patient declined No - Patient declined  No - Patient declined No - Patient declined Yes (MAU/Ambulatory/Procedural Areas - Information given)     Current Medications (verified) Outpatient Encounter Medications as of 09/14/2023  Medication Sig   Acetaminophen (TYLENOL PO) Take by mouth.   Ascorbic Acid (VITAMIN C PO) Take by mouth.   calcium carbonate (TUMS - DOSED IN MG ELEMENTAL CALCIUM) 500 MG chewable tablet Chew 2 tablets by mouth daily.    Cholecalciferol (VITAMIN D3 PO) 1 cap PO QD   Ciclopirox 0.77 % gel Apply 1 Application topically 2 (two) times daily.   Garlic 1000 MG CAPS Take by mouth.   glucosamine-chondroitin 500-400 MG tablet Take 1 tablet by mouth 3 (three) times daily.   levocetirizine (XYZAL) 5 MG tablet TAKE 1 TABLET(5 MG) BY MOUTH EVERY EVENING   lisinopril (ZESTRIL) 5 MG tablet Take 1 tablet (5 mg total) by mouth daily.   Multiple Vitamin (MULTIVITAMIN) tablet Take 1 tablet by mouth daily.   Multiple Vitamins-Minerals (PRESERVISION AREDS PO) Take by mouth.   Omega-3 Fatty Acids (FISH OIL) 1000 MG CAPS Take 1 tablet by mouth daily.    No facility-administered encounter medications on file as of 09/14/2023.    Allergies (verified) Amoxicillin, Penicillins, and Potassium-containing compounds   History: Past Medical History:  Diagnosis Date   Chronic kidney disease    Hypertension    Peripheral vascular disease (HCC)    Vitamin D deficiency disease    Past Surgical History:  Procedure Laterality Date   ABDOMINAL HYSTERECTOMY     ESOPHAGOGASTRODUODENOSCOPY (EGD) WITH PROPOFOL N/A 01/11/2022   Procedure: ESOPHAGOGASTRODUODENOSCOPY (EGD) WITH PROPOFOL;  Surgeon: Wyline Mood, MD;  Location: [facility] ENDOSCOPY;  Service: Gastroenterology;  Laterality: N/A;   HEMORRHOID SURGERY     Family History  Problem Relation Age of Onset   Hypertension Mother    Throat cancer Mother    Prostate cancer Father    Tremor Father    Hypertension Sister    Diabetes Sister    Hypertension Brother    Hypertension Sister    Diabetes Sister    Hypertension Sister    Social History   Socioeconomic History   Marital status: Widowed    Spouse name: Not on file   Number of children: 3   Years of education: Not on file   Highest education level: Not on file  Occupational History   Occupation: retired  Tobacco Use   Smoking  status: Former    Current packs/day: 0.00    Average packs/day: 1 pack/day for 20.0 years (20.0 ttl pk-yrs)    Types: Cigarettes    Start date: 24    Quit date: 2004    Years since quitting: 20.7   Smokeless tobacco: Never   Tobacco comments:    n/a  Vaping Use   Vaping status: Never Used  Substance and Sexual Activity   Alcohol use: No   Drug use: No   Sexual activity: Not Currently  Other Topics Concern   Not on file  Social History Narrative   Not on file   Social Determinants of Health   Financial Resource Strain: Low Risk  (09/14/2023)   Overall Financial Resource Strain (CARDIA)    Difficulty of Paying Living Expenses: Not hard at all  Food Insecurity: No Food Insecurity (09/14/2023)    Hunger Vital Sign    Worried About Running Out of Food in the Last Year: Never true    Ran Out of Food in the Last Year: Never true  Transportation Needs: No Transportation Needs (09/14/2023)   PRAPARE - Administrator, Civil Service (Medical): No    Lack of Transportation (Non-Medical): No  Physical Activity: Sufficiently Active (09/14/2023)   Exercise Vital Sign    Days of Exercise per Week: 7 days    Minutes of Exercise per Session: 30 min  Stress: No Stress Concern Present (09/14/2023)   [employer] of Occupational Health - Occupational Stress Questionnaire    Feeling of Stress : Not at all  Social Connections: Moderately Integrated (09/14/2023)   Social Connection and Isolation Panel [NHANES]    Frequency of Communication with Friends and Family: More than three times a week    Frequency of Social Gatherings with Friends and Family: Once a week    Attends Religious Services: More than 4 times per year    Active Member of [employer] or Organizations: Yes    Attends Banker Meetings: More than 4 times per year    Marital Status: Widowed    Tobacco Counseling Counseling given: Not Answered Tobacco comments: n/a   Clinical Intake:  Pre-visit preparation completed: Yes  Pain : 0-10 Pain Score: 8  Pain Type: Chronic pain Pain Location: Knee Pain Orientation: Right Pain Descriptors / Indicators: Aching Pain Onset: More than a month ago Pain Frequency: Constant     Nutritional Status: BMI > 30  Obese Nutritional Risks: None Diabetes: No  How often do you need to have someone help you when you read instructions, pamphlets, or other written materials from your doctor or pharmacy?: 1 - Never     Information entered by :: NAllen LPN   Activities of Daily Living    09/14/2023    9:06 AM  In your present state of health, do you have any difficulty performing the following activities:  Hearing? 0  Vision? 1  Comment blurry in left eye sometimes   Difficulty concentrating or making decisions? 0  Walking or climbing stairs? 1  Comment due to knee  Dressing or bathing? 0  Doing errands, shopping? 0  Preparing Food and eating ? N  Using the Toilet? N  In the past six months, have you accidently leaked urine? N  Do you have problems with loss of bowel control? N  Managing your Medications? N  Managing your Finances? N  Housekeeping or managing your Housekeeping? N    Patient Care Team: Dorothyann Peng, MD as PCP - General (  Internal Medicine) Janet Berlin, MD as Consulting Physician (Ophthalmology)  Indicate any recent Medical Services you may have received from other than [facility] providers in the past year (date may be approximate).     Assessment:   This is a routine wellness examination for Katessa.  Hearing/Vision screen Hearing Screening - Comments:: Denies hearing issues Vision Screening - Comments:: Regular eye exams, Dr. Burgess Estelle, Dr. Allena Katz   Goals Addressed             This Visit's Progress    Patient Stated       09/14/2023, wants to lose weight       Depression Screen    09/14/2023    9:14 AM 08/24/2023   10:49 AM 02/15/2023   10:36 AM 09/08/2022    9:18 AM 08/17/2022   10:14 AM 08/13/2021   11:35 AM 08/10/2021   11:09 AM  PHQ 2/9 Scores  PHQ - 2 Score 0 0 0 0 0 0 0  PHQ- 9 Score 0 0         Fall Risk    09/14/2023    9:13 AM 08/24/2023   10:49 AM 02/15/2023   10:36 AM 09/08/2022    9:18 AM 08/17/2022   10:14 AM  Fall Risk   Falls in the past year? 0 0 0 0 0  Number falls in past yr: 0 0 0 0 0  Injury with Fall? 0 0 0 0 0  Risk for fall due to : Medication side effect;Impaired mobility;Impaired balance/gait No Fall Risks No Fall Risks Impaired balance/gait;Impaired mobility;Medication side effect History of fall(s)  Follow up Falls prevention discussed;Falls evaluation completed Falls evaluation completed Falls evaluation completed Falls prevention discussed;Falls evaluation completed;Education provided  Falls evaluation completed    MEDICARE RISK AT HOME: Medicare Risk at Home Any stairs in or around the home?: No If so, are there any without handrails?: No Home free of loose throw rugs in walkways, pet beds, electrical cords, etc?: Yes Adequate lighting in your home to reduce risk of falls?: Yes Life alert?: No Use of a cane, walker or w/c?: Yes Grab bars in the bathroom?: Yes Shower chair or bench in shower?: No Elevated toilet seat or a handicapped toilet?: Yes  TIMED UP AND GO:  Was the test performed?  Yes  Length of time to ambulate 10 feet: 7 sec Gait slow and steady with assistive device    Cognitive Function:        09/14/2023    9:15 AM 09/08/2022    9:19 AM 08/13/2021   11:37 AM 07/16/2020    9:46 AM 06/27/2019    9:39 AM  6CIT Screen  What Year? 0 points 0 points 0 points 0 points 0 points  What month? 0 points 0 points 0 points 0 points 0 points  What time? 0 points 0 points 0 points 0 points 0 points  Count back from 20 0 points 0 points 0 points 0 points 0 points  Months in reverse 0 points 0 points 0 points 0 points 0 points  Repeat phrase 0 points 0 points 0 points 0 points 0 points  Total Score 0 points 0 points 0 points 0 points 0 points    Immunizations Immunization History  Administered Date(s) Administered   Fluad Quad(high Dose 65+) 08/23/2022   Fluad Trivalent(High Dose 65+) 08/24/2023   Influenza, High Dose Seasonal PF 09/14/2014, 09/09/2015, 09/13/2016, 09/14/2017   Influenza-Unspecified 08/28/2018, 09/16/2021   PFIZER Comirnaty(Gray Top)Covid-19 Tri-Sucrose Vaccine 09/10/2022   PFIZER(Purple  Top)SARS-COV-2 Vaccination 01/19/2020, 02/09/2020, 10/06/2020, 04/02/2021   Pfizer Covid-19 Vaccine Bivalent Booster 5y-11y 10/12/2021   Pneumococcal Conjugate-13 05/16/2015   Pneumococcal Polysaccharide-23 12/21/2021   Tdap 06/27/2019   Zoster Recombinant(Shingrix) 07/16/2020, 09/13/2020    TDAP status: Up to date  Flu Vaccine status: Up to  date   Pneumococcal vaccine status: Up to date  Covid-19 vaccine status: Completed vaccines  Qualifies for Shingles Vaccine? Yes   Zostavax completed Yes   Shingrix Completed?: Yes  Screening Tests Health Maintenance  Topic Date Due   COVID-19 Vaccine (7 - 2023-24 season) 08/14/2023   Medicare Annual Wellness (AWV)  09/13/2024   DTaP/Tdap/Td (2 - Td or Tdap) 06/26/2029   Pneumonia Vaccine 68+ Years old  Completed   INFLUENZA VACCINE  Completed   DEXA SCAN  Completed   Zoster Vaccines- Shingrix  Completed   HPV VACCINES  Aged Out    Health Maintenance  Health Maintenance Due  Topic Date Due   COVID-19 Vaccine (7 - 2023-24 season) 08/14/2023    Colorectal cancer screening: No longer required.   Mammogram status: No longer required due to age.  Bone Density status: Completed 08/17/2023.   Lung Cancer Screening: (Low Dose CT Chest recommended if Age 36-80 years, 20 pack-year currently smoking OR have quit w/in 15years.) does not qualify.   Lung Cancer Screening Referral: no  Additional Screening:  Hepatitis C Screening: does not qualify;   Vision Screening: Recommended annual ophthalmology exams for early detection of glaucoma and other disorders of the eye. Is the patient up to date with their annual eye exam?  Yes  Who is the provider or what is the name of the office in which the patient attends annual eye exams? Dr. Allena Katz If pt is not established with a provider, would they like to be referred to a provider to establish care? No .   Dental Screening: Recommended annual dental exams for proper oral hygiene  Diabetic Foot Exam: n/a  Community Resource Referral / Chronic Care Management: CRR required this visit?  No   CCM required this visit?  No     Plan:     I have personally reviewed and noted the following in the patient's chart:   Medical and social history Use of alcohol, tobacco or illicit drugs  Current medications and supplements including opioid  prescriptions. Patient is not currently taking opioid prescriptions. Functional ability and status Nutritional status Physical activity Advanced directives List of other physicians Hospitalizations, surgeries, and ER visits in previous 12 months Vitals Screenings to include cognitive, depression, and falls Referrals and appointments  In addition, I have reviewed and discussed with patient certain preventive protocols, quality metrics, and best practice recommendations. A written personalized care plan for preventive services as well as general preventive health recommendations were provided to patient.     Barb Merino, LPN   16/12/958   After Visit Summary: (In Person-Printed) AVS printed and given to the patient  Nurse Notes: none

## 2023-09-14 NOTE — Patient Instructions (Signed)
Ms. Duffus , Thank you for taking time to come for your Medicare Wellness Visit. I appreciate your ongoing commitment to your health goals. Please review the following plan we discussed and let me know if I can assist you in the future.   Referrals/Orders/Follow-Ups/Clinician Recommendations: none  This is a list of the screening recommended for you and due dates:  Health Maintenance  Topic Date Due   COVID-19 Vaccine (7 - 2023-24 season) 08/14/2023   Medicare Annual Wellness Visit  09/13/2024   DTaP/Tdap/Td vaccine (2 - Td or Tdap) 06/26/2029   Pneumonia Vaccine  Completed   Flu Shot  Completed   DEXA scan (bone density measurement)  Completed   Zoster (Shingles) Vaccine  Completed   HPV Vaccine  Aged Out    Advanced directives: (Declined) Advance directive discussed with you today. Even though you declined this today, please call our office should you change your mind, and we can give you the proper paperwork for you to fill out.  Next Medicare Annual Wellness Visit scheduled for next year: No, office will schedule appointment  Insert Preventive Care attachment Insert FALL PREVENTION attachment if needed

## 2023-09-23 IMAGING — MG MM DIGITAL SCREENING BILAT W/ TOMO AND CAD
6 of 10 series · 6 of 30 positions shown · non-contrast
Comparison: Previous exam(s).

CLINICAL DATA: Screening.

EXAM:
DIGITAL SCREENING BILATERAL MAMMOGRAM WITH TOMOSYNTHESIS AND CAD
TECHNIQUE: Bilateral screening digital craniocaudal and mediolateral oblique
mammograms were obtained. Bilateral screening digital breast
tomosynthesis was performed. The images were evaluated with
computer-aided detection.

[R CC synth-2D (1 of 2)]
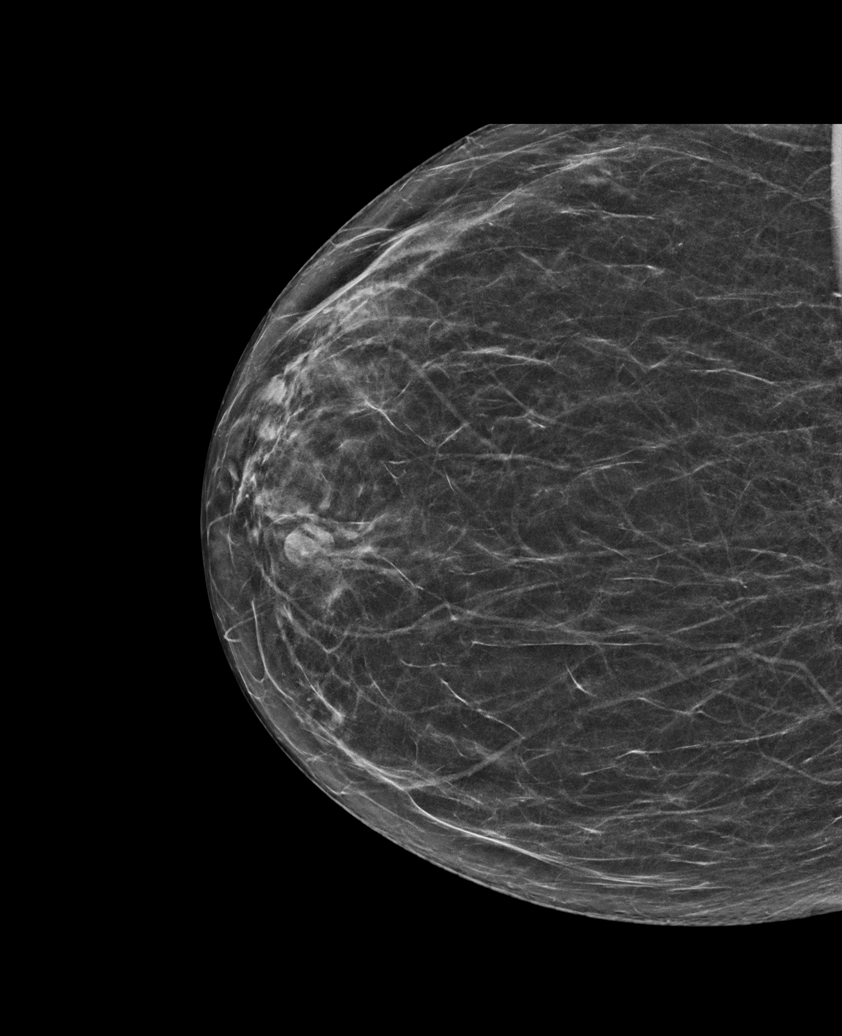

[R MLO synth-2D]
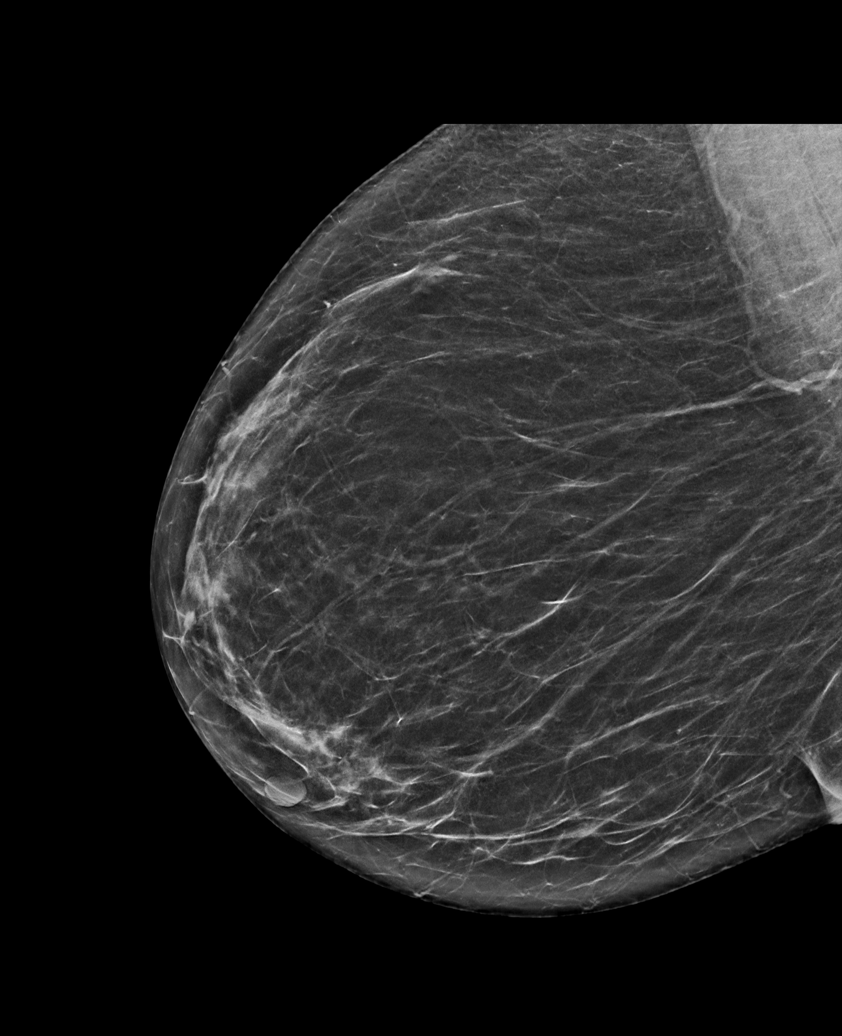

[L CC synth-2D]
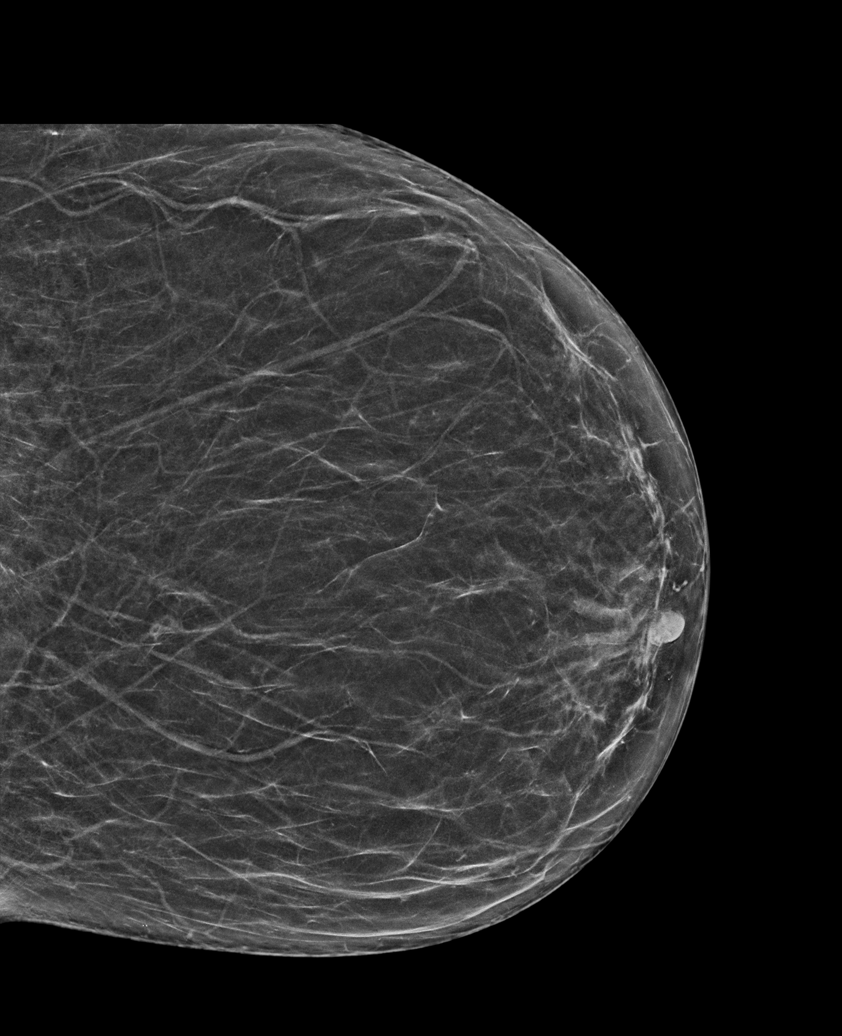

[L MLO synth-2D]
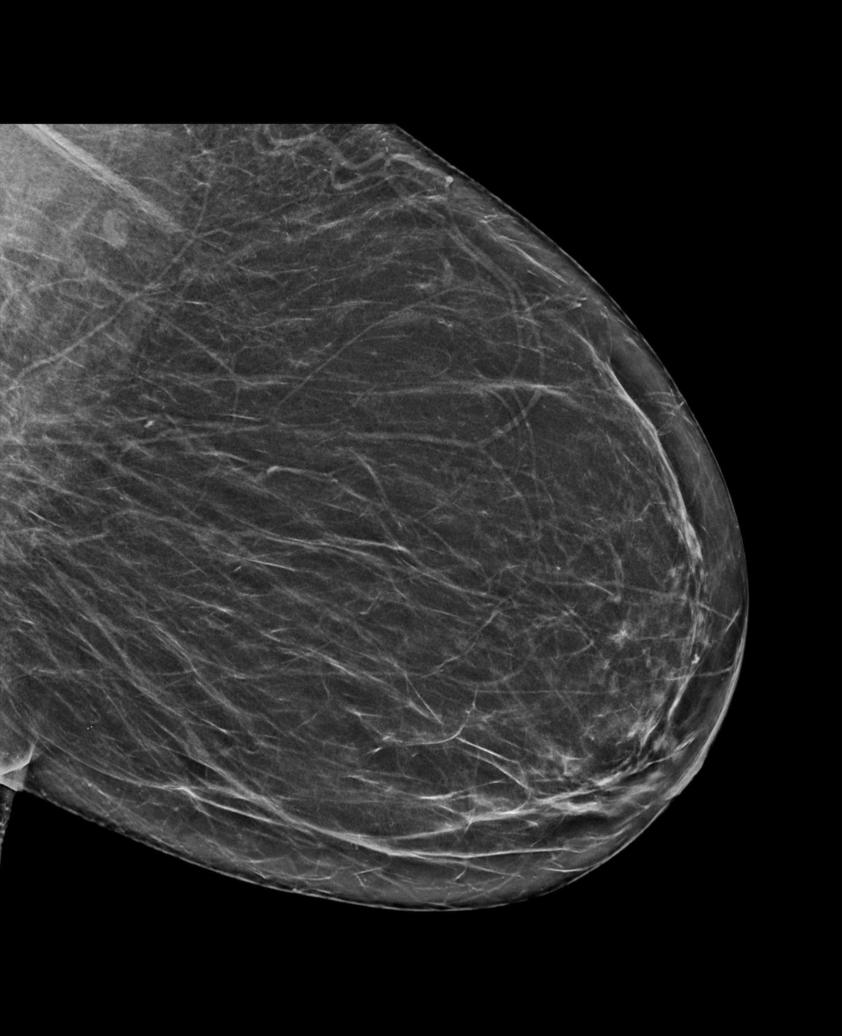

[R CC synth-2D (2 of 2)]
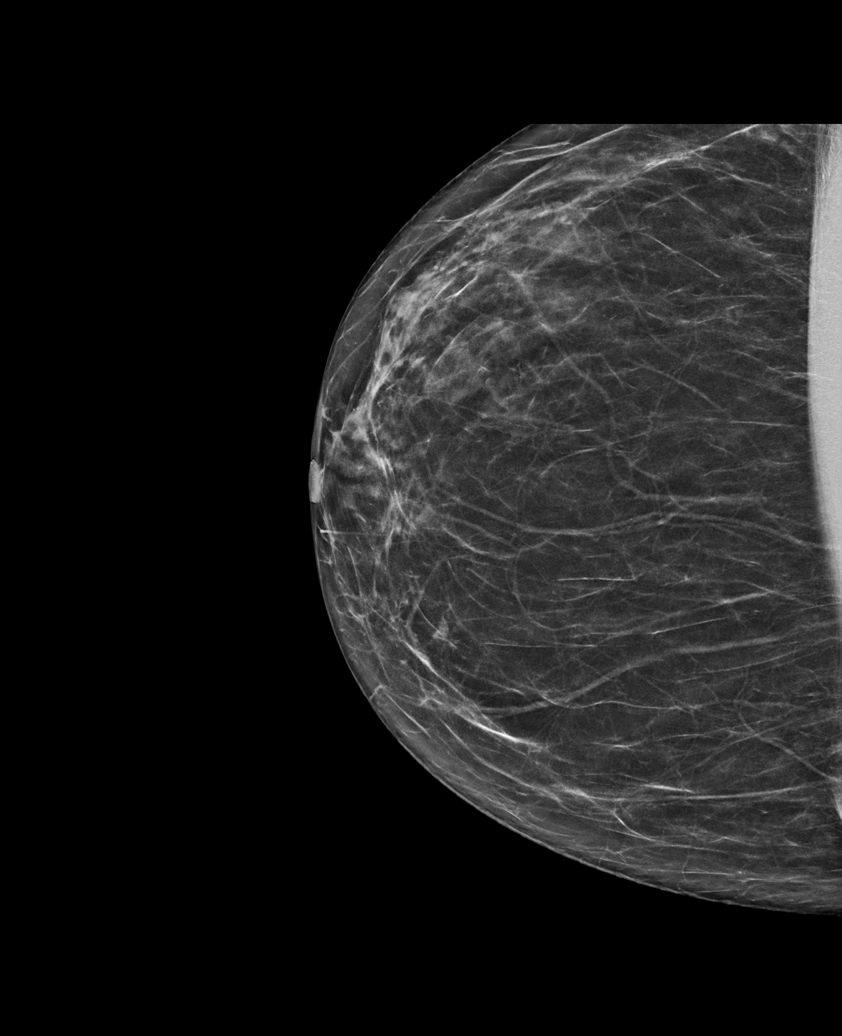

[R CC tomo · tomo slice 31/60.0]
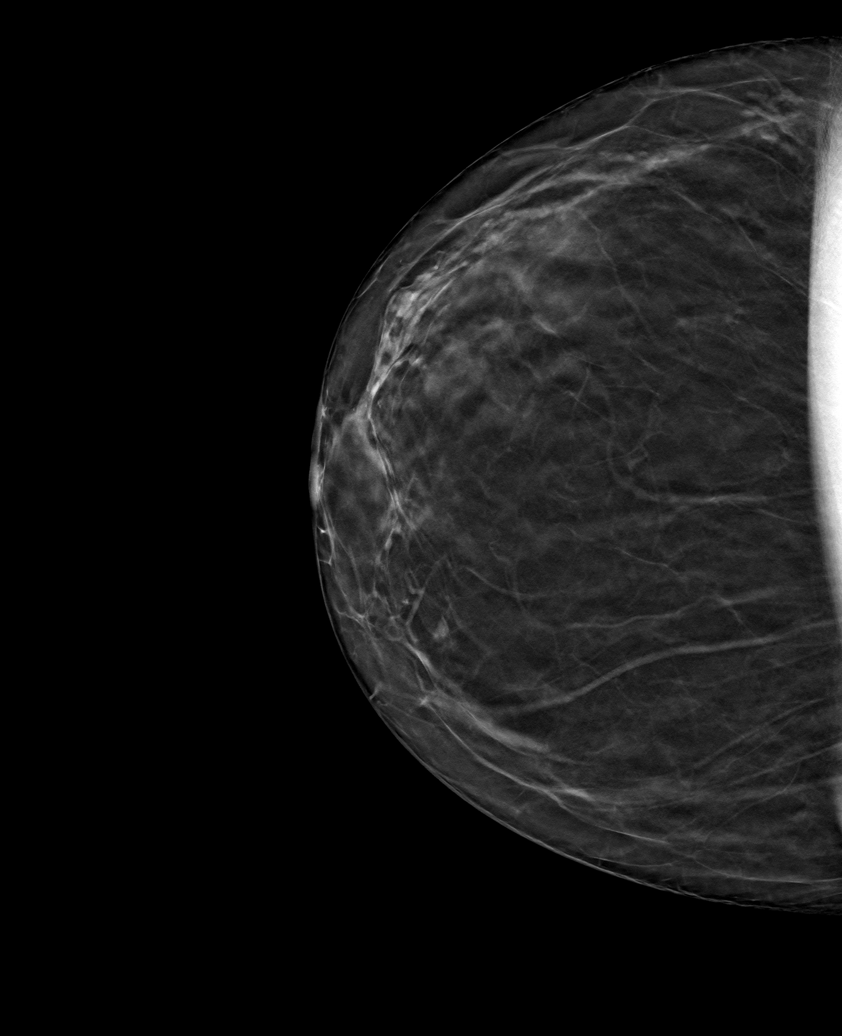

[6 of 30 positions shown; findings below may reference images not displayed]

ACR Breast Density Category b: There are scattered areas of
fibroglandular density.
FINDINGS: There are no findings suspicious for malignancy.
IMPRESSION: No mammographic evidence of malignancy. A result letter of this
screening mammogram will be mailed directly to the patient.

RECOMMENDATION:
Screening mammogram in one year. (Code:51-O-LD2)

BI-RADS CATEGORY  1: Negative.

## 2023-09-28 DIAGNOSIS — H524 Presbyopia: Secondary | ICD-10-CM | POA: Diagnosis not present

## 2023-10-03 DIAGNOSIS — H35363 Drusen (degenerative) of macula, bilateral: Secondary | ICD-10-CM | POA: Diagnosis not present

## 2023-10-03 DIAGNOSIS — H353221 Exudative age-related macular degeneration, left eye, with active choroidal neovascularization: Secondary | ICD-10-CM | POA: Diagnosis not present

## 2023-10-03 DIAGNOSIS — H353111 Nonexudative age-related macular degeneration, right eye, early dry stage: Secondary | ICD-10-CM | POA: Diagnosis not present

## 2023-10-03 DIAGNOSIS — H35453 Secondary pigmentary degeneration, bilateral: Secondary | ICD-10-CM | POA: Diagnosis not present

## 2023-10-12 ENCOUNTER — Other Ambulatory Visit: Payer: Self-pay | Admitting: Internal Medicine

## 2023-11-17 DIAGNOSIS — H353221 Exudative age-related macular degeneration, left eye, with active choroidal neovascularization: Secondary | ICD-10-CM | POA: Diagnosis not present

## 2023-12-08 DIAGNOSIS — H524 Presbyopia: Secondary | ICD-10-CM | POA: Diagnosis not present

## 2024-01-03 ENCOUNTER — Ambulatory Visit (HOSPITAL_COMMUNITY)
Admission: EM | Admit: 2024-01-03 | Discharge: 2024-01-03 | Disposition: A | Discharge status: Home / Self Care | Payer: Medicare Other | Attending: Family Medicine | Admitting: Family Medicine

## 2024-01-03 ENCOUNTER — Encounter (HOSPITAL_COMMUNITY): Payer: Self-pay

## 2024-01-03 DIAGNOSIS — R42 Dizziness and giddiness: Secondary | ICD-10-CM

## 2024-01-03 DIAGNOSIS — H9201 Otalgia, right ear: Secondary | ICD-10-CM | POA: Diagnosis not present

## 2024-01-03 DIAGNOSIS — H6991 Unspecified Eustachian tube disorder, right ear: Secondary | ICD-10-CM | POA: Diagnosis not present

## 2024-01-03 MED ORDER — PREDNISONE 20 MG PO TABS: 40.0000 mg | ORAL_TABLET | Freq: Every day | ORAL | 0 refills | Status: AC

## 2024-01-03 NOTE — ED Provider Notes (Signed)
[facility]    CSN: [id number] Arrival date & time: 01/03/24  1349      History   Chief Complaint Chief Complaint  Patient presents with   Otalgia    HPI Tricia Ferguson is a 89 y.o. female.    Otalgia  She has been having some pain to the right ear for over a week.  She feels a lot of noise, and hears "wind" through the ear.  She has has dizziness, feels that her dizziness is off.  She has pain into the right face/shoulder as well.  Some runny nose, congestion.  No fevers/chills.        Past Medical History:  Diagnosis Date   Chronic kidney disease    Hypertension    Peripheral vascular disease (HCC)    Vitamin D deficiency disease     Patient Active Problem List   Diagnosis Date Noted   Encounter for general adult medical examination w/o abnormal findings 08/24/2023   Abnormal glucose 08/24/2023   Primary insomnia 08/24/2023   OSA (obstructive sleep apnea) 08/24/2023   Pain due to onychomycosis of toenails of both feet 06/08/2023   Porokeratosis 06/08/2023   Stage 3a chronic kidney disease (HCC) 02/16/2023   Hypertensive heart and renal disease 02/16/2023   Aortic atherosclerosis (HCC) 02/16/2023   Degenerative lumbar spinal stenosis 03/03/2021   Lumbar radiculitis 03/03/2021   Lumbago with sciatica, right side 08/12/2020   PAD (peripheral artery disease) (HCC) 09/06/2019   Essential hypertension, benign 01/04/2019   Estrogen deficiency 01/04/2019   Class 2 severe obesity due to excess calories with serious comorbidity and body mass index (BMI) of 36.0 to 36.9 in adult [facility]) 01/04/2019   Degeneration of lumbosacral intervertebral disc 07/31/2018    Past Surgical History:  Procedure Laterality Date   ABDOMINAL HYSTERECTOMY     ESOPHAGOGASTRODUODENOSCOPY (EGD) WITH PROPOFOL N/A 01/11/2022   Procedure: ESOPHAGOGASTRODUODENOSCOPY (EGD) WITH PROPOFOL;  Surgeon: Wyline Mood, MD;  Location: [facility] ENDOSCOPY;  Service: Gastroenterology;   Laterality: N/A;   HEMORRHOID SURGERY      OB History   No obstetric history on file.      Home Medications    Prior to Admission medications   Medication Sig Start Date End Date Taking? Authorizing Provider  Acetaminophen (TYLENOL PO) Take by mouth.    [provider]  Ascorbic Acid (VITAMIN C PO) Take by mouth.    [provider]  calcium carbonate (TUMS - DOSED IN MG ELEMENTAL CALCIUM) 500 MG chewable tablet Chew 2 tablets by mouth daily.     [provider]  Cholecalciferol (VITAMIN D3 PO) 1 cap PO QD    [provider]  Ciclopirox 0.77 % gel Apply 1 Application topically 2 (two) times daily. 03/04/23   Vivi Barrack, DPM  Garlic 1000 MG CAPS Take by mouth.    [provider]  glucosamine-chondroitin 500-400 MG tablet Take 1 tablet by mouth 3 (three) times daily.    [provider]  levocetirizine (XYZAL) 5 MG tablet TAKE 1 TABLET(5 MG) BY MOUTH EVERY EVENING 10/12/23   Dorothyann Peng, MD  lisinopril (ZESTRIL) 5 MG tablet Take 1 tablet (5 mg total) by mouth daily. 07/19/23   Dorothyann Peng, MD  Multiple Vitamin (MULTIVITAMIN) tablet Take 1 tablet by mouth daily.    [provider]  Multiple Vitamins-Minerals (PRESERVISION AREDS PO) Take by mouth.    [provider]  Omega-3 Fatty Acids (FISH OIL) 1000 MG CAPS Take 1 tablet by mouth daily.  [provider]    Family History Family History  Problem Relation Age of Onset   Hypertension Mother    Throat cancer Mother    Prostate cancer Father    Tremor Father    Hypertension Sister    Diabetes Sister    Hypertension Brother    Hypertension Sister    Diabetes Sister    Hypertension Sister     Social History Social History   Tobacco Use   Smoking status: Former    Current packs/day: 0.00    Average packs/day: 1 pack/day for 20.0 years (20.0 ttl pk-yrs)    Types: Cigarettes    Start date: 89    Quit date: 2004    Years since  quitting: 21.0   Smokeless tobacco: Never   Tobacco comments:    n/a  Vaping Use   Vaping status: Never Used  Substance Use Topics   Alcohol use: No   Drug use: No     Allergies   Amoxicillin, Penicillins, and Potassium-containing compounds   Review of Systems Review of Systems  Constitutional: Negative.   HENT:  Positive for ear pain.   Respiratory: Negative.    Cardiovascular: Negative.   Gastrointestinal: Negative.   Genitourinary: Negative.   Musculoskeletal: Negative.   Hematological: Negative.   Psychiatric/Behavioral: Negative.       Physical Exam Triage Vital Signs ED Triage Vitals  Encounter Vitals Group     BP 01/03/24 1413 115/67     Systolic BP Percentile --      Diastolic BP Percentile --      Pulse Rate 01/03/24 1413 75     Resp 01/03/24 1413 16     Temp 01/03/24 1413 98.3 F (36.8 C)     Temp Source 01/03/24 1413 Oral     SpO2 01/03/24 1413 95 %     Weight 01/03/24 1412 185 lb (83.9 kg)     Height 01/03/24 1412 5' (1.524 m)     Head Circumference --      Peak Flow --      Pain Score 01/03/24 1411 6     Pain Loc --      Pain Education --      Exclude from Growth Chart --    No data found.  Updated Vital Signs BP 115/67 (BP Location: Left Arm)   Pulse 75   Temp 98.3 F (36.8 C) (Oral)   Resp 16   Ht 5' (1.524 m)   Wt 83.9 kg   SpO2 95%   BMI 36.13 kg/m   Visual Acuity Right Eye Distance:   Left Eye Distance:   Bilateral Distance:    Right Eye Near:   Left Eye Near:    Bilateral Near:     Physical Exam Constitutional:      General: She is not in acute distress.    Appearance: Normal appearance. She is normal weight. She is not ill-appearing.  HENT:     Head:     Comments: No TTP to the head, or jaw     Left Ear: A middle ear effusion is present. Tympanic membrane is not erythematous or bulging.     Nose: Nose normal.     Mouth/Throat:     Mouth: Mucous membranes are moist.  Cardiovascular:     Rate and Rhythm: Normal  rate and regular rhythm.  Pulmonary:     Effort: Pulmonary effort is normal.     Breath sounds: Normal breath sounds.  Musculoskeletal:  Cervical back: Normal range of motion and neck supple. No tenderness.  Lymphadenopathy:     Cervical: No cervical adenopathy.  Neurological:     Mental Status: She is alert.      UC Treatments / Results  Labs (all labs ordered are listed, but only abnormal results are displayed) Labs Reviewed - No data to display  EKG   Radiology No results found.  Procedures Procedures (including critical care time)  Medications Ordered in UC Medications - No data to display  Initial Impression / Assessment and Plan / UC Course  I have reviewed the triage vital signs and the nursing notes.  Pertinent labs & imaging results that were available during my care of the patient were reviewed by me and considered in my medical decision making (see chart for details).   Final Clinical Impressions(s) / UC Diagnoses   Final diagnoses:  Right ear pain  Disorder of right eustachian tube  Dizziness     Discharge Instructions      You were seen today for ear pain.  There is no evidence of infection, but some fluid behind the ear.  Please continue your xyzal that you should  already have at home, and start the 5 days of prednisone I have sent to the pharmacy.  If your symptoms continue, then please return here for follow up with your primary care provider.     ED Prescriptions     Medication Sig Dispense Auth. Provider   predniSONE (DELTASONE) 20 MG tablet Take 2 tablets (40 mg total) by mouth daily for 5 days. 10 tablet Jannifer Franklin, MD      PDMP not reviewed this encounter.   Jannifer Franklin, MD 01/03/24 [phone]

## 2024-01-03 NOTE — Discharge Instructions (Signed)
You were seen today for ear pain.  There is no evidence of infection, but some fluid behind the ear.  Please continue your xyzal that you should  already have at home, and start the 5 days of prednisone I have sent to the pharmacy.  If your symptoms continue, then please return here for follow up with your primary care provider.

## 2024-01-03 NOTE — ED Triage Notes (Signed)
Patient here today with c/o right ear pain X 1.5 weeks. She has also noticed some soreness in her right shoulder. Her balance has also been bad and she has been dizzy.

## 2024-01-10 ENCOUNTER — Ambulatory Visit (HOSPITAL_COMMUNITY): Payer: Medicare Other

## 2024-01-10 ENCOUNTER — Ambulatory Visit (INDEPENDENT_AMBULATORY_CARE_PROVIDER_SITE_OTHER): Payer: Medicare Other | Admitting: Internal Medicine

## 2024-01-10 ENCOUNTER — Encounter: Payer: Self-pay | Admitting: Internal Medicine

## 2024-01-10 VITALS — BP 130/70 | HR 74 | Temp 98.4°F | Ht 60.0 in | Wt 191.0 lb

## 2024-01-10 DIAGNOSIS — H9201 Otalgia, right ear: Secondary | ICD-10-CM

## 2024-01-10 DIAGNOSIS — I700 Atherosclerosis of aorta: Secondary | ICD-10-CM | POA: Diagnosis not present

## 2024-01-10 DIAGNOSIS — H9311 Tinnitus, right ear: Secondary | ICD-10-CM | POA: Insufficient documentation

## 2024-01-10 DIAGNOSIS — I1310 Hypertensive heart and chronic kidney disease without heart failure, with stage 1 through stage 4 chronic kidney disease, or unspecified chronic kidney disease: Secondary | ICD-10-CM | POA: Diagnosis not present

## 2024-01-10 DIAGNOSIS — Z6837 Body mass index (BMI) 37.0-37.9, adult: Secondary | ICD-10-CM

## 2024-01-10 DIAGNOSIS — E66812 Obesity, class 2: Secondary | ICD-10-CM

## 2024-01-10 DIAGNOSIS — N1831 Chronic kidney disease, stage 3a: Secondary | ICD-10-CM

## 2024-01-10 MED ORDER — NOREL AD 4-10-325 MG PO TABS: ORAL_TABLET | ORAL | Status: AC

## 2024-01-10 NOTE — Assessment & Plan Note (Addendum)
Chronic, LDL goal < 70. She should be on statin therapy. Would prefer to not add additional meds, will address at her regularly scheduled appt in March.

## 2024-01-10 NOTE — Assessment & Plan Note (Addendum)
She agrees to ENT evaluation. Pt advised that often ENT prefers Audiology exam as well. She agrees to both referrals. She was also given sample of Norel AD to use tonite.

## 2024-01-10 NOTE — Assessment & Plan Note (Signed)
Chronic, reminded to avoid NSAIDs, stay well hydrated and keep BP well controlled to decrease risk of CKD progression.

## 2024-01-10 NOTE — Assessment & Plan Note (Signed)
She is encouraged to strive for BMI less than 30 to decrease cardiac risk. Advised to aim for at least 150 minutes of exercise per week.

## 2024-01-10 NOTE — Progress Notes (Signed)
I,Victoria T Deloria Lair, CMA,acting as a Neurosurgeon for Gwynneth Aliment, MD.,have documented all relevant documentation on the behalf of Gwynneth Aliment, MD,as directed by  Gwynneth Aliment, MD while in the presence of Gwynneth Aliment, MD.  Subjective:  Patient ID: Tricia Ferguson , female    DOB: [birth year] , 89 y.o.   MRN: [id number]  Chief Complaint  Patient presents with   Otalgia    HPI  Patient presents today for further evaluation of right ear pain. She went to urgent care on 1/21 for the same issue. She was prescribed Prednisone. At the time, she was also having dizziness. This resolved with the prednisone.  She is bothered by the "whooshing" sound in her ear. She denies experiencing any hearing deficits. She states these sx started about two weeks ago. Denies recent URI. However, states she has bad allergy symptoms.  She took her last prednisone pill on Sunday.    Hypertension This is a chronic problem. The current episode started more than 1 year ago. The problem has been gradually improving since onset. The problem is controlled. Pertinent negatives include no blurred vision, chest pain, palpitations or shortness of breath. Risk factors for coronary artery disease include obesity and post-menopausal state. The current treatment provides moderate improvement.     Past Medical History:  Diagnosis Date   Chronic kidney disease    Hypertension    Peripheral vascular disease (HCC)    Vitamin D deficiency disease      Family History  Problem Relation Age of Onset   Hypertension Mother    Throat cancer Mother    Prostate cancer Father    Tremor Father    Hypertension Sister    Diabetes Sister    Hypertension Brother    Hypertension Sister    Diabetes Sister    Hypertension Sister      Current Outpatient Medications:    Acetaminophen (TYLENOL PO), Take by mouth., Disp: , Rfl:    Ascorbic Acid (VITAMIN C PO), Take by mouth., Disp: , Rfl:    calcium carbonate (TUMS -  DOSED IN MG ELEMENTAL CALCIUM) 500 MG chewable tablet, Chew 2 tablets by mouth daily. , Disp: , Rfl:    Chlorphen-PE-Acetaminophen (NOREL AD) 4-10-325 MG TABS, One tab po nightly prn, Disp: , Rfl:    Cholecalciferol (VITAMIN D3 PO), 1 cap PO QD, Disp: , Rfl:    Ciclopirox 0.77 % gel, Apply 1 Application topically 2 (two) times daily., Disp: 45 g, Rfl: 2   Garlic 1000 MG CAPS, Take by mouth., Disp: , Rfl:    glucosamine-chondroitin 500-400 MG tablet, Take 1 tablet by mouth 3 (three) times daily., Disp: , Rfl:    levocetirizine (XYZAL) 5 MG tablet, TAKE 1 TABLET(5 MG) BY MOUTH EVERY EVENING, Disp: 90 tablet, Rfl: 1   lisinopril (ZESTRIL) 5 MG tablet, Take 1 tablet (5 mg total) by mouth daily., Disp: 90 tablet, Rfl: 2   Multiple Vitamin (MULTIVITAMIN) tablet, Take 1 tablet by mouth daily., Disp: , Rfl:    Multiple Vitamins-Minerals (PRESERVISION AREDS PO), Take by mouth., Disp: , Rfl:    Omega-3 Fatty Acids (FISH OIL) 1000 MG CAPS, Take 1 tablet by mouth daily., Disp: , Rfl:    predniSONE (DELTASONE) 20 MG tablet, , Disp: , Rfl:    Allergies  Allergen Reactions   Amoxicillin Itching   Penicillins Hives   Potassium-Containing Compounds Itching     Review of Systems  Constitutional: Negative.   Eyes:  Negative for blurred  vision.  Respiratory: Negative.  Negative for shortness of breath.   Cardiovascular: Negative.  Negative for chest pain and palpitations.  Gastrointestinal: Negative.   Neurological: Negative.   Psychiatric/Behavioral: Negative.       Today's Vitals   01/10/24 0958  BP: 130/70  Pulse: 74  Temp: 98.4 F (36.9 C)  SpO2: 98%  Weight: 191 lb (86.6 kg)  Height: 5' (1.524 m)   Body mass index is 37.3 kg/m.  Wt Readings from Last 3 Encounters:  01/10/24 191 lb (86.6 kg)  01/03/24 185 lb (83.9 kg)  09/14/23 185 lb 3.2 oz (84 kg)     Objective:  Physical Exam Vitals and nursing note reviewed.  Constitutional:      Appearance: Normal appearance. She is obese.   HENT:     Head: Normocephalic and atraumatic.     Right Ear: Tympanic membrane, ear canal and external ear normal. There is no impacted cerumen.     Left Ear: Tympanic membrane, ear canal and external ear normal. There is no impacted cerumen.  Eyes:     Extraocular Movements: Extraocular movements intact.  Cardiovascular:     Rate and Rhythm: Normal rate and regular rhythm.     Heart sounds: Normal heart sounds.  Pulmonary:     Effort: Pulmonary effort is normal.     Breath sounds: Normal breath sounds.  Skin:    General: Skin is warm.  Neurological:     General: No focal deficit present.     Mental Status: She is alert.  Psychiatric:        Mood and Affect: Mood normal.        Behavior: Behavior normal.         Assessment And Plan:  Otalgia of right ear Assessment & Plan: Resolved. UC notes reviewed.   Orders: -     Ambulatory referral to ENT  Tinnitus of right ear Assessment & Plan: She agrees to ENT evaluation. Pt advised that often ENT prefers Audiology exam as well. She agrees to both referrals. She was also given sample of Norel AD to use tonite.   Orders: -     Ambulatory referral to ENT -     Ambulatory referral to Audiology  Hypertensive heart and renal disease with renal failure, stage 1 through stage 4 or unspecified chronic kidney disease, without heart failure Assessment & Plan: Chronic, controlled.  She will continue with lisinopril 5mg  daily. Reminded to follow a low sodium diet. She will f/u in four to six months.    Aortic atherosclerosis (HCC) Assessment & Plan: Chronic, LDL goal < 70. She should be on statin therapy. Would prefer to not add additional meds, will address at her regularly scheduled appt in March.      Stage 3a chronic kidney disease (HCC) Assessment & Plan: Chronic, reminded to avoid NSAIDs, stay well hydrated and keep BP well controlled to decrease risk of CKD progression.    Class 2 severe obesity due to excess calories  with serious comorbidity and body mass index (BMI) of 37.0 to 37.9 in adult [facility]) Assessment & Plan: She is encouraged to strive for BMI less than 30 to decrease cardiac risk. Advised to aim for at least 150 minutes of exercise per week.    Other orders -     Norel AD; One tab po nightly prn  She is encouraged to strive for BMI less than 30 to decrease cardiac risk. Advised to aim for at least 150 minutes of exercise per  week.    Return if symptoms worsen or fail to improve.  Patient was given opportunity to ask questions. Patient verbalized understanding of the plan and was able to repeat key elements of the plan. All questions were answered to their satisfaction.    I, Gwynneth Aliment, MD, have reviewed all documentation for this visit. The documentation on 01/10/24 for the exam, diagnosis, procedures, and orders are all accurate and complete.   IF YOU HAVE BEEN REFERRED TO A SPECIALIST, IT MAY TAKE 1-2 WEEKS TO SCHEDULE/PROCESS THE REFERRAL. IF YOU HAVE NOT HEARD FROM US/SPECIALIST IN TWO WEEKS, PLEASE GIVE [place] A CALL AT [phone].   THE PATIENT IS ENCOURAGED TO PRACTICE SOCIAL DISTANCING DUE TO THE COVID-19 PANDEMIC.

## 2024-01-10 NOTE — Assessment & Plan Note (Signed)
Resolved. UC notes reviewed.

## 2024-01-10 NOTE — Patient Instructions (Signed)
Tinnitus Tinnitus is when you hear a sound that there's no actual source for. It may sound like ringing in your ears or something else. The sound may be loud, soft, or somewhere in between. It can last for a few seconds or be constant for days. It can come and go. Almost everyone has tinnitus at some point. It's not the same as hearing loss. But you may need to see a health care provider if: It lasts for a long time. It comes back often. You have trouble sleeping and focusing. What are the causes? The cause of tinnitus is often unknown. In some cases, you may get it if: You're around loud noises, such as from machines or music. An object gets stuck in your ear. Earwax builds up in Landscape architect. You drink a lot of alcohol or caffeine. You take certain medicines. You start to lose your hearing. You may also get it from some medical conditions. These may include: Ear or sinus infections. Heart diseases. High blood pressure. Allergies. Mnire's disease. Problems with your thyroid. A tumor. This is a growth of cells that isn't normal. A weak, bulging blood vessel called an aneurysm near your ear. What increases the risk? You may be more likely to get tinnitus if: You're around loud noises a lot. You're older. You drink alcohol. You smoke. What are the signs or symptoms? The main symptom is hearing a sound that there's no source for. It may sound like ringing. It may also sound like: Buzzing. Sizzling. Blowing air. Hissing. Whistling. Other sounds may include: Roaring. Running water. A musical note. Tapping. Humming. You may have symptoms in one ear or both ears. How is this diagnosed? Tinnitus is diagnosed based on your symptoms, your medical history, and an exam. Your provider may do a full hearing test if your tinnitus: Is in just one ear. Makes it hard for you to hear. Lasts 6 months or longer. You may also need to see an expert in hearing disorders called an audiologist.  They may ask you about your symptoms and how tinnitus affects your daily life. You may have other tests done. These may include: A CT scan. An MRI. An angiogram. This shows how blood flows through your blood vessels. How is this treated? Treatment may include: Therapy to help you manage the stress of living with tinnitus. Finding ways to mask or cover the sound of tinnitus. These include: Sound or white noise machines. Devices that fit in your ear and play sounds or music. A loud humidifier. Acoustic neural stimulation. This is when you use headphones to listen to music that has a special signal in it. Over time, this signal may change some of the pathways in your brain. This can make you less sensitive to tinnitus. This treatment is used for very severe cases. Using hearing aids or cochlear implants if your tinnitus is from hearing loss. If your tinnitus is caused by a medical condition, treating the condition may make it go away.  Follow these instructions at home: Managing symptoms     Try to avoid being in loud places or around loud noises. Wear earplugs or headphones when you're around loud noises. Find ways to reduce stress. These may include meditation, yoga, or deep breathing. Sleep with your head slightly raised. General instructions Take over-the-counter and prescription medicines only as told by your provider. Track the things that cause symptoms (triggers). Try to avoid these things. To stop your tinnitus from getting worse: Do not drink alcohol. Do  not have caffeine. Do not use any products that contain nicotine or tobacco. These products include cigarettes, chewing tobacco, and vaping devices, such as e-cigarettes. If you need help quitting, ask your provider. Avoid using too much salt. Get enough sleep each night. Where to find more information American Tinnitus Association: https://www.johnson-hamilton.org/ Contact a health care provider if: Your symptoms last for 3 weeks or longer without  stopping. You have sudden hearing loss. Your symptoms get worse or don't get better with home care. You can't manage the stress of living with tinnitus. Get help right away if: You get tinnitus after a head injury. You have tinnitus and: Dizziness. Nausea and vomiting. Loss of balance. A sudden, severe headache. Changes to your eyesight. Weakness in your face, arms, or legs. These symptoms may be an emergency. Get help right away. Call 911. Do not wait to see if the symptoms will go away. Do not drive yourself to the hospital. This information is not intended to replace advice given to you by your health care provider. Make sure you discuss any questions you have with your health care provider. Document Revised: 03/07/2023 Document Reviewed: 03/07/2023 Elsevier Patient Education  2024 [employer].

## 2024-01-10 NOTE — Assessment & Plan Note (Signed)
Chronic, controlled.  She will continue with lisinopril 5mg  daily. Reminded to follow a low sodium diet. She will f/u in four to six months.

## 2024-01-25 ENCOUNTER — Ambulatory Visit (HOSPITAL_COMMUNITY)
Admission: EM | Admit: 2024-01-25 | Discharge: 2024-01-25 | Disposition: A | Discharge status: Home / Self Care | Payer: Medicare Other

## 2024-01-25 ENCOUNTER — Encounter (HOSPITAL_COMMUNITY): Payer: Self-pay | Admitting: *Deleted

## 2024-01-25 ENCOUNTER — Other Ambulatory Visit: Payer: Self-pay

## 2024-01-25 DIAGNOSIS — R590 Localized enlarged lymph nodes: Secondary | ICD-10-CM

## 2024-01-25 DIAGNOSIS — R600 Localized edema: Secondary | ICD-10-CM

## 2024-01-25 NOTE — Discharge Instructions (Addendum)
You appear to have swelling to the right side of your neck.  Please use warm compresses and a gentle massage to help dislodge any buildup if this is a salivary stone.  You can take Tylenol 500 to 650 mg every 8 hours as needed for pain and swelling.  This is most likely due to a viral illness and should improve over the next 5 to 7 days.  Return to clinic if you develop any fever, worsening of swelling, redness or concerns for infection.  Follow-up with ENT sooner if this develops.  Follow-up with your primary care provider if no improvement over the next week or so.

## 2024-01-25 NOTE — ED Triage Notes (Signed)
PT reports last night there was a lump on the Rt side of her neck . Pt reports lump is sore to touch.

## 2024-01-25 NOTE — ED Provider Notes (Signed)
[facility]    CSN: [id number] Arrival date & time: 01/25/24  1445      History   Chief Complaint Chief Complaint  Patient presents with   Mass    HPI Tricia Ferguson is a 89 y.o. female.   Patient presents to clinic complaining of a lump to the right side of her neck.  It is sore to touch.  It popped up overnight.  She has been sick recently with nasal congestion, rhinorrhea and symptoms consistent with a viral upper respiratory tract infection.  Over the past few weeks she has been having right ear pain and does have a pending referral to ENT.  Has a appointment with a hearing specialist scheduled soon.  No fevers.  The history is provided by the patient and medical records.    Past Medical History:  Diagnosis Date   Chronic kidney disease    Hypertension    Peripheral vascular disease (HCC)    Vitamin D deficiency disease     Patient Active Problem List   Diagnosis Date Noted   Otalgia of right ear 01/10/2024   Tinnitus of right ear 01/10/2024   Encounter for general adult medical examination w/o abnormal findings 08/24/2023   Abnormal glucose 08/24/2023   Primary insomnia 08/24/2023   OSA (obstructive sleep apnea) 08/24/2023   Pain due to onychomycosis of toenails of both feet 06/08/2023   Porokeratosis 06/08/2023   Stage 3a chronic kidney disease (HCC) 02/16/2023   Hypertensive heart and renal disease 02/16/2023   Aortic atherosclerosis (HCC) 02/16/2023   Degenerative lumbar spinal stenosis 03/03/2021   Lumbar radiculitis 03/03/2021   Lumbago with sciatica, right side 08/12/2020   PAD (peripheral artery disease) (HCC) 09/06/2019   Essential hypertension, benign 01/04/2019   Estrogen deficiency 01/04/2019   Class 2 severe obesity due to excess calories with serious comorbidity and body mass index (BMI) of 37.0 to 37.9 in adult [facility]) 01/04/2019   Degeneration of lumbosacral intervertebral disc 07/31/2018    Past Surgical History:   Procedure Laterality Date   ABDOMINAL HYSTERECTOMY     ESOPHAGOGASTRODUODENOSCOPY (EGD) WITH PROPOFOL N/A 01/11/2022   Procedure: ESOPHAGOGASTRODUODENOSCOPY (EGD) WITH PROPOFOL;  Surgeon: Wyline Mood, MD;  Location: [facility] ENDOSCOPY;  Service: Gastroenterology;  Laterality: N/A;   HEMORRHOID SURGERY      OB History   No obstetric history on file.      Home Medications    Prior to Admission medications   Medication Sig Start Date End Date Taking? Authorizing Provider  Ascorbic Acid (VITAMIN C PO) Take by mouth.   Yes [provider]  calcium carbonate (TUMS - DOSED IN MG ELEMENTAL CALCIUM) 500 MG chewable tablet Chew 2 tablets by mouth daily.    Yes [provider]  Cholecalciferol (VITAMIN D3 PO) 1 cap PO QD   Yes [provider]  Garlic 1000 MG CAPS Take by mouth.   Yes [provider]  glucosamine-chondroitin 500-400 MG tablet Take 1 tablet by mouth 3 (three) times daily.   Yes [provider]  levocetirizine (XYZAL) 5 MG tablet TAKE 1 TABLET(5 MG) BY MOUTH EVERY EVENING 10/12/23  Yes Dorothyann Peng, MD  lisinopril (ZESTRIL) 5 MG tablet Take 1 tablet (5 mg total) by mouth daily. 07/19/23  Yes Dorothyann Peng, MD  Multiple Vitamin (MULTIVITAMIN) tablet Take 1 tablet by mouth daily.   Yes [provider]  Omega-3 Fatty Acids (FISH OIL) 1000 MG CAPS Take 1 tablet by mouth daily.   Yes [provider]  Acetaminophen (TYLENOL PO) Take by mouth.    [provider]  Chlorphen-PE-Acetaminophen (NOREL AD) 4-10-325 MG TABS One tab po nightly prn 01/10/24   Dorothyann Peng, MD  Ciclopirox 0.77 % gel Apply 1 Application topically 2 (two) times daily. 03/04/23   Vivi Barrack, DPM  Multiple Vitamins-Minerals (PRESERVISION AREDS PO) Take by mouth.    [provider]  predniSONE (DELTASONE) 20 MG tablet     [provider]    Family History Family History  Problem Relation Age of Onset   Hypertension Mother     Throat cancer Mother    Prostate cancer Father    Tremor Father    Hypertension Sister    Diabetes Sister    Hypertension Brother    Hypertension Sister    Diabetes Sister    Hypertension Sister     Social History Social History   Tobacco Use   Smoking status: Former    Current packs/day: 0.00    Average packs/day: 1 pack/day for 20.0 years (20.0 ttl pk-yrs)    Types: Cigarettes    Start date: 58    Quit date: 2004    Years since quitting: 21.1   Smokeless tobacco: Never   Tobacco comments:    n/a  Vaping Use   Vaping status: Never Used  Substance Use Topics   Alcohol use: No   Drug use: No     Allergies   Amoxicillin, Penicillins, and Potassium-containing compounds   Review of Systems Review of Systems  Per HPI   Physical Exam Triage Vital Signs ED Triage Vitals  Encounter Vitals Group     BP 01/25/24 1621 (!) 166/68     Systolic BP Percentile --      Diastolic BP Percentile --      Pulse Rate 01/25/24 1621 74     Resp 01/25/24 1621 20     Temp 01/25/24 1621 98.8 F (37.1 C)     Temp src --      SpO2 01/25/24 1621 94 %     Weight --      Height --      Head Circumference --      Peak Flow --      Pain Score 01/25/24 1618 6     Pain Loc --      Pain Education --      Exclude from Growth Chart --    No data found.  Updated Vital Signs BP (!) 166/68   Pulse 74   Temp 98.8 F (37.1 C)   Resp 20   SpO2 94%   Visual Acuity Right Eye Distance:   Left Eye Distance:   Bilateral Distance:    Right Eye Near:   Left Eye Near:    Bilateral Near:     Physical Exam Vitals and nursing note reviewed.  Constitutional:      Appearance: Normal appearance.  HENT:     Head: Normocephalic and atraumatic.      Comments: Swelling of the submandibular lymph nodes of the right side.  Overlying skin is without erythema, warmth or tenderness.  Lymph node is round and mobile.    Right Ear: External ear normal.     Left Ear: External ear normal.      Nose: Nose normal.     Mouth/Throat:     Mouth: Mucous membranes are moist.  Eyes:     Conjunctiva/sclera: Conjunctivae normal.  Cardiovascular:     Rate and Rhythm: Normal rate.  Pulmonary:  Effort: Pulmonary effort is normal. No respiratory distress.  Musculoskeletal:        General: Normal range of motion.  Lymphadenopathy:     Cervical: Cervical adenopathy present.  Skin:    General: Skin is warm and dry.  Neurological:     General: No focal deficit present.     Mental Status: She is alert and oriented to person, place, and time.  Psychiatric:        Mood and Affect: Mood normal.        Behavior: Behavior normal. Behavior is cooperative.      UC Treatments / Results  Labs (all labs ordered are listed, but only abnormal results are displayed) Labs Reviewed - No data to display  EKG   Radiology No results found.  Procedures Procedures (including critical care time)  Medications Ordered in UC Medications - No data to display  Initial Impression / Assessment and Plan / UC Course  I have reviewed the triage vital signs and the nursing notes.  Pertinent labs & imaging results that were available during my care of the patient were reviewed by me and considered in my medical decision making (see chart for details).  Vitals and triage reviewed, patient is hemodynamically stable.  Appears to have salivary gland swelling of the right side, most likely submandibular.  Could be inflamed due to recent viral URI or salivary stone.  Symptomatic management with warm compresses and gentle massage discussed.  Most likely viral, will withhold antibiotics at this time.  Strict emergency and return precautions given if symptoms evolve or worsen, patient verbalized understanding, no questions at this time.     Final Clinical Impressions(s) / UC Diagnoses   Final diagnoses:  Submandibular lymphadenopathy  Salivary gland swelling     Discharge Instructions      You  appear to have swelling to the right side of your neck.  Please use warm compresses and a gentle massage to help dislodge any buildup if this is a salivary stone.  You can take Tylenol 500 to 650 mg every 8 hours as needed for pain and swelling.  This is most likely due to a viral illness and should improve over the next 5 to 7 days.  Return to clinic if you develop any fever, worsening of swelling, redness or concerns for infection.  Follow-up with ENT sooner if this develops.  Follow-up with your primary care provider if no improvement over the next week or so.    ED Prescriptions   None    PDMP not reviewed this encounter.   Adrianah Prophete, [place], [place] 01/25/24 [phone]

## 2024-02-02 ENCOUNTER — Ambulatory Visit: Payer: Medicare Other | Admitting: Audiologist

## 2024-02-06 DIAGNOSIS — H353221 Exudative age-related macular degeneration, left eye, with active choroidal neovascularization: Secondary | ICD-10-CM | POA: Diagnosis not present

## 2024-02-16 ENCOUNTER — Ambulatory Visit: Payer: Medicare Other | Attending: Internal Medicine | Admitting: Audiologist

## 2024-02-16 DIAGNOSIS — H9041 Sensorineural hearing loss, unilateral, right ear, with unrestricted hearing on the contralateral side: Secondary | ICD-10-CM | POA: Insufficient documentation

## 2024-02-16 DIAGNOSIS — H9311 Tinnitus, right ear: Secondary | ICD-10-CM | POA: Insufficient documentation

## 2024-02-16 NOTE — Procedures (Signed)
  Outpatient Audiology and [facility] [address] [place], [place]  [address] [phone]  AUDIOLOGICAL  EVALUATION  NAME: Tricia Ferguson     DOB:   15-Apr-1934      MRN: [id number]                                                                                     DATE: 02/16/2024     REFERENT: Dorothyann Peng, MD STATUS: Outpatient DIAGNOSIS: Right Ear Hearing Loss, Normal Left Ear   History: Cyndel was seen for an audiological evaluation due to a /wwooo/ echoing sound in the right ear. It onset suddenly a month ago. She saw urgent care and was told by the doctor she had tinnitus. Malia feels she ears well from the left ear. She can hear from the right but its muffled. Islah denies pain or pressure in either ear. The tinnitus started when she had a cold. It is not bothersome.  Ladon has history of hazardous noise exposure from work.  Medical history shows history of diabetes which is a risk for hearing loss.   Evaluation:  Otoscopy showed a clear view of the tympanic membranes, bilaterally Tympanometry results were consistent with normal middle ear function, bilaterally   Audiometric testing was completed using Conventional Audiometry techniques with insert earphones and supraural headphones. Test results are consistent with normal hearing in the left ear and a moderate sensorineural low frequency hearing loss in the right ear rising to normal hearing by 1kHz. Speech Recognition Thresholds were obtained at  15dB HL in the right ear and at 10dB HL in the left ear. Word Recognition Testing was completed at  40dB SL and Malary scored 100% in each ear.    Results:  The test results were reviewed with Nicole Cella and her daughter. Shiree has essentially normal hearing in the left ear. Right ear shows a low pitch hearing loss. She does not need hearing aids. She needs to see Otolaryngology for evaluation of the right ear hearing loss and sudden onset of tinnitus.   Audiogram printed and provided to Geisha.   Recommendations: Recommend referral to Otolaryngology due to asymmetric hearing loss in right ear with sudden onset and tinnitus Follow up scheduled in June to monitor right ear hearing loss for progression   36 minutes spent testing and counseling on results.   If you have any questions please feel free to contact me at [phone].  Brendia Sacks BS Audiology Student  Roquel Burgin Au.D.  Audiologist   02/16/2024  4:17 PM  Cc: Dorothyann Peng, MD

## 2024-02-20 ENCOUNTER — Ambulatory Visit: Admitting: Internal Medicine

## 2024-02-20 ENCOUNTER — Encounter: Payer: Self-pay | Admitting: Internal Medicine

## 2024-02-20 VITALS — BP 128/70 | HR 73 | Temp 98.7°F | Ht 60.0 in | Wt 188.8 lb

## 2024-02-20 DIAGNOSIS — H9311 Tinnitus, right ear: Secondary | ICD-10-CM

## 2024-02-20 DIAGNOSIS — I700 Atherosclerosis of aorta: Secondary | ICD-10-CM

## 2024-02-20 DIAGNOSIS — N1831 Chronic kidney disease, stage 3a: Secondary | ICD-10-CM | POA: Diagnosis not present

## 2024-02-20 DIAGNOSIS — E66812 Obesity, class 2: Secondary | ICD-10-CM

## 2024-02-20 DIAGNOSIS — M1711 Unilateral primary osteoarthritis, right knee: Secondary | ICD-10-CM

## 2024-02-20 DIAGNOSIS — H6121 Impacted cerumen, right ear: Secondary | ICD-10-CM

## 2024-02-20 DIAGNOSIS — D649 Anemia, unspecified: Secondary | ICD-10-CM | POA: Diagnosis not present

## 2024-02-20 DIAGNOSIS — R040 Epistaxis: Secondary | ICD-10-CM

## 2024-02-20 DIAGNOSIS — I1310 Hypertensive heart and chronic kidney disease without heart failure, with stage 1 through stage 4 chronic kidney disease, or unspecified chronic kidney disease: Secondary | ICD-10-CM | POA: Diagnosis not present

## 2024-02-20 DIAGNOSIS — Z6836 Body mass index (BMI) 36.0-36.9, adult: Secondary | ICD-10-CM

## 2024-02-20 NOTE — Patient Instructions (Signed)
 Hypertension, Adult Hypertension is another name for high blood pressure. High blood pressure forces your heart to work harder to pump blood. This can cause problems over time. There are two numbers in a blood pressure reading. There is a top number (systolic) over a bottom number (diastolic). It is best to have a blood pressure that is below 120/80. What are the causes? The cause of this condition is not known. Some other conditions can lead to high blood pressure. What increases the risk? Some lifestyle factors can make you more likely to develop high blood pressure: Smoking. Not getting enough exercise or physical activity. Being overweight. Having too much fat, sugar, calories, or salt (sodium) in your diet. Drinking too much alcohol. Other risk factors include: Having any of these conditions: Heart disease. Diabetes. High cholesterol. Kidney disease. Obstructive sleep apnea. Having a family history of high blood pressure and high cholesterol. Age. The risk increases with age. Stress. What are the signs or symptoms? High blood pressure may not cause symptoms. Very high blood pressure (hypertensive crisis) may cause: Headache. Fast or uneven heartbeats (palpitations). Shortness of breath. Nosebleed. Vomiting or feeling like you may vomit (nauseous). Changes in how you see. Very bad chest pain. Feeling dizzy. Seizures. How is this treated? This condition is treated by making healthy lifestyle changes, such as: Eating healthy foods. Exercising more. Drinking less alcohol. Your doctor may prescribe medicine if lifestyle changes do not help enough and if: Your top number is above 130. Your bottom number is above 80. Your personal target blood pressure may vary. Follow these instructions at home: Eating and drinking  If told, follow the DASH eating plan. To follow this plan: Fill one half of your plate at each meal with fruits and vegetables. Fill one fourth of your plate  at each meal with whole grains. Whole grains include whole-wheat pasta, brown rice, and whole-grain bread. Eat or drink low-fat dairy products, such as skim milk or low-fat yogurt. Fill one fourth of your plate at each meal with low-fat (lean) proteins. Low-fat proteins include fish, chicken without skin, eggs, beans, and tofu. Avoid fatty meat, cured and processed meat, or chicken with skin. Avoid pre-made or processed food. Limit the amount of salt in your diet to less than 1,500 mg each day. Do not drink alcohol if: Your doctor tells you not to drink. You are pregnant, may be pregnant, or are planning to become pregnant. If you drink alcohol: Limit how much you have to: 0-1 drink a day for women. 0-2 drinks a day for men. Know how much alcohol is in your drink. In the [place] one drink equals one 12 oz bottle of beer (355 mL), one 5 oz glass of wine (148 mL), or one 1 oz glass of hard liquor (44 mL). Lifestyle  Work with your doctor to stay at a healthy weight or to lose weight. Ask your doctor what the best weight is for you. Get at least 30 minutes of exercise that causes your heart to beat faster (aerobic exercise) most days of the week. This may include walking, swimming, or biking. Get at least 30 minutes of exercise that strengthens your muscles (resistance exercise) at least 3 days a week. This may include lifting weights or doing Pilates. Do not smoke or use any products that contain nicotine or tobacco. If you need help quitting, ask your doctor. Check your blood pressure at home as told by your doctor. Keep all follow-up visits. Medicines Take over-the-counter and prescription medicines  only as told by your doctor. Follow directions carefully. Do not skip doses of blood pressure medicine. The medicine does not work as well if you skip doses. Skipping doses also puts you at risk for problems. Ask your doctor about side effects or reactions to medicines that you should watch  for. Contact a doctor if: You think you are having a reaction to the medicine you are taking. You have headaches that keep coming back. You feel dizzy. You have swelling in your ankles. You have trouble with your vision. Get help right away if: You get a very bad headache. You start to feel mixed up (confused). You feel weak or numb. You feel faint. You have very bad pain in your: Chest. Belly (abdomen). You vomit more than once. You have trouble breathing. These symptoms may be an emergency. Get help right away. Call 911. Do not wait to see if the symptoms will go away. Do not drive yourself to the hospital. Summary Hypertension is another name for high blood pressure. High blood pressure forces your heart to work harder to pump blood. For most people, a normal blood pressure is less than 120/80. Making healthy choices can help lower blood pressure. If your blood pressure does not get lower with healthy choices, you may need to take medicine. This information is not intended to replace advice given to you by your health care provider. Make sure you discuss any questions you have with your health care provider. Document Revised: 09/17/2021 Document Reviewed: 09/17/2021 Elsevier Patient Education  2024 [employer].

## 2024-02-20 NOTE — Progress Notes (Unsigned)
 I,Victoria T Deloria Lair, CMA,acting as a Neurosurgeon for Tricia Aliment, MD.,have documented all relevant documentation on the behalf of Tricia Aliment, MD,as directed by  Tricia Aliment, MD while in the presence of Tricia Aliment, MD.  Subjective:  Patient ID: Tricia Ferguson , female    DOB: Sep 16, 1934 , 89 y.o.   MRN: [id number]  Chief Complaint  Patient presents with   Hypertension    HPI  She is here today for BP check.  She reports compliance with meds.  She denies headache, chest pain, SOB.  She complains of lots of nasal congestion. Along with bleeding.  She also complains of right ear pain. Described as a sharp pain. Also with ringing in this ear. Not sure what precipitates her sx.  She would also like a recommendation for a knee doctor. For pain management.     Hypertension This is a chronic problem. The current episode started more than 1 year ago. The problem has been gradually improving since onset. The problem is controlled. Pertinent negatives include no blurred vision, chest pain, palpitations or shortness of breath. Risk factors for coronary artery disease include obesity and post-menopausal state. The current treatment provides moderate improvement.     Past Medical History:  Diagnosis Date   Chronic kidney disease    Hypertension    Peripheral vascular disease (HCC)    Vitamin D deficiency disease      Family History  Problem Relation Age of Onset   Hypertension Mother    Throat cancer Mother    Prostate cancer Father    Tremor Father    Hypertension Sister    Diabetes Sister    Hypertension Brother    Hypertension Sister    Diabetes Sister    Hypertension Sister      Current Outpatient Medications:    Acetaminophen (TYLENOL PO), Take by mouth., Disp: , Rfl:    Ascorbic Acid (VITAMIN C PO), Take by mouth., Disp: , Rfl:    calcium carbonate (TUMS - DOSED IN MG ELEMENTAL CALCIUM) 500 MG chewable tablet, Chew 2 tablets by mouth daily. , Disp: ,  Rfl:    Chlorphen-PE-Acetaminophen (NOREL AD) 4-10-325 MG TABS, One tab po nightly prn, Disp: , Rfl:    Cholecalciferol (VITAMIN D3 PO), 1 cap PO QD, Disp: , Rfl:    Ciclopirox 0.77 % gel, Apply 1 Application topically 2 (two) times daily., Disp: 45 g, Rfl: 2   Garlic 1000 MG CAPS, Take by mouth., Disp: , Rfl:    glucosamine-chondroitin 500-400 MG tablet, Take 1 tablet by mouth 3 (three) times daily., Disp: , Rfl:    levocetirizine (XYZAL) 5 MG tablet, TAKE 1 TABLET(5 MG) BY MOUTH EVERY EVENING, Disp: 90 tablet, Rfl: 1   lisinopril (ZESTRIL) 5 MG tablet, Take 1 tablet (5 mg total) by mouth daily., Disp: 90 tablet, Rfl: 2   Multiple Vitamin (MULTIVITAMIN) tablet, Take 1 tablet by mouth daily., Disp: , Rfl:    Multiple Vitamins-Minerals (PRESERVISION AREDS PO), Take by mouth., Disp: , Rfl:    Omega-3 Fatty Acids (FISH OIL) 1000 MG CAPS, Take 1 tablet by mouth daily., Disp: , Rfl:    predniSONE (DELTASONE) 20 MG tablet, , Disp: , Rfl:    Allergies  Allergen Reactions   Amoxicillin Itching   Penicillins Hives   Potassium-Containing Compounds Itching     Review of Systems  Constitutional: Negative.   Eyes:  Negative for blurred vision.  Respiratory: Negative.  Negative for shortness of breath.  Cardiovascular: Negative.  Negative for chest pain and palpitations.  Gastrointestinal: Negative.   Neurological: Negative.   Psychiatric/Behavioral: Negative.       Today's Vitals   02/20/24 1435  BP: 128/70  Pulse: 73  Temp: 98.7 F (37.1 C)  SpO2: 98%  Weight: 188 lb 12.8 oz (85.6 kg)  Height: 5' (1.524 m)   Body mass index is 36.87 kg/m.  Wt Readings from Last 3 Encounters:  02/20/24 188 lb 12.8 oz (85.6 kg)  01/10/24 191 lb (86.6 kg)  01/03/24 185 lb (83.9 kg)     Objective:  Physical Exam Vitals and nursing note reviewed.  Constitutional:      Appearance: Normal appearance. She is obese.  HENT:     Head: Normocephalic and atraumatic.  Eyes:     Extraocular Movements:  Extraocular movements intact.  Cardiovascular:     Rate and Rhythm: Normal rate and regular rhythm.     Heart sounds: Normal heart sounds.  Pulmonary:     Effort: Pulmonary effort is normal.     Breath sounds: Normal breath sounds.  Musculoskeletal:     Cervical back: Normal range of motion.     Comments: Ambulatory with cane  Skin:    General: Skin is warm.  Neurological:     General: No focal deficit present.     Mental Status: She is alert.  Psychiatric:        Mood and Affect: Mood normal.        Behavior: Behavior normal.         Assessment And Plan:  Hypertensive heart and renal disease with renal failure, stage 1 through stage 4 or unspecified chronic kidney disease, without heart failure -     CBC -     CMP14+EGFR  Aortic atherosclerosis (HCC)  Stage 3a chronic kidney disease (HCC) -     CMP14+EGFR  Tinnitus of right ear  Epistaxis  Localized osteoarthritis of right knee -     Ambulatory referral to Orthopedic Surgery  Class 2 severe obesity due to excess calories with serious comorbidity and body mass index (BMI) of 36.0 to 36.9 in adult [facility])  Impacted cerumen of right ear  She is encouraged to strive for BMI less than 30 to decrease cardiac risk. Advised to aim for at least 150 minutes of exercise per week.    Return if symptoms worsen or fail to improve.  Patient was given opportunity to ask questions. Patient verbalized understanding of the plan and was able to repeat key elements of the plan. All questions were answered to their satisfaction.    I, Tricia Aliment, MD, have reviewed all documentation for this visit. The documentation on 02/20/24 for the exam, diagnosis, procedures, and orders are all accurate and complete.   IF YOU HAVE BEEN REFERRED TO A SPECIALIST, IT MAY TAKE 1-2 WEEKS TO SCHEDULE/PROCESS THE REFERRAL. IF YOU HAVE NOT HEARD FROM US/SPECIALIST IN TWO WEEKS, PLEASE GIVE [place] A CALL AT [phone].   THE PATIENT IS ENCOURAGED  TO PRACTICE SOCIAL DISTANCING DUE TO THE COVID-19 PANDEMIC.

## 2024-02-21 DIAGNOSIS — H6121 Impacted cerumen, right ear: Secondary | ICD-10-CM | POA: Insufficient documentation

## 2024-02-21 LAB — CMP14+EGFR
ALT: 15 IU/L (ref 0–32)
AST: 19 IU/L (ref 0–40)
Albumin: 3.9 g/dL (ref 3.7–4.7)
Alkaline Phosphatase: 88 IU/L (ref 44–121)
BUN/Creatinine Ratio: 30 — ABNORMAL HIGH (ref 12–28)
BUN: 29 mg/dL — ABNORMAL HIGH (ref 8–27)
Bilirubin Total: 0.3 mg/dL (ref 0.0–1.2)
CO2: 24 mmol/L (ref 20–29)
Calcium: 9.3 mg/dL (ref 8.7–10.3)
Chloride: 105 mmol/L (ref 96–106)
Creatinine, Ser: 0.96 mg/dL (ref 0.57–1.00)
Globulin, Total: 2.4 g/dL (ref 1.5–4.5)
Glucose: 67 mg/dL — ABNORMAL LOW (ref 70–99)
Potassium: 4.3 mmol/L (ref 3.5–5.2)
Sodium: 140 mmol/L (ref 134–144)
Total Protein: 6.3 g/dL (ref 6.0–8.5)
eGFR: 57 mL/min/{1.73_m2} — ABNORMAL LOW (ref >59)

## 2024-02-21 LAB — CBC
Hematocrit: 35.2 % (ref 34.0–46.6)
Hemoglobin: 11.2 g/dL (ref 11.1–15.9)
MCH: 30.2 pg (ref 26.6–33.0)
MCHC: 31.8 g/dL (ref 31.5–35.7)
MCV: 95 fL (ref 79–97)
Platelets: 222 10*3/uL (ref 150–450)
RBC: 3.71 x10E6/uL — ABNORMAL LOW (ref 3.77–5.28)
RDW: 12.2 % (ref 11.7–15.4)
WBC: 9.9 10*3/uL (ref 3.4–10.8)

## 2024-02-21 NOTE — Assessment & Plan Note (Signed)
 Chronic, reminded to avoid NSAIDs, stay well hydrated and keep BP well controlled to decrease risk of CKD progression.

## 2024-02-21 NOTE — Assessment & Plan Note (Signed)
 She is advised she may benefit from a humidifier in her bedroom. She may consider use of Afrin in affected nose during acute symptoms. She agrees to ENT referral.

## 2024-02-21 NOTE — Assessment & Plan Note (Signed)
 Chronic, controlled.  She will continue with lisinopril 5mg  daily. Reminded to follow a low sodium diet. She will f/u in four to six months.

## 2024-02-21 NOTE — Assessment & Plan Note (Signed)
 Persistent. She has had audiology evaluation. They recommend further evaluation by ENT. She has upcoming appt within the next week.

## 2024-02-21 NOTE — Assessment & Plan Note (Signed)
 She is encouraged to strive for BMI less than 30 to decrease cardiac risk. Advised to aim for at least 150 minutes of exercise per week.

## 2024-02-21 NOTE — Assessment & Plan Note (Addendum)
 Chronic, LDL goal < 70. She should be on statin therapy. She still does not wish to add additional meds.  Importance of dietary compliance was stressed to the patient. Encouraged to incorporate more exercise into her daily routine.

## 2024-02-21 NOTE — Assessment & Plan Note (Signed)
 I declined to irrigate today, she has upcoming ENT evaluation.

## 2024-02-21 NOTE — Assessment & Plan Note (Signed)
 Chronic, currently symptomatic. I will refer her to Ortho for radiographic studies and further evaluation. She is in agreement with treatment plan. Advised to consider topical pain cream, like Voltaren gel, to apply to affected area two to three times per day.

## 2024-02-22 ENCOUNTER — Ambulatory Visit: Payer: Medicare Other | Admitting: Internal Medicine

## 2024-02-23 LAB — IRON AND TIBC
Iron Saturation: 33 % (ref 15–55)
Iron: 85 ug/dL (ref 27–139)
Total Iron Binding Capacity: 257 ug/dL (ref 250–450)
UIBC: 172 ug/dL (ref 118–369)

## 2024-02-23 LAB — FERRITIN: Ferritin: 144 ng/mL (ref 15–150)

## 2024-02-23 LAB — SPECIMEN STATUS REPORT

## 2024-02-27 ENCOUNTER — Telehealth (INDEPENDENT_AMBULATORY_CARE_PROVIDER_SITE_OTHER): Payer: Self-pay | Admitting: Otolaryngology

## 2024-02-27 NOTE — Telephone Encounter (Signed)
 Confirmed appt & location [id number] afm

## 2024-02-28 ENCOUNTER — Ambulatory Visit (INDEPENDENT_AMBULATORY_CARE_PROVIDER_SITE_OTHER): Payer: Medicare Other

## 2024-02-28 ENCOUNTER — Encounter (INDEPENDENT_AMBULATORY_CARE_PROVIDER_SITE_OTHER): Payer: Self-pay

## 2024-02-28 VITALS — BP 147/68 | HR 68 | Ht 60.0 in | Wt 183.0 lb

## 2024-02-28 DIAGNOSIS — H9311 Tinnitus, right ear: Secondary | ICD-10-CM

## 2024-02-28 DIAGNOSIS — H9201 Otalgia, right ear: Secondary | ICD-10-CM

## 2024-02-28 DIAGNOSIS — H6121 Impacted cerumen, right ear: Secondary | ICD-10-CM

## 2024-02-29 NOTE — Progress Notes (Signed)
 Patient ID: Tricia Ferguson, female   DOB: [birth year], 89 y.o.   MRN: [id number]  CC: Right ear pain, right ear tinnitus  HPI:  Tricia Ferguson is a 89 y.o. female who presents today complaining of intermittent right ear pain for the past 2 months.  She also complains of fluctuating right ear tinnitus.  She describes her tinnitus as an intermittent roaring noise.  She has no recent otitis media or otitis externa.  She denies any previous otologic surgery.  According to the patient, her hearing was tested by an audiologist 2 weeks ago.  No significant hearing loss was noted.  The patient quit the use of tobacco 10 years ago.  She denies any sore throat, dysphagia, odynophagia, dysphonia, or dyspnea.  Past Medical History:  Diagnosis Date   Chronic kidney disease    Hypertension    Peripheral vascular disease (HCC)    Vitamin D deficiency disease     Past Surgical History:  Procedure Laterality Date   ABDOMINAL HYSTERECTOMY     ESOPHAGOGASTRODUODENOSCOPY (EGD) WITH PROPOFOL N/A 01/11/2022   Procedure: ESOPHAGOGASTRODUODENOSCOPY (EGD) WITH PROPOFOL;  Surgeon: Wyline Mood, MD;  Location: [facility] ENDOSCOPY;  Service: Gastroenterology;  Laterality: N/A;   HEMORRHOID SURGERY      Family History  Problem Relation Age of Onset   Hypertension Mother    Throat cancer Mother    Prostate cancer Father    Tremor Father    Hypertension Sister    Diabetes Sister    Hypertension Brother    Hypertension Sister    Diabetes Sister    Hypertension Sister     Social History:  reports that she quit smoking about 21 years ago. Her smoking use included cigarettes. She started smoking about 41 years ago. She has a 20 pack-year smoking history. She has never used smokeless tobacco. She reports that she does not drink alcohol and does not use drugs.  Allergies:  Allergies  Allergen Reactions   Amoxicillin Itching   Penicillins Hives   Potassium-Containing Compounds Itching    Prior  to Admission medications   Medication Sig Start Date End Date Taking? Authorizing Provider  Acetaminophen (TYLENOL PO) Take by mouth.   Yes [provider]  Ascorbic Acid (VITAMIN C PO) Take by mouth.   Yes [provider]  calcium carbonate (TUMS - DOSED IN MG ELEMENTAL CALCIUM) 500 MG chewable tablet Chew 2 tablets by mouth daily.    Yes [provider]  Chlorphen-PE-Acetaminophen (NOREL AD) 4-10-325 MG TABS One tab po nightly prn 01/10/24  Yes Dorothyann Peng, MD  Cholecalciferol (VITAMIN D3 PO) 1 cap PO QD   Yes [provider]  Ciclopirox 0.77 % gel Apply 1 Application topically 2 (two) times daily. 03/04/23  Yes Vivi Barrack, DPM  Garlic 1000 MG CAPS Take by mouth.   Yes [provider]  glucosamine-chondroitin 500-400 MG tablet Take 1 tablet by mouth 3 (three) times daily.   Yes [provider]  levocetirizine (XYZAL) 5 MG tablet TAKE 1 TABLET(5 MG) BY MOUTH EVERY EVENING 10/12/23  Yes Dorothyann Peng, MD  lisinopril (ZESTRIL) 5 MG tablet Take 1 tablet (5 mg total) by mouth daily. 07/19/23  Yes Dorothyann Peng, MD  Multiple Vitamin (MULTIVITAMIN) tablet Take 1 tablet by mouth daily.   Yes [provider]  Multiple Vitamins-Minerals (PRESERVISION AREDS PO) Take by mouth.   Yes [provider]  Omega-3 Fatty Acids (FISH OIL) 1000 MG CAPS Take 1 tablet by mouth daily.   Yes  [provider]  predniSONE (DELTASONE) 20 MG tablet    Yes [provider]    Blood pressure (!) 147/68, pulse 68, height 5' (1.524 m), weight 183 lb (83 kg), SpO2 95%. Exam: General: Communicates without difficulty, well nourished, no acute distress. Head: Normocephalic, no evidence injury, no tenderness, facial buttresses intact without stepoff. Face/sinus: No tenderness to palpation and percussion. Facial movement is normal and symmetric. Eyes: PERRL, EOMI. No scleral icterus, conjunctivae clear. Neuro: CN II exam reveals vision  grossly intact.  No nystagmus at any point of gaze. Ears: Auricles well formed without lesions.  Right ear cerumen impaction.  The left ear canal and tympanic membrane are normal.  Nose: External evaluation reveals normal support and skin without lesions.  Dorsum is intact.  Anterior rhinoscopy reveals congested mucosa over anterior aspect of inferior turbinates and intact septum.  No purulence noted. Oral:  Oral cavity and oropharynx are intact, symmetric, without erythema or edema.  Mucosa is moist without lesions. Neck: Full range of motion without pain.  There is no significant lymphadenopathy.  No masses palpable.  Thyroid bed within normal limits to palpation.  Parotid glands and submandibular glands equal bilaterally without mass.  Trachea is midline. Neuro:  CN 2-12 grossly intact.   Procedure: Right ear cerumen disimpaction Anesthesia: None Description: Under the operating microscope, the cerumen is carefully removed with a combination of cerumen currette, alligator forceps, and suction catheters.  After the cerumen is removed, the TMs are noted to be normal.  No mass, erythema, or lesions. The patient tolerated the procedure well.    Assessment: 1.  Incidental finding of right ear cerumen impaction.  After the disimpaction procedure, both tympanic membranes and middle ear spaces are noted to be normal. 2.  Subjective right ear tinnitus. 3.  According to the patient, her hearing test was normal 2 weeks ago. 4.  Referred right otalgia.  No otologic abnormality is noted today.  Plan: 1.  Otomicroscopy with right ear cerumen disimpaction. 2.  The strategies to cope with tinnitus, including the use of masker, hearing aids, tinnitus retraining therapy, and avoidance of caffeine and alcohol are discussed. 3.  The patient is reassured that no infection is noted today. 4.  NSAIDs as needed to treat her referred otalgia. 5.  The patient is encouraged to call with any questions or concerns.  Song Myre W  Malaijah Houchen 02/29/2024, 10:58 AM

## 2024-03-21 ENCOUNTER — Telehealth: Payer: Self-pay

## 2024-03-21 ENCOUNTER — Other Ambulatory Visit (INDEPENDENT_AMBULATORY_CARE_PROVIDER_SITE_OTHER): Payer: Self-pay

## 2024-03-21 ENCOUNTER — Ambulatory Visit: Admitting: Orthopaedic Surgery

## 2024-03-21 DIAGNOSIS — M25561 Pain in right knee: Secondary | ICD-10-CM

## 2024-03-21 DIAGNOSIS — G8929 Other chronic pain: Secondary | ICD-10-CM | POA: Diagnosis not present

## 2024-03-21 MED ORDER — BUPIVACAINE HCL 0.5 % IJ SOLN
2.0000 mL | INTRAMUSCULAR | Status: AC | PRN
  Administered 2024-03-21: 2 mL via INTRA_ARTICULAR

## 2024-03-21 MED ORDER — BUPIVACAINE HCL 0.25 % IJ SOLN: 2.0000 mL | INTRAMUSCULAR | Status: DC | PRN

## 2024-03-21 MED ORDER — METHYLPREDNISOLONE ACETATE 40 MG/ML IJ SUSP
40.0000 mg | INTRAMUSCULAR | Status: DC | PRN
Start: 2024-03-21 — End: 2024-03-21

## 2024-03-21 MED ORDER — LIDOCAINE HCL 1 % IJ SOLN
2.0000 mL | INTRAMUSCULAR | Status: AC | PRN
  Administered 2024-03-21: 2 mL

## 2024-03-21 MED ORDER — METHYLPREDNISOLONE ACETATE 40 MG/ML IJ SUSP
40.0000 mg | INTRAMUSCULAR | Status: AC | PRN
  Administered 2024-03-21: 40 mg via INTRA_ARTICULAR

## 2024-03-21 MED ORDER — LIDOCAINE HCL 1 % IJ SOLN: 2.0000 mL | INTRAMUSCULAR | Status: DC | PRN

## 2024-03-21 NOTE — Telephone Encounter (Signed)
 Please precert for right knee Zilretta. Dr.Xu's patient. Thanks

## 2024-03-21 NOTE — Progress Notes (Addendum)
 Office Visit Note   Patient: Tricia Ferguson           Date of Birth: [birth year]           MRN: [id number] Visit Date: 03/21/2024              Requested by: Dorothyann Peng, MD [address] [place],  [place] [address] PCP: Dorothyann Peng, MD   Assessment & Plan: Visit Diagnoses:  1. Chronic pain of right knee     Plan: Discussed with patient and her daughter that at this time, patient may not be able to tolerate a knee replacement given her age.  Will trial cortisone injection today as well as start planning for a custom DonJoy osteoarthritis brace.  Will also start authorization for Zilretta injections to see if that may give her different benefit.  Patient is understanding and agreeable with plan.  Follow-Up Instructions: No follow-ups on file.   Orders:  Orders Placed This Encounter  Procedures   Large Joint Inj   Large Joint Inj   XR KNEE 3 VIEW RIGHT   Meds ordered this encounter  Medications   bupivacaine (MARCAINE) 0.25 % (with pres) injection 2 mL   lidocaine (XYLOCAINE) 1 % (with pres) injection 2 mL   methylPREDNISolone acetate (DEPO-MEDROL) injection 40 mg      Procedures: Large Joint Inj: R knee on 03/21/2024 2:44 PM Indications: pain Details: 22 G needle  Arthrogram: No  Medications: 40 mg methylPREDNISolone acetate 40 MG/ML; 2 mL lidocaine 1 %; 2 mL bupivacaine 0.5 % Consent was given by the patient. Patient was prepped and draped in the usual sterile fashion.       Clinical Data: No additional findings.   Subjective: Chief Complaint  Patient presents with   Right Knee - Pain    Patient is presenting alongside her daughter.  Patient has been dealing with some right-sided knee pain for a long time.  Patient notes that she had a meniscus surgery 10 years ago.  Patient hasbeen dealing with right-sided knee pain more frequently recently.  Patient has had injections in the past including gel and steroid.  Patient states that it  has been a few years since she had a steroid injection.  Patient notes that the gel injections did not give her any relief initially.  Patient would like to try conservative options including cortisone and possible custom bracing.    Review of Systems  Constitutional: Negative.   HENT: Negative.    Eyes: Negative.   Respiratory: Negative.    Cardiovascular: Negative.   Endocrine: Negative.   Musculoskeletal: Negative.   Neurological: Negative.   Hematological: Negative.   Psychiatric/Behavioral: Negative.    All other systems reviewed and are negative.    Objective: Vital Signs: There were no vitals taken for this visit.  Physical Exam Vitals and nursing note reviewed.  Constitutional:      Appearance: She is well-developed.  HENT:     Head: Atraumatic.     Nose: Nose normal.  Eyes:     Extraocular Movements: Extraocular movements intact.  Cardiovascular:     Pulses: Normal pulses.  Pulmonary:     Effort: Pulmonary effort is normal.  Abdominal:     Palpations: Abdomen is soft.  Musculoskeletal:     Cervical back: Neck supple.  Skin:    General: Skin is warm.     Capillary Refill: Capillary refill takes less than 2 seconds.  Neurological:     Mental Status: She  is alert. Mental status is at baseline.  Psychiatric:        Behavior: Behavior normal.        Thought Content: Thought content normal.        Judgment: Judgment normal.     Ortho Exam Inspection reveals no gross abnormalities.  There is noted bony abnormality of the right knee area.  There is some tenderness to palpation throughout the medial lateral joint compartments.  Range of motion of the knee is decreased with extension and flexion due to restriction.  There is decreased strength with flexion extension of the knee as well as pain with ambulation. Specialty Comments:  No specialty comments available.  Imaging: XR KNEE 3 VIEW RIGHT Result Date: 03/21/2024 X-rays of the right knee show advanced  tricompartmental osteoarthritis with more severe disease in the lateral compartment.    PMFS History: Patient Active Problem List   Diagnosis Date Noted   Impacted cerumen of right ear 02/21/2024   Epistaxis 02/20/2024   Localized osteoarthritis of right knee 02/20/2024   Otalgia of right ear 01/10/2024   Right-sided tinnitus 01/10/2024   Encounter for general adult medical examination w/o abnormal findings 08/24/2023   Abnormal glucose 08/24/2023   Primary insomnia 08/24/2023   OSA (obstructive sleep apnea) 08/24/2023   Pain due to onychomycosis of toenails of both feet 06/08/2023   Porokeratosis 06/08/2023   Stage 3a chronic kidney disease (HCC) 02/16/2023   Hypertensive heart and renal disease 02/16/2023   Aortic atherosclerosis (HCC) 02/16/2023   Degenerative lumbar spinal stenosis 03/03/2021   Lumbar radiculitis 03/03/2021   Lumbago with sciatica, right side 08/12/2020   PAD (peripheral artery disease) (HCC) 09/06/2019   Essential hypertension, benign 01/04/2019   Estrogen deficiency 01/04/2019   Class 2 severe obesity due to excess calories with serious comorbidity and body mass index (BMI) of 36.0 to 36.9 in adult [facility]) 01/04/2019   Degeneration of lumbosacral intervertebral disc 07/31/2018   Past Medical History:  Diagnosis Date   Chronic kidney disease    Hypertension    Peripheral vascular disease (HCC)    Vitamin D deficiency disease     Family History  Problem Relation Age of Onset   Hypertension Mother    Throat cancer Mother    Prostate cancer Father    Tremor Father    Hypertension Sister    Diabetes Sister    Hypertension Brother    Hypertension Sister    Diabetes Sister    Hypertension Sister     Past Surgical History:  Procedure Laterality Date   ABDOMINAL HYSTERECTOMY     ESOPHAGOGASTRODUODENOSCOPY (EGD) WITH PROPOFOL N/A 01/11/2022   Procedure: ESOPHAGOGASTRODUODENOSCOPY (EGD) WITH PROPOFOL;  Surgeon: Wyline Mood, MD;  Location: [facility] ENDOSCOPY;   Service: Gastroenterology;  Laterality: N/A;   HEMORRHOID SURGERY     Social History   Occupational History   Occupation: retired  Tobacco Use   Smoking status: Former    Current packs/day: 0.00    Average packs/day: 1 pack/day for 20.0 years (20.0 ttl pk-yrs)    Types: Cigarettes    Start date: 65    Quit date: 2004    Years since quitting: 21.2   Smokeless tobacco: Never   Tobacco comments:    n/a  Vaping Use   Vaping status: Never Used  Substance and Sexual Activity   Alcohol use: No   Drug use: No   Sexual activity: Not Currently

## 2024-03-28 NOTE — Telephone Encounter (Signed)
VOB submitted for Zilretta, right knee.  

## 2024-04-02 DIAGNOSIS — H353221 Exudative age-related macular degeneration, left eye, with active choroidal neovascularization: Secondary | ICD-10-CM | POA: Diagnosis not present

## 2024-04-02 DIAGNOSIS — H353111 Nonexudative age-related macular degeneration, right eye, early dry stage: Secondary | ICD-10-CM | POA: Diagnosis not present

## 2024-04-02 DIAGNOSIS — H35363 Drusen (degenerative) of macula, bilateral: Secondary | ICD-10-CM | POA: Diagnosis not present

## 2024-04-06 ENCOUNTER — Other Ambulatory Visit: Payer: Self-pay | Admitting: Internal Medicine

## 2024-04-06 DIAGNOSIS — I1310 Hypertensive heart and chronic kidney disease without heart failure, with stage 1 through stage 4 chronic kidney disease, or unspecified chronic kidney disease: Secondary | ICD-10-CM

## 2024-04-16 ENCOUNTER — Telehealth: Payer: Self-pay

## 2024-04-16 DIAGNOSIS — G8929 Other chronic pain: Secondary | ICD-10-CM

## 2024-04-16 NOTE — Telephone Encounter (Signed)
 Patient would also like a call back concerning something to support her right knee.  Stated that it was mentioned to her at her last visit.  CB# [phone].  Please advise.  Thank you.

## 2024-04-16 NOTE — Telephone Encounter (Addendum)
 Talked with patient and advised that she is approved for Zilretta  right knee, but stated she needed to wait 3 months for injection.  Stated she will CB to schedule.  Zilretta  is in the refrigerator

## 2024-04-16 NOTE — Addendum Note (Signed)
 Addended by: Giannamarie Paulus L on: 04/16/2024 03:33 PM   Modules accepted: Orders

## 2024-04-16 NOTE — Telephone Encounter (Signed)
-----   Message from Ashley sent at 04/11/2024  4:04 PM EDT ----- I have it ordered-- I will look for it tomorrow if it is not here, I will need someone to look for it to be delivered on Friday so it can be put in the fridge. ----- Message ----- From: Lauraine Polite, RMA Sent: 04/11/2024   3:49 PM EDT To: Arliss Benton, RT  Can you order 1 Zilretta  for patient please?  Thank you

## 2024-04-16 NOTE — Telephone Encounter (Signed)
 Is this the custom unloader brace with donjoy that you want?

## 2024-04-17 NOTE — Telephone Encounter (Signed)
 I would suggest doing the Nano brace

## 2024-04-17 NOTE — Telephone Encounter (Signed)
 Notified patient that we going to send this order to [facility] with Don[employer]. They should be in touch with patient.

## 2024-04-30 DIAGNOSIS — H353221 Exudative age-related macular degeneration, left eye, with active choroidal neovascularization: Secondary | ICD-10-CM | POA: Diagnosis not present

## 2024-05-14 ENCOUNTER — Ambulatory Visit: Attending: Internal Medicine | Admitting: Audiologist

## 2024-05-14 DIAGNOSIS — H9041 Sensorineural hearing loss, unilateral, right ear, with unrestricted hearing on the contralateral side: Secondary | ICD-10-CM | POA: Diagnosis not present

## 2024-05-14 NOTE — Procedures (Signed)
  Outpatient Audiology and [facility] [address] [place], [place]  [address] [phone]  AUDIOLOGICAL  EVALUATION  NAME: Tricia Ferguson     DOB:   02-08-1934      MRN: [id number]                                                                                     DATE: 05/14/2024     REFERENT: Cleave Curling, MD STATUS: Outpatient DIAGNOSIS: Right Ear Sensorineural Hearing Loss   History: [name] was seen for an audiological evaluation to monitor her hearing in the right ear and check for changes after seeing Otolaryngology. [name] saw Dr. Darlin Ehrlich who removed cerumen from her right ear. [name] feels she is hearing better now and no longer has the 'woo' sound in that ear. No other changes to medical history. This is a follow up after diagnostic evaluation 02/16/2024.    Evaluation:  Otoscopy showed a clear view of the tympanic membranes, bilaterally. Cerumen is present but non occluding.  Audiometric testing was completed using Conventional Audiometry techniques with insert earphones and supraural headphones. Test results show sensorineural  loss 250-750Hz  rising to normal in the right ear. Left ear hearing still in normal to mild loss range.   Results:  The test results were reviewed with [name]. The right hear hearing loss is stable. No change from previous test. Still showing sensorineural  loss 250-750Hz  rising to normal in the right ear. Left ear hearing still in normal to mild loss range. [name] feels like the hearing is better after having the wax removed and no longer has the tinnitus sound.   Recommendations: Hearing loss is stable. No change after wax removal. Follow up if new symptoms arise or a change in hearing is perceived.    30 minutes spent testing and counseling on results.   If you have any questions please feel free to contact me at [phone].  Raynald Calkins Stalnaker Au.D.  Audiologist   05/14/2024  3:19 PM  Cc: Cleave Curling, MD

## 2024-07-23 DIAGNOSIS — H353221 Exudative age-related macular degeneration, left eye, with active choroidal neovascularization: Secondary | ICD-10-CM | POA: Diagnosis not present

## 2024-07-30 ENCOUNTER — Encounter: Payer: Self-pay | Admitting: Nurse Practitioner

## 2024-07-30 ENCOUNTER — Ambulatory Visit: Admitting: Nurse Practitioner

## 2024-07-30 ENCOUNTER — Ambulatory Visit: Payer: Self-pay

## 2024-07-30 VITALS — BP 120/60 | HR 74 | Temp 98.8°F | Ht 60.0 in | Wt 189.2 lb

## 2024-07-30 DIAGNOSIS — R0789 Other chest pain: Secondary | ICD-10-CM | POA: Diagnosis not present

## 2024-07-30 DIAGNOSIS — E6609 Other obesity due to excess calories: Secondary | ICD-10-CM

## 2024-07-30 DIAGNOSIS — M25512 Pain in left shoulder: Secondary | ICD-10-CM | POA: Diagnosis not present

## 2024-07-30 DIAGNOSIS — E66812 Obesity, class 2: Secondary | ICD-10-CM

## 2024-07-30 DIAGNOSIS — Z6836 Body mass index (BMI) 36.0-36.9, adult: Secondary | ICD-10-CM

## 2024-07-30 MED ORDER — PREDNISONE 20 MG PO TABS: ORAL_TABLET | ORAL | 0 refills | Status: AC

## 2024-07-30 NOTE — Telephone Encounter (Signed)
 FYI Only or Action Required?: FYI only for provider.  Patient was last seen in primary care on 02/20/2024 by Jarold Medici, MD.  Called Nurse Triage reporting Shoulder Pain.  Symptoms began several days ago.  Interventions attempted: Nothing.  Symptoms are: unchanged.  Triage Disposition: See HCP Within 8 Hours (Or PCP Triage)  Patient/caregiver understands and will follow disposition?: Yes      Copied from CRM #[id number]. Topic: Clinical - Red Word Triage >> Jul 30, 2024 10:06 AM Tricia Ferguson wrote: Red Word that prompted transfer to Nurse Triage: Patient has been having pain under her left shoulder since last Thursday. Has been taking Tylenol  but the pain hasn't gotten any better. Reason for Disposition  [1] Shoulder pains with exertion (e.g., walking) AND [2] pain goes away on resting AND [3] not present now  Answer Assessment - Initial Assessment Questions 1. ONSET: When did the pain start?     Last Thursday 2. LOCATION: Where is the pain located?     Left shoulder and arm, under shoulder blade 3. PAIN: How bad is the pain? (Scale 1-10; or mild, moderate, severe)     severe 4. WORK OR EXERCISE: Has there been any recent work or exercise that involved this part of the body?     denies 5. CAUSE: What do you think is causing the shoulder pain?     unknown 6. OTHER SYMPTOMS: Do you have any other symptoms? (e.g., neck pain, swelling, rash, fever, numbness, weakness)     States pain is burning 7. PREGNANCY: Is there any chance you are pregnant? When was your last menstrual period?     na  Protocols used: Shoulder Pain-A-AH

## 2024-07-30 NOTE — Progress Notes (Signed)
 LILLETTE Kristeen JINNY Gladis, CMA,acting as a Neurosurgeon for Gaines Ada, FNP.,have documented all relevant documentation on the behalf of Gaines Ada, FNP,as directed by  Gaines Ada, FNP while in the presence of Gaines Ada, FNP.  Subjective:  Patient ID: Tricia Ferguson , female    DOB: Oct 03, 1934 , 89 y.o.   MRN: [id number]  Chief Complaint  Patient presents with   Shoulder Pain    Patient presents today for left shoulder pain, patient reports the pain first started last Thursday. She can't recall hitting shoulder or anything she reports it just started hurting. She did try OTC medications with no relief.       HPI  Discussed the use of AI scribe software for clinical note transcription with the patient, who gave verbal consent to proceed.  History of Present Illness Tricia Ferguson is an 89 year old female with arthritis and a twisted spine who presents with new onset shoulder pain.  She has been experiencing shoulder pain since last Thursday, located underneath the shoulder blade, described as a needle-like sensation. The pain began after using an exercise band, which she had not used in years. It is different from previous pains and is exacerbated by wearing a tight bra. No recent falls or injuries.  Her medical history includes arthritis affecting her entire body and a twisted spine, which has caused significant pain in the past. She notes a difference in breast size, which she attributes to her back issues. She previously received injections for back pain at [facility] over a year ago but has not had treatment there since.  She has been managing the shoulder pain with Tylenol , taking two tablets on Thursday, Friday, and Saturday nights, but did not take any on Sunday. She is unable to take ibuprofen.  No shortness of breath or chest pain, although she sometimes experiences a sensation similar to reflux.   Past Medical History:  Diagnosis Date   Allergy    Arthritis     Chronic kidney disease    GERD (gastroesophageal reflux disease)    Heart murmur    Hypertension    Neuromuscular disorder (HCC)    Peripheral vascular disease (HCC)    Sleep apnea    Vitamin D deficiency disease      Family History  Problem Relation Age of Onset   Hypertension Mother    Throat cancer Mother    Prostate cancer Father    Tremor Father    Hypertension Sister    Diabetes Sister    Hypertension Brother    Hypertension Sister    Diabetes Sister    Hypertension Sister      Current Outpatient Medications:    Acetaminophen  (TYLENOL  PO), Take by mouth., Disp: , Rfl:    Ascorbic Acid (VITAMIN C PO), Take by mouth., Disp: , Rfl:    calcium carbonate (TUMS - DOSED IN MG ELEMENTAL CALCIUM) 500 MG chewable tablet, Chew 2 tablets by mouth daily. , Disp: , Rfl:    Chlorphen-PE-Acetaminophen  (NOREL AD) 4-10-325 MG TABS, One tab po nightly prn, Disp: , Rfl:    Cholecalciferol (VITAMIN D3 PO), 1 cap PO QD, Disp: , Rfl:    Ciclopirox  0.77 % gel, Apply 1 Application topically 2 (two) times daily., Disp: 45 g, Rfl: 2   Garlic 1000 MG CAPS, Take by mouth., Disp: , Rfl:    glucosamine-chondroitin 500-400 MG tablet, Take 1 tablet by mouth 3 (three) times daily., Disp: , Rfl:    levocetirizine (XYZAL ) 5 MG tablet,  TAKE 1 TABLET(5 MG) BY MOUTH EVERY EVENING, Disp: 90 tablet, Rfl: 1   lisinopril  (ZESTRIL ) 5 MG tablet, TAKE 1 TABLET(5 MG) BY MOUTH DAILY, Disp: 90 tablet, Rfl: 2   Multiple Vitamin (MULTIVITAMIN) tablet, Take 1 tablet by mouth daily., Disp: , Rfl:    Multiple Vitamins-Minerals (PRESERVISION AREDS PO), Take by mouth., Disp: , Rfl:    Omega-3 Fatty Acids (FISH OIL) 1000 MG CAPS, Take 1 tablet by mouth daily., Disp: , Rfl:    predniSONE  (DELTASONE ) 20 MG tablet, Take 2 tabs by mouth daily x 3 days, Disp: 6 tablet, Rfl: 0   Allergies  Allergen Reactions   Amoxicillin  Itching   Penicillins Hives   Potassium-Containing Compounds Itching     Review of Systems   Constitutional: Negative.   Respiratory: Negative.    Cardiovascular: Negative.   Musculoskeletal:        Shoulder pain  Neurological: Negative.   Psychiatric/Behavioral: Negative.       Today's Vitals   07/30/24 1608  BP: 120/60  Pulse: 74  Temp: 98.8 F (37.1 C)  TempSrc: Oral  Weight: 189 lb 3.2 oz (85.8 kg)  Height: 5' (1.524 m)  PainSc: 7   PainLoc: Shoulder   Body mass index is 36.95 kg/m.  Wt Readings from Last 3 Encounters:  07/30/24 189 lb 3.2 oz (85.8 kg)  02/28/24 183 lb (83 kg)  02/20/24 188 lb 12.8 oz (85.6 kg)     Objective:  Physical Exam Vitals and nursing note reviewed.  Constitutional:      General: She is not in acute distress.    Appearance: Normal appearance.  Cardiovascular:     Pulses: Normal pulses.     Heart sounds: Normal heart sounds. No murmur heard. Pulmonary:     Effort: Pulmonary effort is normal. No respiratory distress.     Breath sounds: Normal breath sounds. No wheezing.  Musculoskeletal:        General: Tenderness (left shoulder) present. No swelling.     Comments: Pain to anterior bursa space. Range of motion decreased.  Neurological:     Mental Status: She is alert.        Assessment And Plan:  Acute pain of left shoulder Assessment & Plan: She has a previous history of left shoulder pain and likely has some osteoarthritis. She has had previous injections not administered for over a year due to frequency limitations. Current pain distinct from osteoarthritis-related pain. Acute needle-like pain post-exercise, likely musculoskeletal strain. No trauma or systemic symptoms. Tylenol  preferred for pain management. - Continue Tylenol  up to 4000 mg daily as needed. - Avoid ibuprofen due to intolerance. - will treat with short course of steroids.   Orders: -     CBC with Differential/Platelet -     Iron, TIBC and Ferritin Panel  Chest discomfort Assessment & Plan: EKG done with frequent PACs   Orders: -     EKG  12-Lead -     Comprehensive metabolic panel with GFR -     CBC with Differential/Platelet -     TSH -     Iron, TIBC and Ferritin Panel  Class 2 obesity due to excess calories with body mass index (BMI) of 36.0 to 36.9 in adult, unspecified whether serious comorbidity present Assessment & Plan: She is encouraged to strive for BMI less than 30 to decrease cardiac risk. Advised to aim for at least 150 minutes of exercise per week.    Other orders -     predniSONE ;  Take 2 tabs by mouth daily x 3 days  Dispense: 6 tablet; Refill: 0    Return for keep same next.  Patient was given opportunity to ask questions. Patient verbalized understanding of the plan and was able to repeat key elements of the plan. All questions were answered to their satisfaction.    LILLETTE Gaines Ada, FNP, have reviewed all documentation for this visit. The documentation on 07/30/24 for the exam, diagnosis, procedures, and orders are all accurate and complete.   IF YOU HAVE BEEN REFERRED TO A SPECIALIST, IT MAY TAKE 1-2 WEEKS TO SCHEDULE/PROCESS THE REFERRAL. IF YOU HAVE NOT HEARD FROM US /SPECIALIST IN TWO WEEKS, PLEASE GIVE US  A CALL AT [phone].

## 2024-07-31 LAB — COMPREHENSIVE METABOLIC PANEL WITH GFR
ALT: 17 IU/L (ref 0–32)
AST: 22 IU/L (ref 0–40)
Albumin: 4.1 g/dL (ref 3.7–4.7)
Alkaline Phosphatase: 88 IU/L (ref 44–121)
BUN/Creatinine Ratio: 32 — ABNORMAL HIGH (ref 12–28)
BUN: 29 mg/dL — ABNORMAL HIGH (ref 8–27)
Bilirubin Total: 0.3 mg/dL (ref 0.0–1.2)
CO2: 21 mmol/L (ref 20–29)
Calcium: 9.3 mg/dL (ref 8.7–10.3)
Chloride: 107 mmol/L — ABNORMAL HIGH (ref 96–106)
Creatinine, Ser: 0.91 mg/dL (ref 0.57–1.00)
Globulin, Total: 2.6 g/dL (ref 1.5–4.5)
Glucose: 70 mg/dL (ref 70–99)
Potassium: 4.5 mmol/L (ref 3.5–5.2)
Sodium: 141 mmol/L (ref 134–144)
Total Protein: 6.7 g/dL (ref 6.0–8.5)
eGFR: 61 mL/min/1.73 (ref >59)

## 2024-07-31 LAB — CBC WITH DIFFERENTIAL/PLATELET
Basophils Absolute: 0.1 x10E3/uL (ref 0.0–0.2)
Basos: 1 %
EOS (ABSOLUTE): 0.2 x10E3/uL (ref 0.0–0.4)
Eos: 2 %
Hematocrit: 33.8 % — ABNORMAL LOW (ref 34.0–46.6)
Hemoglobin: 10.6 g/dL — ABNORMAL LOW (ref 11.1–15.9)
Immature Grans (Abs): 0 x10E3/uL (ref 0.0–0.1)
Immature Granulocytes: 0 %
Lymphocytes Absolute: 4.6 x10E3/uL — ABNORMAL HIGH (ref 0.7–3.1)
Lymphs: 45 %
MCH: 30.2 pg (ref 26.6–33.0)
MCHC: 31.4 g/dL — ABNORMAL LOW (ref 31.5–35.7)
MCV: 96 fL (ref 79–97)
Monocytes Absolute: 1.1 x10E3/uL — ABNORMAL HIGH (ref 0.1–0.9)
Monocytes: 10 %
Neutrophils Absolute: 4.3 x10E3/uL (ref 1.4–7.0)
Neutrophils: 42 %
Platelets: 253 x10E3/uL (ref 150–450)
RBC: 3.51 x10E6/uL — ABNORMAL LOW (ref 3.77–5.28)
RDW: 11.8 % (ref 11.7–15.4)
WBC: 10.2 x10E3/uL (ref 3.4–10.8)

## 2024-07-31 LAB — IRON,TIBC AND FERRITIN PANEL
Ferritin: 140 ng/mL (ref 15–150)
Iron Saturation: 23 % (ref 15–55)
Iron: 59 ug/dL (ref 27–139)
Total Iron Binding Capacity: 255 ug/dL (ref 250–450)
UIBC: 196 ug/dL (ref 118–369)

## 2024-07-31 LAB — TSH: TSH: 1.86 u[IU]/mL (ref 0.450–4.500)

## 2024-08-10 ENCOUNTER — Ambulatory Visit: Payer: Self-pay | Admitting: Nurse Practitioner

## 2024-08-10 DIAGNOSIS — M25512 Pain in left shoulder: Secondary | ICD-10-CM | POA: Insufficient documentation

## 2024-08-10 DIAGNOSIS — E66812 Obesity, class 2: Secondary | ICD-10-CM | POA: Insufficient documentation

## 2024-08-10 DIAGNOSIS — R0789 Other chest pain: Secondary | ICD-10-CM | POA: Insufficient documentation

## 2024-08-10 NOTE — Assessment & Plan Note (Addendum)
 She is encouraged to strive for BMI less than 30 to decrease cardiac risk. Advised to aim for at least 150 minutes of exercise per week as tolerated

## 2024-08-10 NOTE — Assessment & Plan Note (Addendum)
 She has a previous history of left shoulder pain and likely has some osteoarthritis. She has had previous injections not administered for over a year due to frequency limitations. Current pain distinct from osteoarthritis-related pain. Acute needle-like pain post-exercise, likely musculoskeletal strain. No trauma or systemic symptoms. Tylenol  preferred for pain management. - Continue Tylenol  up to 4000 mg daily as needed. - Avoid ibuprofen due to intolerance. - will treat with short course of steroids.

## 2024-08-10 NOTE — Assessment & Plan Note (Signed)
 EKG done with frequent PACs

## 2024-08-27 ENCOUNTER — Ambulatory Visit: Attending: Internal Medicine

## 2024-08-27 ENCOUNTER — Encounter: Payer: Self-pay | Admitting: Internal Medicine

## 2024-08-27 ENCOUNTER — Ambulatory Visit: Payer: Self-pay | Admitting: Internal Medicine

## 2024-08-27 VITALS — BP 130/70 | HR 77 | Temp 98.1°F | Ht 60.0 in | Wt 188.8 lb

## 2024-08-27 DIAGNOSIS — Z7409 Other reduced mobility: Secondary | ICD-10-CM

## 2024-08-27 DIAGNOSIS — I1310 Hypertensive heart and chronic kidney disease without heart failure, with stage 1 through stage 4 chronic kidney disease, or unspecified chronic kidney disease: Secondary | ICD-10-CM | POA: Diagnosis not present

## 2024-08-27 DIAGNOSIS — Z789 Other specified health status: Secondary | ICD-10-CM

## 2024-08-27 DIAGNOSIS — I700 Atherosclerosis of aorta: Secondary | ICD-10-CM

## 2024-08-27 DIAGNOSIS — Z0000 Encounter for general adult medical examination without abnormal findings: Secondary | ICD-10-CM

## 2024-08-27 DIAGNOSIS — R252 Cramp and spasm: Secondary | ICD-10-CM

## 2024-08-27 DIAGNOSIS — E66812 Obesity, class 2: Secondary | ICD-10-CM

## 2024-08-27 DIAGNOSIS — R002 Palpitations: Secondary | ICD-10-CM

## 2024-08-27 DIAGNOSIS — Z6836 Body mass index (BMI) 36.0-36.9, adult: Secondary | ICD-10-CM

## 2024-08-27 DIAGNOSIS — N1831 Chronic kidney disease, stage 3a: Secondary | ICD-10-CM | POA: Diagnosis not present

## 2024-08-27 DIAGNOSIS — R040 Epistaxis: Secondary | ICD-10-CM

## 2024-08-27 LAB — POCT URINALYSIS DIP (CLINITEK)
Bilirubin, UA: NEGATIVE
Blood, UA: NEGATIVE
Glucose, UA: NEGATIVE mg/dL
Ketones, POC UA: NEGATIVE mg/dL
Leukocytes, UA: NEGATIVE
Nitrite, UA: NEGATIVE
POC PROTEIN,UA: NEGATIVE
Spec Grav, UA: 1.015 (ref 1.010–1.025)
Urobilinogen, UA: 0.2 U/dL
pH, UA: 5.5 (ref 5.0–8.0)

## 2024-08-27 NOTE — Progress Notes (Unsigned)
 EP to read.

## 2024-08-27 NOTE — Progress Notes (Signed)
 I,Tricia Ferguson, CMA,acting as a Neurosurgeon for Tricia LOISE Slocumb, MD.,have documented all relevant documentation on the behalf of Tricia LOISE Slocumb, MD,as directed by  Tricia LOISE Slocumb, MD while in the presence of Tricia LOISE Slocumb, MD.  Subjective:    Patient ID: Tricia Ferguson , female    DOB: Oct 01, 1934 , 89 y.o.   MRN: [id number]  Chief Complaint  Patient presents with   Annual Exam    Patient presents today for annual exam. She reports compliance with medications. Denies headache, chest pain & sob. While here today she complains of weak knees. She has noticed struggling more when getting up from sitting down. EKG done on 8/18.    Hypertension   Pre-visit Screening Tool Documentation    HPI Discussed the use of AI scribe software for clinical note transcription with the patient, who gave verbal consent to proceed.  History of Present Illness Tricia Ferguson is an 89 year old female with chronic knee and back pain who presents for a physical exam and blood pressure check.  She has chronic knee and back pain, with the last back injection received about a year ago. She has undergone four to five injections previously, which did not provide lasting relief. Knee pain persists, and she mentions needing another injection. Knee replacement surgery has been advised against due to rehabilitation challenges at her age.  She is concerned about her mobility, stating she is not as mobile as she desires. She finds it difficult to get up and is considering physical therapy, although transportation is a concern. She is able to drive to nearby locations such as the grocery store and drugstore. She performs exercises three times a day as taught by a therapist but does not engage in all recommended activities like biking.  She has a history of palpitations since childhood, which occur occasionally depending on her diet. She feels more tired than usual and experiences dizziness, particularly  when getting up in the morning or turning over in bed at night. No new shortness of breath or swelling in her ankles. She drinks about five bottles of water a day.  She experiences occasional cramping in her hands, describing it as her fingers 'draw' or get stuck, which she manages by straightening them out and wearing gloves. No loss of dexterity.  Her last cardiology visit was in 2022, where she had an echocardiogram showing mild calcification of the mitral valve. She has not noticed any increase in shortness of breath or swelling in her ankles since then.   Hypertension This is a chronic problem. The current episode started more than 1 year ago. The problem has been gradually improving since onset. The problem is controlled. Pertinent negatives include no blurred vision. Risk factors for coronary artery disease include obesity, post-menopausal state and sedentary lifestyle. Past treatments include ACE inhibitors. The current treatment provides moderate improvement. Compliance problems include exercise.      Past Medical History:  Diagnosis Date   Allergy    Arthritis    Chronic kidney disease    GERD (gastroesophageal reflux disease)    Heart murmur    Hypertension    Neuromuscular disorder (HCC)    Peripheral vascular disease (HCC)    Sleep apnea    Vitamin D deficiency disease      Family History  Problem Relation Age of Onset   Hypertension Mother    Throat cancer Mother    Prostate cancer Father    Tremor Father    Hypertension  Sister    Diabetes Sister    Hypertension Brother    Hypertension Sister    Diabetes Sister    Hypertension Sister      Current Outpatient Medications:    Acetaminophen  (TYLENOL  PO), Take by mouth., Disp: , Rfl:    Ascorbic Acid (VITAMIN C PO), Take by mouth., Disp: , Rfl:    calcium carbonate (TUMS - DOSED IN MG ELEMENTAL CALCIUM) 500 MG chewable tablet, Chew 2 tablets by mouth daily. , Disp: , Rfl:    Chlorphen-PE-Acetaminophen  (NOREL AD)  4-10-325 MG TABS, One tab po nightly prn, Disp: , Rfl:    Cholecalciferol (VITAMIN D3 PO), 1 cap PO QD, Disp: , Rfl:    Ciclopirox  0.77 % gel, Apply 1 Application topically 2 (two) times daily., Disp: 45 g, Rfl: 2   Garlic 1000 MG CAPS, Take by mouth., Disp: , Rfl:    glucosamine-chondroitin 500-400 MG tablet, Take 1 tablet by mouth 3 (three) times daily., Disp: , Rfl:    levocetirizine (XYZAL ) 5 MG tablet, TAKE 1 TABLET(5 MG) BY MOUTH EVERY EVENING, Disp: 90 tablet, Rfl: 1   lisinopril  (ZESTRIL ) 5 MG tablet, TAKE 1 TABLET(5 MG) BY MOUTH DAILY, Disp: 90 tablet, Rfl: 2   Multiple Vitamin (MULTIVITAMIN) tablet, Take 1 tablet by mouth daily., Disp: , Rfl:    Multiple Vitamins-Minerals (PRESERVISION AREDS PO), Take by mouth., Disp: , Rfl:    Omega-3 Fatty Acids (FISH OIL) 1000 MG CAPS, Take 1 tablet by mouth daily., Disp: , Rfl:    predniSONE  (DELTASONE ) 20 MG tablet, Take 2 tabs by mouth daily x 3 days, Disp: 6 tablet, Rfl: 0   Allergies  Allergen Reactions   Amoxicillin  Itching   Penicillins Hives   Potassium-Containing Compounds Itching      The patient states she uses status post hysterectomy for birth control. No LMP recorded. Patient has had a hysterectomy.. Negative for Dysmenorrhea. Negative for: breast discharge, breast lump(s), breast pain and breast self exam. Associated symptoms include abnormal vaginal bleeding. Pertinent negatives include abnormal bleeding (hematology), anxiety, decreased libido, depression, difficulty falling sleep, dyspareunia, history of infertility, nocturia, sexual dysfunction, sleep disturbances, urinary incontinence, urinary urgency, vaginal discharge and vaginal itching. Diet regular.The patient states her exercise level is  intermittent.  . The patient's tobacco use is:  Social History   Tobacco Use  Smoking Status Former   Current packs/day: 0.00   Average packs/day: 1 pack/day for 20.0 years (20.0 ttl pk-yrs)   Types: Cigarettes   Start date: 67    Quit date: 2004   Years since quitting: 21.7  Smokeless Tobacco Never  Tobacco Comments   n/a  . She has been exposed to passive smoke. The patient's alcohol use is:  Social History   Substance and Sexual Activity  Alcohol Use No    Review of Systems  Constitutional: Negative.   HENT: Negative.    Eyes: Negative.  Negative for blurred vision.  Respiratory: Negative.    Cardiovascular: Negative.   Gastrointestinal: Negative.   Endocrine: Negative.   Genitourinary: Negative.   Musculoskeletal:  Positive for arthralgias and myalgias.  Skin: Negative.   Allergic/Immunologic: Negative.   Neurological: Negative.   Hematological: Negative.   Psychiatric/Behavioral: Negative.       Today's Vitals   08/27/24 1052  BP: 130/70  Pulse: 77  Temp: 98.1 F (36.7 C)  SpO2: 98%  Weight: 188 lb 12.8 oz (85.6 kg)  Height: 5' (1.524 m)   Body mass index is 36.87 kg/m.  Wt Readings  from Last 3 Encounters:  08/27/24 188 lb 12.8 oz (85.6 kg)  07/30/24 189 lb 3.2 oz (85.8 kg)  02/28/24 183 lb (83 kg)     Objective:  Physical Exam Vitals and nursing note reviewed.  Constitutional:      Appearance: Normal appearance. She is obese.     Comments: Examined while seated in chair with pants/shoes on per her request  HENT:     Head: Normocephalic and atraumatic.     Right Ear: Tympanic membrane, ear canal and external ear normal.     Left Ear: Tympanic membrane, ear canal and external ear normal.     Nose: Nose normal.     Mouth/Throat:     Mouth: Mucous membranes are moist.     Pharynx: Oropharynx is clear.  Eyes:     Extraocular Movements: Extraocular movements intact.     Conjunctiva/sclera: Conjunctivae normal.     Pupils: Pupils are equal, round, and reactive to light.  Cardiovascular:     Rate and Rhythm: Normal rate and regular rhythm.     Pulses: Normal pulses.     Heart sounds: Normal heart sounds.  Pulmonary:     Effort: Pulmonary effort is normal.     Breath sounds:  Normal breath sounds.  Chest:  Breasts:    Tanner Score is 5.     Comments: pendulous Abdominal:     General: Bowel sounds are normal.     Palpations: Abdomen is soft.     Comments: Obese, soft. Difficult to assess organomegaly  Genitourinary:    Comments: deferred Musculoskeletal:        General: Normal range of motion.     Cervical back: Normal range of motion and neck supple.     Comments: Ambulatory with cane  Skin:    General: Skin is warm and dry.  Neurological:     General: No focal deficit present.     Mental Status: She is alert and oriented to person, place, and time.  Psychiatric:        Mood and Affect: Mood normal.        Behavior: Behavior normal.         Assessment And Plan:     Encounter for general adult medical examination w/o abnormal findings Assessment & Plan: A full exam was performed.  Importance of monthly self breast exams was discussed with the patient.  She is advised to get 30-45 minutes of regular exercise, no less than four to five days per week. Both weight-bearing and aerobic exercises are recommended.  She is advised to follow a healthy diet with at least six fruits/veggies per day, decrease intake of red meat and other saturated fats and to increase fish intake to twice weekly.  Meats/fish should not be fried -- baked, boiled or broiled is preferable. It is also important to cut back on your sugar intake.  Be sure to read labels - try to avoid anything with added sugar, high fructose corn syrup or other sweeteners.  If you must use a sweetener, you can try stevia or monkfruit.  It is also important to avoid artificially sweetened foods/beverages and diet drinks. Lastly, wear SPF 50 sunscreen on exposed skin and when in direct sunlight for an extended period of time.  Be sure to avoid fast food restaurants and aim for at least 60 ounces of water daily.       Hypertensive heart and renal disease with renal failure, stage 1 through stage 4 or  unspecified  chronic kidney disease, without heart failure Assessment & Plan: Chronic, controlled.  She will continue with lisinopril  5mg  daily. EKG performed, NSR w/ frequent PACs, voltage criteria for LVH, old anteroseptal infarct.    Reminded to follow a low sodium diet. She will f/u in four to six months.   Orders: -     Lipid panel -     POCT URINALYSIS DIP (CLINITEK) -     Microalbumin / creatinine urine ratio -     BMP8+EGFR  Aortic atherosclerosis (HCC) Assessment & Plan: Chronic, LDL goal < 70. She should be on statin therapy. She still does not wish to add additional meds.  Importance of dietary compliance was stressed to the patient. Encouraged to incorporate more exercise into her daily routine.       Stage 3a chronic kidney disease (HCC) Assessment & Plan: Chronic, reminded to avoid NSAIDs, stay well hydrated and keep BP well controlled to decrease risk of CKD progression.   Orders: -     PTH, intact and calcium -     Phosphorus -     Protein electrophoresis, serum  Palpitations Assessment & Plan: Intermittent palpitations and dizziness with history of palpitations since childhood. Previous echocardiogram showed mild mitral valve calcification. Increased fatigue and dizziness noted. No recent cardiology follow-up. - Consider heart monitor for arrhythmias. - Discuss potential pacemaker if pauses detected.  Orders: -     LONG TERM MONITOR (3-14 DAYS); Future  Impaired mobility and activities of daily living Assessment & Plan: Reduced mobility due to knee and back issues. Interest in physical therapy but has transportation concerns. Performs some home exercises. - Refer to physical therapy nearby. - Encourage continuation of home exercises.  Orders: -     Ambulatory referral to Physical Therapy  Cramping of hands Assessment & Plan: Intermittent hand cramps with fingers occasionally getting stuck. No loss of dexterity. Adequate hydration and following therapy  exercises. - Check magnesium level.  Orders: -     Magnesium  Class 2 severe obesity due to excess calories with serious comorbidity and body mass index (BMI) of 36.0 to 36.9 in adult [facility]) Assessment & Plan: She is encouraged to strive for BMI less than 30 to decrease cardiac risk. Advised to aim for at least 150 minutes of exercise per week.     Return for 1 year physical, 6 month bp. Patient was given opportunity to ask questions. Patient verbalized understanding of the plan and was able to repeat key elements of the plan. All questions were answered to their satisfaction.   I, Tricia LOISE Slocumb, MD, have reviewed all documentation for this visit. The documentation on 08/27/24 for the exam, diagnosis, procedures, and orders are all accurate and complete.

## 2024-08-27 NOTE — Assessment & Plan Note (Signed)
 Chronic, reminded to avoid NSAIDs, stay well hydrated and keep BP well controlled to decrease risk of CKD progression.

## 2024-08-27 NOTE — Patient Instructions (Signed)

## 2024-08-28 LAB — PROTEIN ELECTROPHORESIS, SERUM
A/G Ratio: 1 (ref 0.7–1.7)
Albumin ELP: 3.5 g/dL (ref 2.9–4.4)
Alpha 1: 0.2 g/dL (ref 0.0–0.4)
Alpha 2: 0.8 g/dL (ref 0.4–1.0)
Beta: 1.1 g/dL (ref 0.7–1.3)
Gamma Globulin: 1.3 g/dL (ref 0.4–1.8)
Globulin, Total: 3.4 g/dL (ref 2.2–3.9)
Total Protein: 6.9 g/dL (ref 6.0–8.5)

## 2024-08-28 LAB — BMP8+EGFR
BUN/Creatinine Ratio: 24 (ref 12–28)
BUN: 21 mg/dL (ref 8–27)
CO2: 23 mmol/L (ref 20–29)
Calcium: 9.4 mg/dL (ref 8.7–10.3)
Chloride: 103 mmol/L (ref 96–106)
Creatinine, Ser: 0.86 mg/dL (ref 0.57–1.00)
Glucose: 85 mg/dL (ref 70–99)
Potassium: 4.3 mmol/L (ref 3.5–5.2)
Sodium: 141 mmol/L (ref 134–144)
eGFR: 65 mL/min/1.73 (ref >59)

## 2024-08-28 LAB — LIPID PANEL
Chol/HDL Ratio: 2.1 ratio (ref 0.0–4.4)
Cholesterol, Total: 161 mg/dL (ref 100–199)
HDL: 76 mg/dL (ref >39)
LDL Chol Calc (NIH): 71 mg/dL (ref 0–99)
Triglycerides: 72 mg/dL (ref 0–149)
VLDL Cholesterol Cal: 14 mg/dL (ref 5–40)

## 2024-08-28 LAB — MAGNESIUM: Magnesium: 2 mg/dL (ref 1.6–2.3)

## 2024-08-28 LAB — MICROALBUMIN / CREATININE URINE RATIO
Creatinine, Urine: 57.1 mg/dL
Microalb/Creat Ratio: 10 mg/g{creat} (ref 0–29)
Microalbumin, Urine: 5.7 ug/mL

## 2024-08-28 LAB — PTH, INTACT AND CALCIUM: PTH: 30 pg/mL (ref 15–65)

## 2024-08-28 LAB — PHOSPHORUS: Phosphorus: 3.1 mg/dL (ref 3.0–4.3)

## 2024-08-28 NOTE — Progress Notes (Signed)
 Okay; thanks.

## 2024-09-02 DIAGNOSIS — R002 Palpitations: Secondary | ICD-10-CM | POA: Insufficient documentation

## 2024-09-02 DIAGNOSIS — Z7409 Other reduced mobility: Secondary | ICD-10-CM | POA: Insufficient documentation

## 2024-09-02 DIAGNOSIS — R252 Cramp and spasm: Secondary | ICD-10-CM | POA: Insufficient documentation

## 2024-09-02 NOTE — Assessment & Plan Note (Signed)
 She is encouraged to strive for BMI less than 30 to decrease cardiac risk. Advised to aim for at least 150 minutes of exercise per week.

## 2024-09-02 NOTE — Assessment & Plan Note (Signed)
 Reduced mobility due to knee and back issues. Interest in physical therapy but has transportation concerns. Performs some home exercises. - Refer to physical therapy nearby. - Encourage continuation of home exercises.

## 2024-09-02 NOTE — Assessment & Plan Note (Signed)
 Intermittent hand cramps with fingers occasionally getting stuck. No loss of dexterity. Adequate hydration and following therapy exercises. - Check magnesium level.

## 2024-09-02 NOTE — Assessment & Plan Note (Signed)

## 2024-09-02 NOTE — Assessment & Plan Note (Addendum)
 Chronic, controlled.  She will continue with lisinopril  5mg  daily. EKG performed, NSR w/ frequent PACs, voltage criteria for LVH, old anteroseptal infarct.    Reminded to follow a low sodium diet. She will f/u in four to six months.

## 2024-09-02 NOTE — Assessment & Plan Note (Signed)
 Chronic, LDL goal < 70. She should be on statin therapy. She still does not wish to add additional meds.  Importance of dietary compliance was stressed to the patient. Encouraged to incorporate more exercise into her daily routine.

## 2024-09-02 NOTE — Assessment & Plan Note (Signed)
 Intermittent palpitations and dizziness with history of palpitations since childhood. Previous echocardiogram showed mild mitral valve calcification. Increased fatigue and dizziness noted. No recent cardiology follow-up. - Consider heart monitor for arrhythmias. - Discuss potential pacemaker if pauses detected.

## 2024-09-11 ENCOUNTER — Other Ambulatory Visit (INDEPENDENT_AMBULATORY_CARE_PROVIDER_SITE_OTHER)

## 2024-09-11 ENCOUNTER — Ambulatory Visit (INDEPENDENT_AMBULATORY_CARE_PROVIDER_SITE_OTHER): Admitting: Physician Assistant

## 2024-09-11 DIAGNOSIS — M25512 Pain in left shoulder: Secondary | ICD-10-CM | POA: Diagnosis not present

## 2024-09-11 DIAGNOSIS — M1711 Unilateral primary osteoarthritis, right knee: Secondary | ICD-10-CM

## 2024-09-11 DIAGNOSIS — G8929 Other chronic pain: Secondary | ICD-10-CM | POA: Diagnosis not present

## 2024-09-11 MED ORDER — BUPIVACAINE HCL 0.25 % IJ SOLN
2.0000 mL | INTRAMUSCULAR | Status: AC | PRN
  Administered 2024-09-11: 2 mL via INTRA_ARTICULAR

## 2024-09-11 MED ORDER — METHYLPREDNISOLONE ACETATE 40 MG/ML IJ SUSP
40.0000 mg | INTRAMUSCULAR | Status: AC | PRN
  Administered 2024-09-11: 40 mg via INTRA_ARTICULAR

## 2024-09-11 MED ORDER — LIDOCAINE HCL 1 % IJ SOLN
2.0000 mL | INTRAMUSCULAR | Status: AC | PRN
  Administered 2024-09-11: 2 mL

## 2024-09-11 MED ORDER — METHOCARBAMOL 500 MG PO TABS: 500.0000 mg | ORAL_TABLET | Freq: Every evening | ORAL | 0 refills | Status: AC | PRN

## 2024-09-11 NOTE — Progress Notes (Signed)
 Office Visit Note   Patient: Tricia Ferguson           Date of Birth: 01/10/1934           MRN: [id number] Visit Date: 09/11/2024              Requested by: Jarold Medici, MD [address] [place],  [place] [address] PCP: Jarold Medici, MD   Assessment & Plan: Visit Diagnoses:  1. Chronic left shoulder pain   2. Localized osteoarthritis of right knee     Plan: Impression is right knee osteoarthritis and left shoulder parascapular pain.  Regards to the right knee, we have discussed repeat cortisone injection for which she is agreeable to.  In regards to the left parascapular pain, her symptoms are diffuse so I do not think proceeding with trigger point injection will significantly help.  We have discussed trying a muscle relaxer at night as this could cause drowsiness.  I have also discussed adding formal physical therapy for which she is agreeable to.  Follow-up as needed.  Follow-Up Instructions: Return if symptoms worsen or fail to improve.   Orders:  Orders Placed This Encounter  Procedures   Large Joint Inj: R knee   XR Shoulder Left   Ambulatory referral to Physical Therapy   Meds ordered this encounter  Medications   methocarbamol (ROBAXIN) 500 MG tablet    Sig: Take 1 tablet (500 mg total) by mouth at bedtime as needed.    Dispense:  20 tablet    Refill:  0      Procedures: Large Joint Inj: R knee on 09/11/2024 10:35 AM Indications: pain Details: 22 G needle, anterolateral approach Medications: 2 mL lidocaine  1 %; 2 mL bupivacaine  0.25 %; 40 mg methylPREDNISolone  acetate 40 MG/ML      Clinical Data: Ferguson additional findings.   Subjective: Chief Complaint  Patient presents with   Right Knee - Pain   Left Shoulder - Pain    HPI patient is a pleasant 89 year old female who comes in today with recurrent right knee pain as well as new onset left shoulder blade pain.  In regards to the right knee, history of underlying osteoarthritis.   She underwent right knee cortisone injection in April which helped wonderfully until about a month ago.  Her pain has returned.  Pain is to the entire knee and is worse with activity.  She denies any pain at rest.  Other issue she brings up today is left parascapular pain.  Symptoms began about 2 weeks ago were initially constant but have now become intermittent.  Symptoms only occur when she is moving her arm in certain directions.  She denies any pain into the neck or left upper extremity.  When she has the pain, it primarily occurs when she is hugging someone.  She denies any weakness or paresthesias to the left upper extremity.  She has not been to physical therapy for this but does note she has a referral in for [facility] for what sounds like total body deconditioning.  Review of Systems as detailed in HPI.  All others reviewed and are negative.   Objective: Vital Signs: There were Ferguson vitals taken for this visit.  Physical Exam well-developed well-nourished female in Ferguson acute distress.  Alert and oriented x 3.  Ortho Exam right knee exam: Range of motion 0 to 115 degrees.  Moderate patellofemoral crepitus.  Medial and lateral joint line tenderness.  She is neurovascular intact distally.  Left shoulder  exam is unremarkable.  Left cervical spine exam is unremarkable.  She does have moderate tenderness along the parascapular border.  She is neurovascularly intact distally.  Ferguson focal weakness.  Specialty Comments:  Ferguson specialty comments available.  Imaging: XR Shoulder Left Result Date: 09/11/2024 Moderate degenerative changes to the [facility] joint.  Mild degenerative changes glenohumeral joint.    PMFS History: Patient Active Problem List   Diagnosis Date Noted   Palpitations 09/02/2024   Impaired mobility and activities of daily living 09/02/2024   Cramping of hands 09/02/2024   Acute pain of left shoulder 08/10/2024   Class 2 obesity due to excess calories with body mass index (BMI) of 36.0  to 36.9 in adult 08/10/2024   Chest discomfort 08/10/2024   Impacted cerumen of right ear 02/21/2024   Epistaxis 02/20/2024   Localized osteoarthritis of right knee 02/20/2024   Otalgia of right ear 01/10/2024   Right-sided tinnitus 01/10/2024   Encounter for general adult medical examination w/o abnormal findings 08/24/2023   Abnormal glucose 08/24/2023   Primary insomnia 08/24/2023   OSA (obstructive sleep apnea) 08/24/2023   Pain due to onychomycosis of toenails of both feet 06/08/2023   Porokeratosis 06/08/2023   Stage 3a chronic kidney disease (HCC) 02/16/2023   Hypertensive heart and renal disease 02/16/2023   Aortic atherosclerosis 02/16/2023   Degenerative lumbar spinal stenosis 03/03/2021   Lumbar radiculitis 03/03/2021   Lumbago with sciatica, right side 08/12/2020   PAD (peripheral artery disease) 09/06/2019   Essential hypertension, benign 01/04/2019   Estrogen deficiency 01/04/2019   Class 2 severe obesity due to excess calories with serious comorbidity and body mass index (BMI) of 36.0 to 36.9 in adult 01/04/2019   Degeneration of lumbosacral intervertebral disc 07/31/2018   Past Medical History:  Diagnosis Date   Allergy    Arthritis    Chronic kidney disease    GERD (gastroesophageal reflux disease)    Heart murmur    Hypertension    Neuromuscular disorder (HCC)    Peripheral vascular disease    Sleep apnea    Vitamin D deficiency disease     Family History  Problem Relation Age of Onset   Hypertension Mother    Throat cancer Mother    Prostate cancer Father    Tremor Father    Hypertension Sister    Diabetes Sister    Hypertension Brother    Hypertension Sister    Diabetes Sister    Hypertension Sister     Past Surgical History:  Procedure Laterality Date   ABDOMINAL HYSTERECTOMY     ESOPHAGOGASTRODUODENOSCOPY (EGD) WITH PROPOFOL  N/A 01/11/2022   Procedure: ESOPHAGOGASTRODUODENOSCOPY (EGD) WITH PROPOFOL ;  Surgeon: Therisa Bi, MD;  Location:  [facility] ENDOSCOPY;  Service: Gastroenterology;  Laterality: N/A;   EYE SURGERY     HEMORRHOID SURGERY     Social History   Occupational History   Occupation: retired  Tobacco Use   Smoking status: Former    Current packs/day: 0.00    Average packs/day: 1 pack/day for 20.0 years (20.0 ttl pk-yrs)    Types: Cigarettes    Start date: 54    Quit date: 2004    Years since quitting: 21.7   Smokeless tobacco: Never   Tobacco comments:    n/a  Vaping Use   Vaping status: Never Used  Substance and Sexual Activity   Alcohol use: Ferguson   Drug use: Ferguson   Sexual activity: Not Currently

## 2024-09-25 ENCOUNTER — Ambulatory Visit: Admitting: Physical Therapy

## 2024-09-25 ENCOUNTER — Ambulatory Visit

## 2024-09-25 ENCOUNTER — Encounter: Payer: Self-pay | Admitting: Physical Therapy

## 2024-09-25 DIAGNOSIS — R293 Abnormal posture: Secondary | ICD-10-CM

## 2024-09-25 DIAGNOSIS — M6281 Muscle weakness (generalized): Secondary | ICD-10-CM | POA: Diagnosis not present

## 2024-09-25 DIAGNOSIS — M25512 Pain in left shoulder: Secondary | ICD-10-CM | POA: Diagnosis not present

## 2024-09-25 NOTE — Therapy (Signed)
 OUTPATIENT PHYSICAL THERAPY EVALUATION   Patient Name: Tricia Ferguson MRN: [id number] DOB:[birth year], 89 y.o., female Today's Date: 09/25/2024  END OF SESSION:  PT End of Session - 09/25/24 1158     Visit Number 1    Number of Visits 6    Date for Recertification  11/06/24    Authorization Type BCBS Medicare    Progress Note Due on Visit 10    PT Start Time 1100    PT Stop Time 1138    PT Time Calculation (min) 38 min    Activity Tolerance Patient tolerated treatment well    Behavior During Therapy WFL for tasks assessed/performed          Past Medical History:  Diagnosis Date   Allergy    Arthritis    Chronic kidney disease    GERD (gastroesophageal reflux disease)    Heart murmur    Hypertension    Neuromuscular disorder (HCC)    Peripheral vascular disease    Sleep apnea    Vitamin D deficiency disease    Past Surgical History:  Procedure Laterality Date   ABDOMINAL HYSTERECTOMY     ESOPHAGOGASTRODUODENOSCOPY (EGD) WITH PROPOFOL  N/A 01/11/2022   Procedure: ESOPHAGOGASTRODUODENOSCOPY (EGD) WITH PROPOFOL ;  Surgeon: Therisa Bi, MD;  Location: [facility] ENDOSCOPY;  Service: Gastroenterology;  Laterality: N/A;   EYE SURGERY     HEMORRHOID SURGERY     Patient Active Problem List   Diagnosis Date Noted   Palpitations 09/02/2024   Impaired mobility and activities of daily living 09/02/2024   Cramping of hands 09/02/2024   Acute pain of left shoulder 08/10/2024   Class 2 obesity due to excess calories with body mass index (BMI) of 36.0 to 36.9 in adult 08/10/2024   Chest discomfort 08/10/2024   Impacted cerumen of right ear 02/21/2024   Epistaxis 02/20/2024   Localized osteoarthritis of right knee 02/20/2024   Otalgia of right ear 01/10/2024   Right-sided tinnitus 01/10/2024   Encounter for general adult medical examination w/o abnormal findings 08/24/2023   Abnormal glucose 08/24/2023   Primary insomnia 08/24/2023   OSA (obstructive sleep apnea)  08/24/2023   Pain due to onychomycosis of toenails of both feet 06/08/2023   Porokeratosis 06/08/2023   Stage 3a chronic kidney disease (HCC) 02/16/2023   Hypertensive heart and renal disease 02/16/2023   Aortic atherosclerosis 02/16/2023   Degenerative lumbar spinal stenosis 03/03/2021   Lumbar radiculitis 03/03/2021   Lumbago with sciatica, right side 08/12/2020   PAD (peripheral artery disease) 09/06/2019   Essential hypertension, benign 01/04/2019   Estrogen deficiency 01/04/2019   Class 2 severe obesity due to excess calories with serious comorbidity and body mass index (BMI) of 36.0 to 36.9 in adult 01/04/2019   Degeneration of lumbosacral intervertebral disc 07/31/2018    PCP: Jarold Medici, MD  REFERRING PROVIDER: Jule Ronal CROME, PA-C  REFERRING DIAG: [phone] (ICD-10-CM) - Chronic left shoulder pain  Rationale for Evaluation and Treatment: Rehabilitation  THERAPY DIAG:  Acute pain of left shoulder - Plan: PT plan of care cert/re-cert  Abnormal posture - Plan: PT plan of care cert/re-cert  Muscle weakness (generalized) - Plan: PT plan of care cert/re-cert  ONSET DATE: 1 month ago   SUBJECTIVE:  SUBJECTIVE STATEMENT:  Pt is an 89 y/o female who presents to OPPT for left parascapular pain. Symptoms began about 1 month ago were initially constant but have now become intermittent. Symptoms only occur when she is moving her arm in certain directions. She denies any pain into the neck or left upper extremity. She denies any specific injury.   PERTINENT HISTORY:  OA, HTN, PVD, OSA, obesity, spinal stenosis, CKD  PAIN:  Are you having pain? Yes: NPRS scale: 0 currently, up to 7/10 Pain location: Lt shoulder Pain description: sharp, stabbing, aching, sore, dull Aggravating factors:  moving the shoulder, going to hug someone Relieving factors: putting alcohol on it  PRECAUTIONS:  None  RED FLAGS: None   WEIGHT BEARING RESTRICTIONS:  No  FALLS:  Has patient fallen in last 6 months? 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, has some old PT exercises from [employer] that she still does everyday when home  PATIENT GOALS:  Improve pain in shoulder   OBJECTIVE:  Note: Objective measures were completed at Evaluation unless otherwise noted.  DIAGNOSTIC FINDINGS:  X-rays: OA  PATIENT SURVEYS:  Patient-Specific Activity Scoring Scheme  0 represents "unable to perform." 10 represents "able to perform at prior level. 0 1 2 3 4 5 6 7 8 9  10 (Date and Score)   Activity Eval     1. Housework 7    2. Reaching overhead 6    3. Hugging someone 6   Score 6.33    Total score = sum of the activity scores/number of activities Minimum detectable change (90%CI) for average score = 2 points Minimum detectable change (90%CI) for single activity score = 3 points    COGNITIVE STATUS: Within functional limits for tasks assessed   SENSATION: WFL  POSTURE:  rounded shoulders, forward head, and flexed trunk   HAND DOMINANCE:  Right  GAIT: 09/25/24 Comments: amb with SPC and flexed trunk, decreased gait velocity  PALPATION: 09/25/24 trigger points in Lt infraspinatus and rhomboid  UPPER EXTREMITY ROM:  ROM (Active) (Seated) Right eval Left eval  Shoulder flexion 140 139  Shoulder abduction 146 145  Shoulder internal rotation Rt iliac  crest L3  Shoulder  external rotation T2 T2   (Blank rows = not tested)   UPPER EXTREMITY MMT:  MMT Right eval Left eval  Shoulder flexion 3/5 3/5  Shoulder abduction 3/5 3/5  Shoulder internal rotation 3+/5 4/5  Shoulder external rotation 3/5 3/5   (Blank rows = not tested)    SPECIAL TESTS:  09/25/24 Upper Extremity Rotator cuff assessment: Empty can test: positive  and Full can test: positive  (bil and pain bil)  FUNCTIONAL TESTS:  09/25/24 unable to rise from a chair without UE support    TREATMENT:  DATE:  09/25/24 TherEx See HEP - demonstrated with trial reps performed PRN, min cues for comprehension; put tennis ball inside stockinette so pt can throw over her shoulder  Self Care Educated on PT POC and clinical findings     PATIENT EDUCATION:  Education details: HEP Person educated: Patient Education method: Programmer, multimedia, Facilities manager, and Handouts Education comprehension: verbalized understanding, returned demonstration, and needs further education  HOME EXERCISE PROGRAM: Access Code: [facility] URL: [email or web address] Date: 09/25/2024 Prepared by: Corean Ku  Exercises - Standing Shoulder Posterior Capsule Stretch  - 2 x daily - 7 x weekly - 1 sets - 1 reps - 30 sec hold - Seated Scapular Retraction  - 2 x daily - 7 x weekly - 1 sets - 10 reps - 5 sec hold - Standing Infraspinatus/Teres Minor Release with Ball at [employer] (Mirrored)  - 2-3 x daily - 7 x weekly - 1 sets - 1 reps - 2-3 min hold   ASSESSMENT:  CLINICAL IMPRESSION: Patient is a 89 y.o. female who was seen today for physical therapy evaluation and treatment for Lt shoulder pain. She demonstrates decreased strength and continued pain with postural abnormalities affecting functional mobility.  She will benefit from PT to address deficits listed.     OBJECTIVE  IMPAIRMENTS: decreased ROM, decreased strength, increased fascial restrictions, increased muscle spasms, postural dysfunction, and pain.   ACTIVITY LIMITATIONS: carrying, lifting, reach over head, and locomotion level  PARTICIPATION LIMITATIONS: meal prep, cleaning, driving, shopping, and community activity  PERSONAL FACTORS: Age, Behavior pattern, Past/current experiences, Time since onset of injury/illness/exacerbation, and 3+ comorbidities: OA, HTN, PVD, OSA, obesity, spinal stenosis, CKD are also affecting patient's functional outcome.   REHAB POTENTIAL: Good  CLINICAL DECISION MAKING: Evolving/moderate complexity  EVALUATION COMPLEXITY: Moderate   GOALS: Goals reviewed with patient? Yes  SHORT TERM GOALS: Target date: 10/16/2024  Independent with initial HEP Goal status: INITIAL   LONG TERM GOALS: Target date: 11/06/2024  Independent with final HEP Goal status: INITIAL  2.  PSFS score improved by 2 points Goal status: INITIAL  3.  Lt shoulder strength improved to at least 3+/5 for improved function Goal status: INIITAL  4.  Report pain < 3/10 with hugging someone for improved function Goal status: INITIAL    PLAN:  PT FREQUENCY: 1x/week (plan to see every other week, but will see up to 1x/wk if needed)  PT DURATION: 6 weeks  PLANNED INTERVENTIONS: 97164- PT Re-evaluation, 97750- Physical Performance Testing, 97110-Therapeutic exercises, 97530- Therapeutic activity, [id number]- Neuromuscular re-education, 97535- Self Care, [id number]- Manual therapy, [id number]- Aquatic Therapy, H9716- Electrical stimulation (unattended), 97016- Vasopneumatic device, [id number]- Ultrasound, [id number]- Ionotophoresis 4mg /ml Dexamethasone, Patient/Family education, Balance training, Taping, Joint mobilization, Cryotherapy, and Moist heat.  PLAN FOR NEXT SESSION: Review HEP, work on shoulder strengthening, possible trial of percussive device to trigger point   NEXT MD VISIT: PRN   Corean JULIANNA Ku,  PT, DPT 09/25/24 12:00 PM

## 2024-09-26 ENCOUNTER — Ambulatory Visit

## 2024-09-26 VITALS — BP 138/70 | HR 94 | Temp 98.6°F | Ht 60.0 in | Wt 194.2 lb

## 2024-09-26 DIAGNOSIS — Z0000 Encounter for general adult medical examination without abnormal findings: Secondary | ICD-10-CM

## 2024-09-26 NOTE — Progress Notes (Signed)
 Subjective:   Tricia Ferguson is a 89 y.o. who presents for a Medicare Wellness preventive visit.  As a reminder, Annual Wellness Visits don't include a physical exam, and some assessments may be limited, especially if this visit is performed virtually. We may recommend an in-person follow-up visit with your provider if needed.  Visit Complete: In person    Persons Participating in Visit: Patient.  AWV Questionnaire: No: Patient Medicare AWV questionnaire was not completed prior to this visit.  Cardiac Risk Factors include: advanced age (>52men, >4 women);hypertension     Objective:    Today's Vitals   09/26/24 1537 09/26/24 1539 09/26/24 1559  BP: (!) 144/80  138/70  Pulse: 94    Temp: 98.6 F (37 C)    TempSrc: Oral    SpO2: 96%    Weight: 194 lb 3.2 oz (88.1 kg)    Height: 5' (1.524 m)    PainSc:  7     Body mass index is 37.93 kg/m.     09/26/2024    3:49 PM 09/25/2024   11:01 AM 09/14/2023    9:12 AM 09/08/2022    9:17 AM 01/11/2022    9:13 AM 08/13/2021   11:34 AM 07/16/2020    9:42 AM  Advanced Directives  Does Patient Have a Medical Advance Directive? No No No No No No No  Would patient like information on creating a medical advance directive? No - Patient declined Yes (MAU/Ambulatory/Procedural Areas - Information given) No - Patient declined No - Patient declined  No - Patient declined No - Patient declined    Current Medications (verified) Outpatient Encounter Medications as of 09/26/2024  Medication Sig   Acetaminophen  (TYLENOL  PO) Take by mouth.   Ascorbic Acid (VITAMIN C PO) Take by mouth.   calcium carbonate (TUMS - DOSED IN MG ELEMENTAL CALCIUM) 500 MG chewable tablet Chew 2 tablets by mouth daily.    Cholecalciferol (VITAMIN D3 PO) 1 cap PO QD   Ciclopirox  0.77 % gel Apply 1 Application topically 2 (two) times daily.   Garlic 1000 MG CAPS Take by mouth.   glucosamine-chondroitin 500-400 MG tablet Take 1 tablet by mouth 3 (three) times  daily.   levocetirizine (XYZAL ) 5 MG tablet TAKE 1 TABLET(5 MG) BY MOUTH EVERY EVENING   lisinopril  (ZESTRIL ) 5 MG tablet TAKE 1 TABLET(5 MG) BY MOUTH DAILY   methocarbamol (ROBAXIN) 500 MG tablet Take 1 tablet (500 mg total) by mouth at bedtime as needed.   Multiple Vitamin (MULTIVITAMIN) tablet Take 1 tablet by mouth daily.   Multiple Vitamins-Minerals (PRESERVISION AREDS PO) Take by mouth.   Omega-3 Fatty Acids (FISH OIL) 1000 MG CAPS Take 1 tablet by mouth daily.   Chlorphen-PE-Acetaminophen  (NOREL AD) 4-10-325 MG TABS One tab po nightly prn (Patient not taking: Reported on 09/26/2024)   predniSONE  (DELTASONE ) 20 MG tablet Take 2 tabs by mouth daily x 3 days (Patient not taking: Reported on 09/26/2024)   No facility-administered encounter medications on file as of 09/26/2024.    Allergies (verified) Amoxicillin , Penicillins, and Potassium-containing compounds   History: Past Medical History:  Diagnosis Date   Allergy    Arthritis    Chronic kidney disease    GERD (gastroesophageal reflux disease)    Heart murmur    Hypertension    Neuromuscular disorder (HCC)    Peripheral vascular disease    Sleep apnea    Vitamin D deficiency disease    Past Surgical History:  Procedure Laterality Date   ABDOMINAL HYSTERECTOMY  ESOPHAGOGASTRODUODENOSCOPY (EGD) WITH PROPOFOL  N/A 01/11/2022   Procedure: ESOPHAGOGASTRODUODENOSCOPY (EGD) WITH PROPOFOL ;  Surgeon: Therisa Bi, MD;  Location: [facility] ENDOSCOPY;  Service: Gastroenterology;  Laterality: N/A;   EYE SURGERY     HEMORRHOID SURGERY     Family History  Problem Relation Age of Onset   Hypertension Mother    Throat cancer Mother    Prostate cancer Father    Tremor Father    Hypertension Sister    Diabetes Sister    Hypertension Brother    Hypertension Sister    Diabetes Sister    Hypertension Sister    Social History   Socioeconomic History   Marital status: Widowed    Spouse name: Not on file   Number of children: 3    Years of education: Not on file   Highest education level: Not on file  Occupational History   Occupation: retired  Tobacco Use   Smoking status: Former    Current packs/day: 0.00    Average packs/day: 1 pack/day for 20.0 years (20.0 ttl pk-yrs)    Types: Cigarettes    Start date: 58    Quit date: 2004    Years since quitting: 21.8   Smokeless tobacco: Never   Tobacco comments:    n/a  Vaping Use   Vaping status: Never Used  Substance and Sexual Activity   Alcohol use: No   Drug use: No   Sexual activity: Not Currently  Other Topics Concern   Not on file  Social History Narrative   Not on file   Social Drivers of Health   Financial Resource Strain: Low Risk  (09/26/2024)   Overall Financial Resource Strain (CARDIA)    Difficulty of Paying Living Expenses: Not hard at all  Food Insecurity: No Food Insecurity (09/26/2024)   Hunger Vital Sign    Worried About Running Out of Food in the Last Year: Never true    Ran Out of Food in the Last Year: Never true  Transportation Needs: No Transportation Needs (09/26/2024)   PRAPARE - Administrator, Civil Service (Medical): No    Lack of Transportation (Non-Medical): No  Physical Activity: Insufficiently Active (09/26/2024)   Exercise Vital Sign    Days of Exercise per Week: 7 days    Minutes of Exercise per Session: 20 min  Stress: No Stress Concern Present (09/26/2024)   [employer] of Occupational Health - Occupational Stress Questionnaire    Feeling of Stress: Not at all  Social Connections: Moderately Integrated (09/26/2024)   Social Connection and Isolation Panel    Frequency of Communication with Friends and Family: More than three times a week    Frequency of Social Gatherings with Friends and Family: Twice a week    Attends Religious Services: More than 4 times per year    Active Member of [employer] or Organizations: Yes    Attends Banker Meetings: More than 4 times per year    Marital  Status: Widowed    Tobacco Counseling Counseling given: Not Answered Tobacco comments: n/a    Clinical Intake:  Pre-visit preparation completed: Yes  Pain : 0-10 Pain Score: 7  Pain Type: Chronic pain Pain Location: Knee Pain Orientation: Right Pain Descriptors / Indicators: Aching Pain Onset: More than a month ago Pain Frequency: Constant     Nutritional Status: BMI > 30  Obese Nutritional Risks: None Diabetes: No  Lab Results  Component Value Date   HGBA1C 5.1 08/24/2023   HGBA1C 5.2 08/17/2022  HGBA1C 5.4 07/16/2020     How often do you need to have someone help you when you read instructions, pamphlets, or other written materials from your doctor or pharmacy?: 1 - Never  Interpreter Needed?: No  Information entered by :: NAllen LPN   Activities of Daily Living     09/26/2024    3:41 PM  In your present state of health, do you have any difficulty performing the following activities:  Hearing? 0  Vision? 0  Difficulty concentrating or making decisions? 1  Comment sometimes forgetful  Walking or climbing stairs? 1  Dressing or bathing? 0  Doing errands, shopping? 0  Preparing Food and eating ? N  Using the Toilet? N  In the past six months, have you accidently leaked urine? Y  Do you have problems with loss of bowel control? N  Managing your Medications? N  Managing your Finances? N  Housekeeping or managing your Housekeeping? N    Patient Care Team: Jarold Medici, MD as PCP - General (Internal Medicine) Patrcia Sharper, MD as Consulting Physician (Ophthalmology)  I have updated your Care Teams any recent Medical Services you may have received from other providers in the past year.     Assessment:   This is a routine wellness examination for [place].  Hearing/Vision screen Hearing Screening - Comments:: Denies hearing issues Vision Screening - Comments:: Regular eye exams, Dr. Tobie, Dr. Patrcia   Goals Addressed             This  Visit's Progress    Patient Stated       09/26/2024, wants right leg to get better       Depression Screen     09/26/2024    3:50 PM 08/27/2024   10:53 AM 02/20/2024    2:38 PM 09/14/2023    9:14 AM 08/24/2023   10:49 AM 02/15/2023   10:36 AM 09/08/2022    9:18 AM  PHQ 2/9 Scores  PHQ - 2 Score 0 0 0 0 0 0 0  PHQ- 9 Score 0 0 0 0 0      Fall Risk     09/26/2024    3:50 PM 08/27/2024   10:53 AM 02/20/2024    2:38 PM 09/14/2023    9:13 AM 08/24/2023   10:49 AM  Fall Risk   Falls in the past year? 0 0 0 0 0  Number falls in past yr: 0 0 0 0 0  Injury with Fall? 0 0 0 0 0  Risk for fall due to : Medication side effect;Impaired mobility;Impaired balance/gait No Fall Risks No Fall Risks Medication side effect;Impaired mobility;Impaired balance/gait No Fall Risks  Follow up Falls evaluation completed;Falls prevention discussed Falls evaluation completed Falls evaluation completed Falls prevention discussed;Falls evaluation completed Falls evaluation completed    MEDICARE RISK AT HOME:  Medicare Risk at Home Any stairs in or around the home?: No If so, are there any without handrails?: No Home free of loose throw rugs in walkways, pet beds, electrical cords, etc?: Yes Adequate lighting in your home to reduce risk of falls?: Yes Life alert?: No Use of a cane, walker or w/c?: Yes Grab bars in the bathroom?: Yes Shower chair or bench in shower?: No Elevated toilet seat or a handicapped toilet?: Yes  TIMED UP AND GO:  Was the test performed?  Yes  Length of time to ambulate 10 feet: 7 sec Gait slow and steady with assistive device  Cognitive Function: 6CIT completed  09/26/2024    3:51 PM 09/14/2023    9:15 AM 09/08/2022    9:19 AM 08/13/2021   11:37 AM 07/16/2020    9:46 AM  6CIT Screen  What Year? 0 points 0 points 0 points 0 points 0 points  What month? 0 points 0 points 0 points 0 points 0 points  What time? 0 points 0 points 0 points 0 points 0 points  Count back  from 20 0 points 0 points 0 points 0 points 0 points  Months in reverse 0 points 0 points 0 points 0 points 0 points  Repeat phrase 2 points 0 points 0 points 0 points 0 points  Total Score 2 points 0 points 0 points 0 points 0 points    Immunizations Immunization History  Administered Date(s) Administered   Fluad Quad(high Dose 65+) 08/23/2022   Fluad Trivalent(High Dose 65+) 08/24/2023   INFLUENZA, HIGH DOSE SEASONAL PF 09/14/2014, 09/09/2015, 09/13/2016, 09/14/2017   Influenza-Unspecified 08/28/2018, 09/16/2021   PFIZER Comirnaty(Gray Top)Covid-19 Tri-Sucrose Vaccine 09/10/2022   PFIZER(Purple Top)SARS-COV-2 Vaccination 01/19/2020, 02/09/2020, 10/06/2020, 04/02/2021   Pfizer Covid-19 Vaccine Bivalent Booster 5y-11y 10/12/2021   Pfizer(Comirnaty)Fall Seasonal Vaccine 12 years and older 09/13/2023, 08/31/2024   Pneumococcal Conjugate-13 05/16/2015   Pneumococcal Polysaccharide-23 12/21/2021   Tdap 06/27/2019   Zoster Recombinant(Shingrix ) 07/16/2020, 09/13/2020    Screening Tests Health Maintenance  Topic Date Due   COVID-19 Vaccine (9 - Pfizer risk 2025-26 season) 02/28/2025   Medicare Annual Wellness (AWV)  09/26/2025   DTaP/Tdap/Td (2 - Td or Tdap) 06/26/2029   Pneumococcal Vaccine: 50+ Years  Completed   Influenza Vaccine  Completed   DEXA SCAN  Completed   Zoster Vaccines- Shingrix   Completed   Meningococcal B Vaccine  Aged Out    Health Maintenance Items Addressed: Up to date  Additional Screening:  Vision Screening: Recommended annual ophthalmology exams for early detection of glaucoma and other disorders of the eye. Is the patient up to date with their annual eye exam?  Yes  Who is the provider or what is the name of the office in which the patient attends annual eye exams? Dr. Tobie, Dr. Patrcia  Dental Screening: Recommended annual dental exams for proper oral hygiene  Community Resource Referral / Chronic Care Management: CRR required this visit?  No   CCM  required this visit?  No   Plan:    I have personally reviewed and noted the following in the patient's chart:   Medical and social history Use of alcohol, tobacco or illicit drugs  Current medications and supplements including opioid prescriptions. Patient is not currently taking opioid prescriptions. Functional ability and status Nutritional status Physical activity Advanced directives List of other physicians Hospitalizations, surgeries, and ER visits in previous 12 months Vitals Screenings to include cognitive, depression, and falls Referrals and appointments  In addition, I have reviewed and discussed with patient certain preventive protocols, quality metrics, and best practice recommendations. A written personalized care plan for preventive services as well as general preventive health recommendations were provided to patient.   Ardella FORBES Dawn, LPN   [id number]   After Visit Summary: (In Person-Printed) AVS printed and given to the patient  Notes: Nothing significant to report at this time.

## 2024-09-26 NOTE — Patient Instructions (Signed)
 [name],  Thank you for taking the time for your Medicare Wellness Visit. I appreciate your continued commitment to your health goals. Please review the care plan we discussed, and feel free to reach out if I can assist you further.  Medicare recommends these wellness visits once per year to help you and your care team stay ahead of potential health issues. These visits are designed to focus on prevention, allowing your provider to concentrate on managing your acute and chronic conditions during your regular appointments.  Please note that Annual Wellness Visits do not include a physical exam. Some assessments may be limited, especially if the visit was conducted virtually. If needed, we may recommend a separate in-person follow-up with your provider.  Ongoing Care Seeing your primary care provider every 3 to 6 months helps us  monitor your health and provide consistent, personalized care.   Referrals If a referral was made during today's visit and you haven't received any updates within two weeks, please contact the referred provider directly to check on the status.  Recommended Screenings:  Health Maintenance  Topic Date Due   COVID-19 Vaccine (9 - Pfizer risk 2025-26 season) 02/28/2025   Medicare Annual Wellness Visit  09/26/2025   DTaP/Tdap/Td vaccine (2 - Td or Tdap) 06/26/2029   Pneumococcal Vaccine for age over 67  Completed   Flu Shot  Completed   DEXA scan (bone density measurement)  Completed   Zoster (Shingles) Vaccine  Completed   Meningitis B Vaccine  Aged Out       09/26/2024    3:49 PM  Advanced Directives  Does Patient Have a Medical Advance Directive? No  Would patient like information on creating a medical advance directive? No - Patient declined   Advance Care Planning is important because it: Ensures you receive medical care that aligns with your values, goals, and preferences. Provides guidance to your family and loved ones, reducing the emotional burden  of decision-making during critical moments.  Vision: Annual vision screenings are recommended for early detection of glaucoma, cataracts, and diabetic retinopathy. These exams can also reveal signs of chronic conditions such as diabetes and high blood pressure.  Dental: Annual dental screenings help detect early signs of oral cancer, gum disease, and other conditions linked to overall health, including heart disease and diabetes.  Please see the attached documents for additional preventive care recommendations.

## 2024-09-28 DIAGNOSIS — H524 Presbyopia: Secondary | ICD-10-CM | POA: Diagnosis not present

## 2024-10-02 ENCOUNTER — Other Ambulatory Visit: Payer: Self-pay | Admitting: Internal Medicine

## 2024-10-12 ENCOUNTER — Encounter: Payer: Self-pay | Admitting: Physical Therapy

## 2024-10-12 ENCOUNTER — Ambulatory Visit (INDEPENDENT_AMBULATORY_CARE_PROVIDER_SITE_OTHER): Admitting: Physical Therapy

## 2024-10-12 DIAGNOSIS — M25512 Pain in left shoulder: Secondary | ICD-10-CM

## 2024-10-12 DIAGNOSIS — R293 Abnormal posture: Secondary | ICD-10-CM | POA: Diagnosis not present

## 2024-10-12 DIAGNOSIS — M6281 Muscle weakness (generalized): Secondary | ICD-10-CM

## 2024-10-12 NOTE — Therapy (Signed)
 OUTPATIENT PHYSICAL THERAPY TREATMENT   Patient Name: Tricia Ferguson MRN: [id number] DOB:11-08-1934, 89 y.o., female Today's Date: 10/12/2024  END OF SESSION:  PT End of Session - 10/12/24 1102     Visit Number 2    Number of Visits 6    Date for Recertification  11/06/24    Authorization Type BCBS Medicare    Progress Note Due on Visit 10    PT Start Time 1059    PT Stop Time 1140    PT Time Calculation (min) 41 min    Activity Tolerance Patient tolerated treatment well    Behavior During Therapy WFL for tasks assessed/performed           Past Medical History:  Diagnosis Date   Allergy    Arthritis    Chronic kidney disease    GERD (gastroesophageal reflux disease)    Heart murmur    Hypertension    Neuromuscular disorder (HCC)    Peripheral vascular disease    Sleep apnea    Vitamin D deficiency disease    Past Surgical History:  Procedure Laterality Date   ABDOMINAL HYSTERECTOMY     ESOPHAGOGASTRODUODENOSCOPY (EGD) WITH PROPOFOL  N/A 01/11/2022   Procedure: ESOPHAGOGASTRODUODENOSCOPY (EGD) WITH PROPOFOL ;  Surgeon: Therisa Bi, MD;  Location: [facility] ENDOSCOPY;  Service: Gastroenterology;  Laterality: N/A;   EYE SURGERY     HEMORRHOID SURGERY     Patient Active Problem List   Diagnosis Date Noted   Palpitations 09/02/2024   Impaired mobility and activities of daily living 09/02/2024   Cramping of hands 09/02/2024   Acute pain of left shoulder 08/10/2024   Class 2 obesity due to excess calories with body mass index (BMI) of 36.0 to 36.9 in adult 08/10/2024   Chest discomfort 08/10/2024   Impacted cerumen of right ear 02/21/2024   Epistaxis 02/20/2024   Localized osteoarthritis of right knee 02/20/2024   Otalgia of right ear 01/10/2024   Right-sided tinnitus 01/10/2024   Encounter for general adult medical examination w/o abnormal findings 08/24/2023   Abnormal glucose 08/24/2023   Primary insomnia 08/24/2023   OSA (obstructive sleep apnea)  08/24/2023   Pain due to onychomycosis of toenails of both feet 06/08/2023   Porokeratosis 06/08/2023   Stage 3a chronic kidney disease (HCC) 02/16/2023   Hypertensive heart and renal disease 02/16/2023   Aortic atherosclerosis 02/16/2023   Degenerative lumbar spinal stenosis 03/03/2021   Lumbar radiculitis 03/03/2021   Lumbago with sciatica, right side 08/12/2020   PAD (peripheral artery disease) 09/06/2019   Essential hypertension, benign 01/04/2019   Estrogen deficiency 01/04/2019   Class 2 severe obesity due to excess calories with serious comorbidity and body mass index (BMI) of 36.0 to 36.9 in adult 01/04/2019   Degeneration of lumbosacral intervertebral disc 07/31/2018    PCP: Jarold Medici, MD  REFERRING PROVIDER: Jule Ronal CROME, PA-C  REFERRING DIAG: [phone] (ICD-10-CM) - Chronic left shoulder pain  Rationale for Evaluation and Treatment: Rehabilitation  THERAPY DIAG:  Acute pain of left shoulder  Muscle weakness (generalized)  Abnormal posture  ONSET DATE: 1 month ago   SUBJECTIVE:  SUBJECTIVE STATEMENT:  Feels like shoulder pain is improving.  No pain in shoulder today.  PERTINENT HISTORY:  OA, HTN, PVD, OSA, obesity, spinal stenosis, CKD  PAIN:  Are you having pain? Yes: NPRS scale: 0 currently, up to 5/10 Pain location: Lt shoulder Pain description: sharp, stabbing, aching, sore, dull Aggravating factors: moving the shoulder, going to hug someone Relieving factors: putting alcohol on it  PRECAUTIONS:  None  RED FLAGS: None   WEIGHT BEARING RESTRICTIONS:  No  FALLS:  Has patient fallen in last 6 months? 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, has some old PT exercises from [employer] that she still does everyday when home  PATIENT GOALS:  Improve pain in shoulder   OBJECTIVE:  Note: Objective measures were completed at Evaluation unless otherwise noted.  DIAGNOSTIC FINDINGS:  X-rays: OA  PATIENT SURVEYS:  Patient-Specific Activity Scoring Scheme  0 represents "unable to perform." 10 represents "able to perform at prior level. 0 1 2 3 4 5 6 7 8 9  10 (Date and Score)   Activity Eval     1. Housework 7    2. Reaching overhead 6    3. Hugging someone 6   Score 6.33    Total score = sum of the activity scores/number of activities Minimum detectable change (90%CI) for average score = 2 points Minimum detectable change (90%CI) for single activity score = 3 points    COGNITIVE STATUS: Within functional limits for tasks assessed   SENSATION: WFL  POSTURE:  rounded shoulders, forward head, and flexed trunk   HAND DOMINANCE:  Right  GAIT: 09/25/24 Comments: amb with SPC and flexed trunk, decreased gait velocity  PALPATION: 09/25/24 trigger points in Lt infraspinatus and rhomboid  UPPER EXTREMITY ROM:  ROM (Active) (Seated) Right eval Left eval  Shoulder flexion 140 139  Shoulder abduction 146 145  Shoulder internal rotation Rt iliac  crest L3  Shoulder external rotation T2 T2   (Blank rows = not tested)   UPPER EXTREMITY MMT:  MMT Right eval Left eval  Shoulder flexion 3/5 3/5  Shoulder abduction 3/5 3/5  Shoulder internal rotation 3+/5 4/5  Shoulder external rotation 3/5 3/5   (Blank rows = not tested)    SPECIAL TESTS:  09/25/24 Upper Extremity Rotator cuff assessment: Empty can test:  positive  and Full can test: positive  (bil and pain bil)  FUNCTIONAL TESTS:  09/25/24 unable to rise from a chair without UE support    TREATMENT:  DATE:  10/12/24 Seated posterior capsule stretch 3x30 sec on Lt Seated scapular retraction x10 reps; 5 sec hold Seated ER (limited range) with scapular retraction 2x10; 5 sec hold; L1 band Seated horizontal abduction L1 band 2x10 Seated Lt IR L1 band 2x10 Seated Lt upper trap stretch 3x30 sec Supine protraction 3# on Lt; 2x10 Supine shoulder flexion to 90 deg on Lt; 3#  IASTM with Percussive device to Lt posterior shoulder muscles x 5 min     09/25/24 TherEx See HEP - demonstrated with trial reps performed PRN, min cues for comprehension; put tennis ball inside stockinette so pt can throw over her shoulder  Self Care Educated on PT POC and clinical findings     PATIENT EDUCATION:  Education details: HEP Person educated: Patient Education method: Programmer, Multimedia, Facilities Manager, and Handouts Education comprehension: verbalized understanding, returned demonstration, and needs further education  HOME EXERCISE PROGRAM: Access Code: [facility] URL: [email or web address] Date: 09/25/2024 Prepared by: Corean Ku  Exercises - Standing Shoulder Posterior Capsule Stretch  - 2 x daily - 7 x weekly - 1 sets - 1 reps - 30 sec hold - Seated Scapular Retraction  - 2 x daily - 7 x weekly - 1 sets - 10 reps - 5 sec hold - Standing Infraspinatus/Teres Minor Release with Ball at [employer] (Mirrored)  - 2-3 x daily - 7 x weekly - 1 sets - 1 reps - 2-3 min hold   ASSESSMENT:  CLINICAL IMPRESSION: Pt tolerated session well today reporting decreased pain.  Plan to update HEP next visit if not too sore from today.  Continue skilled PT, pain continues to improve.   OBJECTIVE IMPAIRMENTS: decreased ROM,  decreased strength, increased fascial restrictions, increased muscle spasms, postural dysfunction, and pain.   ACTIVITY LIMITATIONS: carrying, lifting, reach over head, and locomotion level  PARTICIPATION LIMITATIONS: meal prep, cleaning, driving, shopping, and community activity  PERSONAL FACTORS: Age, Behavior pattern, Past/current experiences, Time since onset of injury/illness/exacerbation, and 3+ comorbidities: OA, HTN, PVD, OSA, obesity, spinal stenosis, CKD are also affecting patient's functional outcome.   REHAB POTENTIAL: Good  CLINICAL DECISION MAKING: Evolving/moderate complexity  EVALUATION COMPLEXITY: Moderate   GOALS: Goals reviewed with patient? Yes  SHORT TERM GOALS: Target date: 10/16/2024  Independent with initial HEP Goal status: MET 10/12/24   LONG TERM GOALS: Target date: 11/06/2024  Independent with final HEP Goal status: INITIAL  2.  PSFS score improved by 2 points Goal status: INITIAL  3.  Lt shoulder strength improved to at least 3+/5 for improved function Goal status: INIITAL  4.  Report pain < 3/10 with hugging someone for improved function Goal status: INITIAL    PLAN:  PT FREQUENCY: 1x/week (plan to see every other week, but will see up to 1x/wk if needed)  PT DURATION: 6 weeks  PLANNED INTERVENTIONS: 97164- PT Re-evaluation, 97750- Physical Performance Testing, 97110-Therapeutic exercises, 97530- Therapeutic activity, 97112- Neuromuscular re-education, 97535- Self Care, [id number]- Manual therapy, [id number]- Aquatic Therapy, H9716- Electrical stimulation (unattended), 97016- Vasopneumatic device, [id number]- Ultrasound, [id number]- Ionotophoresis 4mg /ml Dexamethasone, Patient/Family education, Balance training, Taping, Joint mobilization, Cryotherapy, and Moist heat.  PLAN FOR NEXT SESSION: update HEP (add stengthening), work on shoulder strengthening, repeat percussive device   NEXT MD VISIT: PRN   Corean JULIANNA Ku, PT, DPT 10/12/24 11:44 AM

## 2024-10-15 ENCOUNTER — Encounter: Payer: Self-pay | Admitting: Radiology

## 2024-10-15 DIAGNOSIS — H353221 Exudative age-related macular degeneration, left eye, with active choroidal neovascularization: Secondary | ICD-10-CM | POA: Diagnosis not present

## 2024-10-18 DIAGNOSIS — K080 Exfoliation of teeth due to systemic causes: Secondary | ICD-10-CM | POA: Diagnosis not present

## 2024-10-22 DIAGNOSIS — H353221 Exudative age-related macular degeneration, left eye, with active choroidal neovascularization: Secondary | ICD-10-CM | POA: Diagnosis not present

## 2024-10-22 DIAGNOSIS — H353111 Nonexudative age-related macular degeneration, right eye, early dry stage: Secondary | ICD-10-CM | POA: Diagnosis not present

## 2024-10-22 DIAGNOSIS — H35363 Drusen (degenerative) of macula, bilateral: Secondary | ICD-10-CM | POA: Diagnosis not present

## 2024-10-23 ENCOUNTER — Encounter: Admitting: Physical Therapy

## 2024-11-05 ENCOUNTER — Ambulatory Visit: Admitting: Physical Therapy

## 2024-11-05 ENCOUNTER — Encounter: Payer: Self-pay | Admitting: Physical Therapy

## 2024-11-05 DIAGNOSIS — R293 Abnormal posture: Secondary | ICD-10-CM

## 2024-11-05 DIAGNOSIS — M6281 Muscle weakness (generalized): Secondary | ICD-10-CM

## 2024-11-05 DIAGNOSIS — M25512 Pain in left shoulder: Secondary | ICD-10-CM | POA: Diagnosis not present

## 2024-11-05 NOTE — Therapy (Signed)
 OUTPATIENT PHYSICAL THERAPY TREATMENT RECERTIFICATION   Patient Name: Tricia Ferguson MRN: [id number] DOB:June 18, 1934, 89 y.o., female Today's Date: 11/05/2024  END OF SESSION:  PT End of Session - 11/05/24 1143     Visit Number 3    Number of Visits 5    Date for Recertification  12/03/24    Authorization Type BCBS Medicare    Progress Note Due on Visit 10    PT Start Time 1140    PT Stop Time 1220    PT Time Calculation (min) 40 min    Activity Tolerance Patient tolerated treatment well    Behavior During Therapy WFL for tasks assessed/performed            Past Medical History:  Diagnosis Date   Allergy    Arthritis    Chronic kidney disease    GERD (gastroesophageal reflux disease)    Heart murmur    Hypertension    Neuromuscular disorder (HCC)    Peripheral vascular disease    Sleep apnea    Vitamin D deficiency disease    Past Surgical History:  Procedure Laterality Date   ABDOMINAL HYSTERECTOMY     ESOPHAGOGASTRODUODENOSCOPY (EGD) WITH PROPOFOL  N/A 01/11/2022   Procedure: ESOPHAGOGASTRODUODENOSCOPY (EGD) WITH PROPOFOL ;  Surgeon: Therisa Bi, MD;  Location: [facility] ENDOSCOPY;  Service: Gastroenterology;  Laterality: N/A;   EYE SURGERY     HEMORRHOID SURGERY     Patient Active Problem List   Diagnosis Date Noted   Palpitations 09/02/2024   Impaired mobility and activities of daily living 09/02/2024   Cramping of hands 09/02/2024   Acute pain of left shoulder 08/10/2024   Class 2 obesity due to excess calories with body mass index (BMI) of 36.0 to 36.9 in adult 08/10/2024   Chest discomfort 08/10/2024   Impacted cerumen of right ear 02/21/2024   Epistaxis 02/20/2024   Localized osteoarthritis of right knee 02/20/2024   Otalgia of right ear 01/10/2024   Right-sided tinnitus 01/10/2024   Encounter for general adult medical examination w/o abnormal findings 08/24/2023   Abnormal glucose 08/24/2023   Primary insomnia 08/24/2023   OSA (obstructive  sleep apnea) 08/24/2023   Pain due to onychomycosis of toenails of both feet 06/08/2023   Porokeratosis 06/08/2023   Stage 3a chronic kidney disease (HCC) 02/16/2023   Hypertensive heart and renal disease 02/16/2023   Aortic atherosclerosis 02/16/2023   Degenerative lumbar spinal stenosis 03/03/2021   Lumbar radiculitis 03/03/2021   Lumbago with sciatica, right side 08/12/2020   PAD (peripheral artery disease) 09/06/2019   Essential hypertension, benign 01/04/2019   Estrogen deficiency 01/04/2019   Class 2 severe obesity due to excess calories with serious comorbidity and body mass index (BMI) of 36.0 to 36.9 in adult 01/04/2019   Degeneration of lumbosacral intervertebral disc 07/31/2018    PCP: Jarold Medici, MD  REFERRING PROVIDER: Jule Ronal CROME, PA-C  REFERRING DIAG: [phone] (ICD-10-CM) - Chronic left shoulder pain  Rationale for Evaluation and Treatment: Rehabilitation  THERAPY DIAG:  Acute pain of left shoulder - Plan: PT plan of care cert/re-cert  Muscle weakness (generalized) - Plan: PT plan of care cert/re-cert  Abnormal posture - Plan: PT plan of care cert/re-cert  ONSET DATE: 1 month ago   SUBJECTIVE:  SUBJECTIVE STATEMENT:  Pain is much better, feels some soreness.  No more pain when giving someone a hug.  PERTINENT HISTORY:  OA, HTN, PVD, OSA, obesity, spinal stenosis, CKD  PAIN:  Are you having pain? Yes: NPRS scale: 0/10 Pain location: Lt shoulder Pain description: sharp, stabbing, aching, sore, dull Aggravating factors: moving the shoulder, going to hug someone Relieving factors: putting alcohol on it  PRECAUTIONS:  None  RED FLAGS: None   WEIGHT BEARING RESTRICTIONS:  No  FALLS:  Has patient fallen in last 6 months? 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, has some old PT exercises from [employer] that she still does everyday when home  PATIENT GOALS:  Improve pain in shoulder   OBJECTIVE:  Note: Objective measures were completed at Evaluation unless otherwise noted.  DIAGNOSTIC FINDINGS:  X-rays: OA  PATIENT SURVEYS:  Patient-Specific Activity Scoring Scheme  0 represents "unable to perform." 10 represents "able to perform at prior level. 0 1 2 3 4 5 6 7 8 9  10 (Date and Score)   Activity Eval  11/05/24   1. Housework 7  7  2. Reaching overhead 6  6  3. Hugging someone 6 10  Score 6.33 7.67   Total score = sum of the activity scores/number of activities Minimum detectable change (90%CI) for average score = 2 points Minimum detectable change (90%CI) for single activity score = 3 points    COGNITIVE STATUS: Within functional limits for tasks assessed   SENSATION: WFL  POSTURE:  rounded shoulders, forward head, and flexed trunk   HAND DOMINANCE:  Right  GAIT: 09/25/24 Comments: amb with SPC and flexed trunk, decreased gait velocity  PALPATION: 09/25/24 trigger points in Lt infraspinatus and rhomboid  UPPER EXTREMITY ROM:  ROM (Active) (Seated) Right eval Left eval  Shoulder flexion 140 139  Shoulder abduction 146 145  Shoulder internal rotation Rt iliac  crest L3  Shoulder external rotation T2 T2   (Blank rows = not tested)   UPPER EXTREMITY MMT:  MMT Right eval Left eval Left 11/05/24  Shoulder flexion 3/5 3/5 3/5  Shoulder abduction 3/5 3/5 3/5  Shoulder internal rotation 3+/5 4/5    Shoulder external rotation 3/5 3/5 3/5   (Blank rows = not tested)    SPECIAL TESTS:  09/25/24 Upper Extremity Rotator cuff assessment: Empty can test: positive  and Full can test: positive  (bil and pain bil)  FUNCTIONAL TESTS:  09/25/24 unable to rise from a chair without UE support    TREATMENT:  DATE:  11/05/24 Goal assessment - see below for details Seated shoulder flexion 1# 1x10, then 2# 1x10 on Lt Seated shoulder abduction 2# 2x10 on Lt Seated ER with scapular retraction L2 band 2x10 Seated horizontal abduction L2 band 2x10     10/12/24 Seated posterior capsule stretch 3x30 sec on Lt Seated scapular retraction x10 reps; 5 sec hold Seated ER (limited range) with scapular retraction 2x10; 5 sec hold; L1 band Seated horizontal abduction L1 band 2x10 Seated Lt IR L1 band 2x10 Seated Lt upper trap stretch 3x30 sec Supine protraction 3# on Lt; 2x10 Supine shoulder flexion to 90 deg on Lt; 3#  IASTM with Percussive device to Lt posterior shoulder muscles x 5 min     09/25/24 TherEx See HEP - demonstrated with trial reps performed PRN, min cues for comprehension; put tennis ball inside stockinette so pt can throw over her shoulder  Self Care Educated on PT POC and clinical findings     PATIENT EDUCATION:  Education details: HEP Person educated: Patient Education method: Programmer, Multimedia, Facilities Manager, and Handouts Education comprehension: verbalized understanding, returned demonstration, and needs further education  HOME EXERCISE PROGRAM: Access Code: [facility] URL: [email or web address] Date: 11/05/2024 Prepared by: Corean Ku  Exercises - Standing Shoulder Posterior Capsule Stretch  - 2 x daily - 7 x weekly - 1 sets - 1 reps - 30 sec hold - Seated Scapular Retraction  - 2 x daily - 7 x weekly - 1 sets - 10 reps -  5 sec hold - Standing Infraspinatus/Teres Minor Release with Ball at [employer] (Mirrored)  - 2-3 x daily - 7 x weekly - 1 sets - 1 reps - 2-3 min hold - Seated Single Arm Shoulder Flexion with Dumbbell (Mirrored)  - 1 x daily - 7 x weekly - 2 sets - 10 reps - Seated Single Arm Shoulder Abduction with Dumbbell - Palm Down  - 1 x daily - 7 x weekly - 2 sets - 10 reps - Shoulder External Rotation and Scapular Retraction with Resistance  - 1 x daily - 7 x weekly - 2 sets - 10 reps - 5 sec hold - Seated Shoulder Horizontal Abduction with Resistance  - 1 x daily - 7 x weekly - 2 sets - 10 reps  ASSESSMENT:  CLINICAL IMPRESSION: Pt has met 1 LTG at this time, and her strength is limiting her at this time.  Updated HEP today to add strengthening exercises.  Plan to see up to 1x/wk x 4 weeks to maximize function.   OBJECTIVE IMPAIRMENTS: decreased ROM, decreased strength, increased fascial restrictions, increased muscle spasms, postural dysfunction, and pain.   ACTIVITY LIMITATIONS: carrying, lifting, reach over head, and locomotion level  PARTICIPATION LIMITATIONS: meal prep, cleaning, driving, shopping, and community activity  PERSONAL FACTORS: Age, Behavior pattern, Past/current experiences, Time since onset of injury/illness/exacerbation, and 3+ comorbidities: OA, HTN, PVD, OSA, obesity, spinal stenosis, CKD are also affecting patient's functional outcome.   REHAB POTENTIAL: Good  CLINICAL DECISION MAKING: Evolving/moderate complexity  EVALUATION COMPLEXITY: Moderate   GOALS: Goals reviewed with patient? Yes  SHORT TERM GOALS: Target date: 10/16/2024  Independent with initial HEP Goal status: MET 10/12/24   LONG TERM GOALS: Target date: 12/03/2024  Independent with final HEP Goal status: ONGOING 11/05/24  2.  PSFS score improved by 2 points Goal status: ONGOING 11/05/24  3.  Lt shoulder strength improved to at least 3+/5 for improved function Goal status: ONGOING 11/05/24  4.   Report pain < 3/10 with hugging someone for  improved function Goal status: MET 11/05/24    PLAN:  PT FREQUENCY: 1x/week (plan to see every other week, but will see up to 1x/wk if needed)  PT DURATION: 4 weeks  PLANNED INTERVENTIONS: 97164- PT Re-evaluation, 97750- Physical Performance Testing, 97110-Therapeutic exercises, 97530- Therapeutic activity, 97112- Neuromuscular re-education, 97535- Self Care, 02859- Manual therapy, [id number]- Aquatic Therapy, H9716- Electrical stimulation (unattended), 97016- Vasopneumatic device, [id number]- Ultrasound, [id number]- Ionotophoresis 4mg /ml Dexamethasone, Patient/Family education, Balance training, Taping, Joint mobilization, Cryotherapy, and Moist heat.  PLAN FOR NEXT SESSION: review updated HEP, continue shoulder strengthening   NEXT MD VISIT: PRN   Corean JULIANNA Ku, PT, DPT 11/05/24 12:49 PM

## 2024-11-20 DIAGNOSIS — K080 Exfoliation of teeth due to systemic causes: Secondary | ICD-10-CM | POA: Diagnosis not present

## 2024-11-21 ENCOUNTER — Ambulatory Visit: Admitting: Rehabilitative and Restorative Service Providers"

## 2024-11-21 ENCOUNTER — Encounter: Payer: Self-pay | Admitting: Rehabilitative and Restorative Service Providers"

## 2024-11-21 DIAGNOSIS — R293 Abnormal posture: Secondary | ICD-10-CM | POA: Diagnosis not present

## 2024-11-21 DIAGNOSIS — M6281 Muscle weakness (generalized): Secondary | ICD-10-CM | POA: Diagnosis not present

## 2024-11-21 DIAGNOSIS — M25512 Pain in left shoulder: Secondary | ICD-10-CM

## 2024-11-21 NOTE — Therapy (Signed)
 OUTPATIENT PHYSICAL THERAPY TREATMENT/DISCHARGE  PHYSICAL THERAPY DISCHARGE SUMMARY  Visits from Start of Care: 4  Current functional level related to goals / functional outcomes: See note   Remaining deficits: See note   Education / Equipment: Updated HEP   Patient agrees to discharge. Patient goals were met. Patient is being discharged due to being pleased with the current functional level.   Patient Name: Tricia Ferguson MRN: [id number] DOB:1934-06-13, 89 y.o., female Today's Date: 11/21/2024  END OF SESSION:  PT End of Session - 11/21/24 1015     Visit Number 4    Number of Visits 5    Date for Recertification  12/03/24    Authorization Type BCBS Medicare    Progress Note Due on Visit 10    PT Start Time 1014    PT Stop Time 1103    PT Time Calculation (min) 49 min    Activity Tolerance Patient tolerated treatment well;No increased pain    Behavior During Therapy WFL for tasks assessed/performed             Past Medical History:  Diagnosis Date   Allergy    Arthritis    Chronic kidney disease    GERD (gastroesophageal reflux disease)    Heart murmur    Hypertension    Neuromuscular disorder (HCC)    Peripheral vascular disease    Sleep apnea    Vitamin D deficiency disease    Past Surgical History:  Procedure Laterality Date   ABDOMINAL HYSTERECTOMY     ESOPHAGOGASTRODUODENOSCOPY (EGD) WITH PROPOFOL  N/A 01/11/2022   Procedure: ESOPHAGOGASTRODUODENOSCOPY (EGD) WITH PROPOFOL ;  Surgeon: Therisa Bi, MD;  Location: [facility] ENDOSCOPY;  Service: Gastroenterology;  Laterality: N/A;   EYE SURGERY     HEMORRHOID SURGERY     Patient Active Problem List   Diagnosis Date Noted   Palpitations 09/02/2024   Impaired mobility and activities of daily living 09/02/2024   Cramping of hands 09/02/2024   Acute pain of left shoulder 08/10/2024   Class 2 obesity due to excess calories with body mass index (BMI) of 36.0 to 36.9 in adult 08/10/2024   Chest  discomfort 08/10/2024   Impacted cerumen of right ear 02/21/2024   Epistaxis 02/20/2024   Localized osteoarthritis of right knee 02/20/2024   Otalgia of right ear 01/10/2024   Right-sided tinnitus 01/10/2024   Encounter for general adult medical examination w/o abnormal findings 08/24/2023   Abnormal glucose 08/24/2023   Primary insomnia 08/24/2023   OSA (obstructive sleep apnea) 08/24/2023   Pain due to onychomycosis of toenails of both feet 06/08/2023   Porokeratosis 06/08/2023   Stage 3a chronic kidney disease (HCC) 02/16/2023   Hypertensive heart and renal disease 02/16/2023   Aortic atherosclerosis 02/16/2023   Degenerative lumbar spinal stenosis 03/03/2021   Lumbar radiculitis 03/03/2021   Lumbago with sciatica, right side 08/12/2020   PAD (peripheral artery disease) 09/06/2019   Essential hypertension, benign 01/04/2019   Estrogen deficiency 01/04/2019   Class 2 severe obesity due to excess calories with serious comorbidity and body mass index (BMI) of 36.0 to 36.9 in adult 01/04/2019   Degeneration of lumbosacral intervertebral disc 07/31/2018    PCP: Jarold Medici, MD  REFERRING PROVIDER: Jule Ronal CROME, PA-C  REFERRING DIAG: [phone] (ICD-10-CM) - Chronic left shoulder pain  Rationale for Evaluation and Treatment: Rehabilitation  THERAPY DIAG:  Acute pain of left shoulder  Muscle weakness (generalized)  Abnormal posture  ONSET DATE: 1 month ago   SUBJECTIVE:  SUBJECTIVE STATEMENT:  Tricia Ferguson notes minimal pain and no functional limitations related to her left shoulder.  PERTINENT HISTORY:  OA, HTN, PVD, OSA, obesity, spinal stenosis, CKD  PAIN:  Are you having pain? Yes: NPRS scale: 0/10 Pain location: Lt shoulder Pain description: sharp, stabbing, aching, sore,  dull Aggravating factors: moving the shoulder, going to hug someone Relieving factors: putting alcohol on it  PRECAUTIONS:  None  RED FLAGS: None   WEIGHT BEARING RESTRICTIONS:  No  FALLS:  Has patient fallen in last 6 months? 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, has some old PT exercises from [employer] that she still does everyday when home  PATIENT GOALS:  Improve pain in shoulder   OBJECTIVE:  Note: Objective measures were completed at Evaluation unless otherwise noted.  DIAGNOSTIC FINDINGS:  X-rays: OA  PATIENT SURVEYS:  Patient-Specific Activity Scoring Scheme  0 represents unable to perform. 10 represents able to perform at prior level. 0 1 2 3 4 5 6 7 8 9  10 (Date and Score)   Activity Eval  11/05/24  11/21/2024  1. Housework 7  7 10   2. Reaching overhead 6  6 7   3. Hugging someone 6 10 10   Score 6.33 7.67 9   Total score = sum of the activity scores/number of activities Minimum detectable change (90%CI) for average score = 2 points Minimum detectable change (90%CI) for single activity score = 3 points    COGNITIVE STATUS: Within functional limits for tasks assessed   SENSATION: WFL  POSTURE:  rounded shoulders, forward head, and flexed trunk   HAND DOMINANCE:  Right  GAIT: 09/25/24 Comments: amb with SPC and flexed trunk, decreased gait velocity  PALPATION: 09/25/24 trigger points in Lt infraspinatus and rhomboid  UPPER EXTREMITY ROM:  ROM (Active) (Seated) Right eval Left eval Left 11/21/2024  Shoulder flexion 140 139 160/   Shoulder abduction 146 145   Shoulder internal rotation Rt iliac  crest L3 50/  Shoulder external rotation T2 T2 100/  Horizontal adduction   45/   (Blank rows = not tested)   UPPER EXTREMITY MMT:  MMT Right eval Left eval Left 11/05/24 Left 11/21/2024  Shoulder flexion 3/5 3/5 3/5   Shoulder abduction 3/5 3/5 3/5   Shoulder internal rotation 3+/5 4/5  20.2/  Shoulder external rotation 3/5 3/5 3/5 11.2/   (Blank rows = not tested)    SPECIAL TESTS:  09/25/24 Upper Extremity Rotator cuff assessment: Empty can test: positive  and Full can test: positive  (bil and pain bil)  FUNCTIONAL TESTS:  09/25/24 unable to rise from a chair without UE support  TREATMENT:  DATE:  11/21/2024 Scapular retraction/shoulder blade pinches 10 x 5 seconds Thera-band ER Red 2 sets of 10 slow eccentrics Thera-band IR Red 10 reps slow eccentrics  [id number]: Completed objective measurements and discussed how this would affect her long-term maintenance home exercise program recommendations; answered questions about transition into independent rehabilitation; recommended Yuvonne follow-up with Dr. Jerri about a possible referral for her knee for physical therapy since she is not a TKA candidate.   11/05/24 Goal assessment - see below for details Seated shoulder flexion 1# 1x10, then 2# 1x10 on Lt Seated shoulder abduction 2# 2x10 on Lt Seated ER with scapular retraction L2 band 2x10 Seated horizontal abduction L2 band 2x10   10/12/24 Seated posterior capsule stretch 3x30 sec on Lt Seated scapular retraction x10 reps; 5 sec hold Seated ER (limited range) with scapular retraction 2x10; 5 sec hold; L1 band Seated horizontal abduction L1 band 2x10 Seated Lt IR L1 band 2x10 Seated Lt upper trap stretch 3x30 sec Supine protraction 3# on Lt; 2x10 Supine shoulder flexion to 90 deg on  Lt; 3#  IASTM with Percussive device to Lt posterior shoulder muscles x 5 min   PATIENT EDUCATION:  Education details: HEP Person educated: Patient Education method: Programmer, Multimedia, Facilities Manager, and Handouts Education comprehension: verbalized understanding, returned demonstration, and needs further education  HOME EXERCISE PROGRAM: Access Code: [facility] URL: [email or web address] Date: 11/05/2024 Prepared by: Corean Ku  Exercises - Standing Shoulder Posterior Capsule Stretch  - 2 x daily - 7 x weekly - 1 sets - 1 reps - 30 sec hold - Seated Scapular Retraction  - 2 x daily - 7 x weekly - 1 sets - 10 reps - 5 sec hold - Standing Infraspinatus/Teres Minor Release with Ball at [employer] (Mirrored)  - 2-3 x daily - 7 x weekly - 1 sets - 1 reps - 2-3 min hold - Seated Single Arm Shoulder Flexion with Dumbbell (Mirrored)  - 1 x daily - 7 x weekly - 2 sets - 10 reps - Seated Single Arm Shoulder Abduction with Dumbbell - Palm Down  - 1 x daily - 7 x weekly - 2 sets - 10 reps - Shoulder External Rotation and Scapular Retraction with Resistance  - 1 x daily - 7 x weekly - 2 sets - 10 reps - 5 sec hold - Seated Shoulder Horizontal Abduction with Resistance  - 1 x daily - 7 x weekly - 2 sets - 10 reps  ASSESSMENT:  CLINICAL IMPRESSION: Tenelle is very happy with her progress with her shoulder physical therapy.  She has demonstrated independence and compliance with her excellent home exercise program.  We reviewed objective findings today and I made sure she was clear on what activities are recommended versus what are optional.  Makenze also expressed interest in possibly returning for some physical therapy for her knee which is very arthritic and not a candidate for a total knee replacement.  Tabathia was a pleasure to work with and is welcome return anytime for reassessment or additional rehabilitation if requested.   OBJECTIVE IMPAIRMENTS: decreased ROM, decreased strength,  increased fascial restrictions, increased muscle spasms, postural dysfunction, and pain.   ACTIVITY LIMITATIONS: carrying, lifting, reach over head, and locomotion level  PARTICIPATION LIMITATIONS: meal prep, cleaning, driving, shopping, and community activity  PERSONAL FACTORS: Age, Behavior pattern, Past/current experiences, Time since onset of injury/illness/exacerbation, and 3+ comorbidities: OA, HTN, PVD, OSA, obesity, spinal stenosis, CKD are also affecting patient's functional outcome.   REHAB POTENTIAL: Good  CLINICAL DECISION MAKING: Evolving/moderate complexity  EVALUATION COMPLEXITY: Moderate   GOALS: Goals reviewed with patient? Yes  SHORT TERM GOALS: Target date: 10/16/2024  Independent with initial HEP Goal status: MET 10/12/24   LONG TERM GOALS: Target date: 12/03/2024  Independent with final HEP Goal status: Met 11/21/2024  2.  PSFS score improved by 2 points Met 11/21/2024   3.  Lt shoulder strength improved to at least 3+/5 for improved function Goal status: Met 11/21/2024  4.  Report pain < 3/10 with hugging someone for improved function Goal status: MET 11/05/24    PLAN:  PT FREQUENCY: DC  PT DURATION: DC  PLANNED INTERVENTIONS: 97164- PT Re-evaluation, 97750- Physical Performance Testing, 97110-Therapeutic exercises, 97530- Therapeutic activity, [id number]- Neuromuscular re-education, 97535- Self Care, [id number]- Manual therapy, [id number]- Aquatic Therapy, [id number]- Electrical stimulation (unattended), 97016- Vasopneumatic device, [id number]- Ultrasound, 02966- Ionotophoresis 4mg /ml Dexamethasone, Patient/Family education, Balance training, Taping, Joint mobilization, Cryotherapy, and Moist heat.  PLAN FOR NEXT SESSION: DC   NEXT MD VISIT: PRN   Myer LELON Ivory PT, MPT 11/21/24 4:51 PM

## 2024-11-28 ENCOUNTER — Encounter: Admitting: Physical Therapy

## 2024-12-25 ENCOUNTER — Other Ambulatory Visit: Payer: Self-pay | Admitting: Internal Medicine

## 2024-12-25 DIAGNOSIS — I1310 Hypertensive heart and chronic kidney disease without heart failure, with stage 1 through stage 4 chronic kidney disease, or unspecified chronic kidney disease: Secondary | ICD-10-CM

## 2025-02-25 ENCOUNTER — Ambulatory Visit: Payer: Self-pay | Admitting: Internal Medicine

## 2025-08-28 ENCOUNTER — Encounter: Payer: Self-pay | Admitting: Internal Medicine
